# Patient Record
Sex: Female | Born: 1945
Health system: Southern US, Community
[De-identification: ages and names within clinical notes are randomized; demographics above are authoritative.]

## PROBLEM LIST (undated history)

## (undated) DIAGNOSIS — Z860100 Personal history of colon polyps, unspecified: Secondary | ICD-10-CM

## (undated) DIAGNOSIS — J449 Chronic obstructive pulmonary disease, unspecified: Secondary | ICD-10-CM

## (undated) DIAGNOSIS — C52 Malignant neoplasm of vagina: Secondary | ICD-10-CM

## (undated) DIAGNOSIS — K589 Irritable bowel syndrome without diarrhea: Secondary | ICD-10-CM

## (undated) DIAGNOSIS — Z720 Tobacco use: Secondary | ICD-10-CM

## (undated) DIAGNOSIS — Z8739 Personal history of other diseases of the musculoskeletal system and connective tissue: Secondary | ICD-10-CM

## (undated) DIAGNOSIS — I2584 Coronary atherosclerosis due to calcified coronary lesion: Secondary | ICD-10-CM

## (undated) DIAGNOSIS — B354 Tinea corporis: Secondary | ICD-10-CM

## (undated) DIAGNOSIS — M1990 Unspecified osteoarthritis, unspecified site: Secondary | ICD-10-CM

## (undated) DIAGNOSIS — I700 Atherosclerosis of aorta: Secondary | ICD-10-CM

## (undated) DIAGNOSIS — M810 Age-related osteoporosis without current pathological fracture: Secondary | ICD-10-CM

## (undated) DIAGNOSIS — C50919 Malignant neoplasm of unspecified site of unspecified female breast: Secondary | ICD-10-CM

## (undated) DIAGNOSIS — E039 Hypothyroidism, unspecified: Secondary | ICD-10-CM

## (undated) DIAGNOSIS — J329 Chronic sinusitis, unspecified: Secondary | ICD-10-CM

## (undated) DIAGNOSIS — E079 Disorder of thyroid, unspecified: Secondary | ICD-10-CM

## (undated) DIAGNOSIS — F3189 Other bipolar disorder: Secondary | ICD-10-CM

## (undated) DIAGNOSIS — J309 Allergic rhinitis, unspecified: Secondary | ICD-10-CM

## (undated) DIAGNOSIS — I10 Essential (primary) hypertension: Secondary | ICD-10-CM

## (undated) DIAGNOSIS — I2510 Atherosclerotic heart disease of native coronary artery without angina pectoris: Secondary | ICD-10-CM

## (undated) DIAGNOSIS — Z923 Personal history of irradiation: Secondary | ICD-10-CM

## (undated) DIAGNOSIS — R4702 Dysphasia: Secondary | ICD-10-CM

## (undated) DIAGNOSIS — I25110 Atherosclerotic heart disease of native coronary artery with unstable angina pectoris: Secondary | ICD-10-CM

## (undated) DIAGNOSIS — Z8249 Family history of ischemic heart disease and other diseases of the circulatory system: Secondary | ICD-10-CM

## (undated) DIAGNOSIS — K219 Gastro-esophageal reflux disease without esophagitis: Secondary | ICD-10-CM

## (undated) DIAGNOSIS — M109 Gout, unspecified: Secondary | ICD-10-CM

## (undated) HISTORY — DX: Chronic sinusitis, unspecified: J32.9

## (undated) HISTORY — DX: Essential (primary) hypertension: I10

## (undated) HISTORY — DX: Chronic obstructive pulmonary disease, unspecified: J44.9

## (undated) HISTORY — DX: Atherosclerotic heart disease of native coronary artery with unstable angina pectoris: I25.110

## (undated) HISTORY — DX: Personal history of colonic polyps: Z86.010

## (undated) HISTORY — DX: Family history of ischemic heart disease and other diseases of the circulatory system: Z82.49

## (undated) HISTORY — DX: Gastro-esophageal reflux disease without esophagitis: K21.9

## (undated) HISTORY — PX: ABDOMINAL HYSTERECTOMY: SHX81

## (undated) HISTORY — DX: Atherosclerosis of aorta: I70.0

## (undated) HISTORY — DX: Other bipolar disorder: F31.89

## (undated) HISTORY — DX: Personal history of colon polyps, unspecified: Z86.0100

## (undated) HISTORY — DX: Irritable bowel syndrome, unspecified: K58.9

## (undated) HISTORY — DX: Personal history of other diseases of the musculoskeletal system and connective tissue: Z87.39

## (undated) HISTORY — PX: EYE SURGERY: SHX253

## (undated) HISTORY — PX: BREAST SURGERY: SHX581

## (undated) HISTORY — DX: Malignant neoplasm of vagina: C52

## (undated) HISTORY — DX: Gout, unspecified: M10.9

## (undated) HISTORY — PX: COLONOSCOPY: SHX174

## (undated) HISTORY — DX: Tobacco use: Z72.0

## (undated) HISTORY — DX: Malignant neoplasm of unspecified site of unspecified female breast: C50.919

## (undated) HISTORY — DX: Disorder of thyroid, unspecified: E07.9

## (undated) HISTORY — DX: Tinea corporis: B35.4

## (undated) HISTORY — DX: Hypothyroidism, unspecified: E03.9

## (undated) HISTORY — DX: Age-related osteoporosis without current pathological fracture: M81.0

## (undated) HISTORY — DX: Coronary atherosclerosis due to calcified coronary lesion: I25.84

## (undated) HISTORY — DX: Allergic rhinitis, unspecified: J30.9

## (undated) HISTORY — PX: TOOTH EXTRACTION: SUR596

## (undated) HISTORY — DX: Atherosclerotic heart disease of native coronary artery without angina pectoris: I25.10

## (undated) HISTORY — DX: Dysphasia: R47.02

## (undated) HISTORY — PX: OTHER SURGICAL HISTORY: SHX169

---

## 1997-10-20 ENCOUNTER — Other Ambulatory Visit: Admission: RE | Admit: 1997-10-20 | Discharge: 1997-10-20 | Payer: Self-pay | Admitting: *Deleted

## 1997-12-16 ENCOUNTER — Other Ambulatory Visit: Admission: RE | Admit: 1997-12-16 | Discharge: 1997-12-16 | Payer: Self-pay | Admitting: Family Medicine

## 1998-01-06 ENCOUNTER — Encounter: Admission: RE | Admit: 1998-01-06 | Discharge: 1998-04-06 | Payer: Self-pay | Admitting: Family Medicine

## 1998-09-22 ENCOUNTER — Encounter: Payer: Self-pay | Admitting: Occupational Medicine

## 1998-09-22 ENCOUNTER — Ambulatory Visit: Admission: RE | Admit: 1998-09-22 | Discharge: 1998-09-22 | Payer: Self-pay | Admitting: Occupational Medicine

## 1998-11-16 ENCOUNTER — Other Ambulatory Visit: Admission: RE | Admit: 1998-11-16 | Discharge: 1998-11-16 | Payer: Self-pay | Admitting: *Deleted

## 1998-12-24 ENCOUNTER — Encounter: Payer: Self-pay | Admitting: General Surgery

## 1998-12-28 ENCOUNTER — Encounter: Payer: Self-pay | Admitting: General Surgery

## 1998-12-28 ENCOUNTER — Ambulatory Visit (HOSPITAL_COMMUNITY): Admission: RE | Admit: 1998-12-28 | Discharge: 1998-12-28 | Payer: Self-pay | Admitting: General Surgery

## 1999-11-17 ENCOUNTER — Other Ambulatory Visit: Admission: RE | Admit: 1999-11-17 | Discharge: 1999-11-17 | Payer: Self-pay | Admitting: *Deleted

## 2002-10-17 ENCOUNTER — Ambulatory Visit (HOSPITAL_COMMUNITY): Admission: RE | Admit: 2002-10-17 | Discharge: 2002-10-17 | Payer: Self-pay | Admitting: *Deleted

## 2002-10-17 ENCOUNTER — Encounter: Payer: Self-pay | Admitting: Family Medicine

## 2003-12-01 ENCOUNTER — Other Ambulatory Visit: Admission: RE | Admit: 2003-12-01 | Discharge: 2003-12-01 | Payer: Self-pay | Admitting: Family Medicine

## 2004-07-14 ENCOUNTER — Ambulatory Visit: Payer: Self-pay | Admitting: Family Medicine

## 2004-08-03 ENCOUNTER — Ambulatory Visit: Payer: Self-pay | Admitting: Family Medicine

## 2005-04-01 ENCOUNTER — Ambulatory Visit: Payer: Self-pay | Admitting: Family Medicine

## 2005-04-03 ENCOUNTER — Ambulatory Visit: Payer: Self-pay | Admitting: Family Medicine

## 2005-04-11 ENCOUNTER — Encounter: Payer: Self-pay | Admitting: Family Medicine

## 2005-04-11 ENCOUNTER — Other Ambulatory Visit: Admission: RE | Admit: 2005-04-11 | Discharge: 2005-04-11 | Payer: Self-pay | Admitting: Internal Medicine

## 2005-04-11 ENCOUNTER — Ambulatory Visit: Payer: Self-pay | Admitting: Family Medicine

## 2005-08-01 ENCOUNTER — Ambulatory Visit: Payer: Self-pay | Admitting: Family Medicine

## 2005-11-01 ENCOUNTER — Ambulatory Visit: Payer: Self-pay | Admitting: Family Medicine

## 2005-11-28 ENCOUNTER — Ambulatory Visit: Payer: Self-pay | Admitting: Gastroenterology

## 2006-02-01 ENCOUNTER — Ambulatory Visit: Payer: Self-pay | Admitting: Family Medicine

## 2006-02-06 ENCOUNTER — Ambulatory Visit: Payer: Self-pay | Admitting: Family Medicine

## 2006-04-04 ENCOUNTER — Ambulatory Visit: Payer: Self-pay | Admitting: Family Medicine

## 2006-08-14 ENCOUNTER — Encounter: Payer: Self-pay | Admitting: Family Medicine

## 2006-08-14 ENCOUNTER — Ambulatory Visit: Payer: Self-pay | Admitting: Family Medicine

## 2006-08-14 ENCOUNTER — Other Ambulatory Visit: Admission: RE | Admit: 2006-08-14 | Discharge: 2006-08-14 | Payer: Self-pay | Admitting: Family Medicine

## 2006-08-14 LAB — CONVERTED CEMR LAB
ALT: 18 units/L (ref 0–40)
AST: 20 units/L (ref 0–37)
Albumin: 4 g/dL (ref 3.5–5.2)
Alkaline Phosphatase: 69 units/L (ref 39–117)
BUN: 10 mg/dL (ref 6–23)
Basophils Absolute: 0.1 10*3/uL (ref 0.0–0.1)
Basophils Relative: 0.9 % (ref 0.0–1.0)
Bilirubin, Direct: 0.1 mg/dL (ref 0.0–0.3)
CO2: 30 meq/L (ref 19–32)
Calcium: 9.4 mg/dL (ref 8.4–10.5)
Chloride: 106 meq/L (ref 96–112)
Cholesterol: 151 mg/dL (ref 0–200)
Creatinine, Ser: 0.8 mg/dL (ref 0.4–1.2)
Eosinophils Absolute: 0.4 10*3/uL (ref 0.0–0.6)
Eosinophils Relative: 4 % (ref 0.0–5.0)
GFR calc Af Amer: 95 mL/min
GFR calc non Af Amer: 78 mL/min
Glucose, Bld: 96 mg/dL (ref 70–99)
HCT: 42.4 % (ref 36.0–46.0)
HDL: 39.4 mg/dL (ref >39.0)
Hemoglobin: 14.6 g/dL (ref 12.0–15.0)
LDL Cholesterol: 87 mg/dL (ref 0–99)
Lymphocytes Relative: 28.7 % (ref 12.0–46.0)
MCHC: 34.6 g/dL (ref 30.0–36.0)
MCV: 91.4 fL (ref 78.0–100.0)
Monocytes Absolute: 0.9 10*3/uL — ABNORMAL HIGH (ref 0.2–0.7)
Monocytes Relative: 8.7 % (ref 3.0–11.0)
Neutro Abs: 5.9 10*3/uL (ref 1.4–7.7)
Neutrophils Relative %: 57.7 % (ref 43.0–77.0)
Platelets: 206 10*3/uL (ref 150–400)
Potassium: 3.9 meq/L (ref 3.5–5.1)
RBC: 4.63 M/uL (ref 3.87–5.11)
RDW: 12 % (ref 11.5–14.6)
Sodium: 143 meq/L (ref 135–145)
TSH: 3.45 microintl units/mL (ref 0.35–5.50)
Total Bilirubin: 0.8 mg/dL (ref 0.3–1.2)
Total CHOL/HDL Ratio: 3.8
Total Protein: 7.1 g/dL (ref 6.0–8.3)
Triglycerides: 124 mg/dL (ref 0–149)
VLDL: 25 mg/dL (ref 0–40)
WBC: 10.3 10*3/uL (ref 4.5–10.5)

## 2006-08-22 ENCOUNTER — Ambulatory Visit: Payer: Self-pay

## 2006-12-20 ENCOUNTER — Encounter: Payer: Self-pay | Admitting: Family Medicine

## 2006-12-21 DIAGNOSIS — J449 Chronic obstructive pulmonary disease, unspecified: Secondary | ICD-10-CM

## 2006-12-21 DIAGNOSIS — J4489 Other specified chronic obstructive pulmonary disease: Secondary | ICD-10-CM

## 2006-12-21 HISTORY — DX: Chronic obstructive pulmonary disease, unspecified: J44.9

## 2006-12-21 HISTORY — DX: Other specified chronic obstructive pulmonary disease: J44.89

## 2006-12-25 ENCOUNTER — Telehealth: Payer: Self-pay | Admitting: Family Medicine

## 2007-01-09 ENCOUNTER — Ambulatory Visit: Payer: Self-pay | Admitting: Family Medicine

## 2007-01-09 DIAGNOSIS — M109 Gout, unspecified: Secondary | ICD-10-CM | POA: Insufficient documentation

## 2007-01-09 DIAGNOSIS — Z8739 Personal history of other diseases of the musculoskeletal system and connective tissue: Secondary | ICD-10-CM | POA: Insufficient documentation

## 2007-01-09 HISTORY — DX: Personal history of other diseases of the musculoskeletal system and connective tissue: Z87.39

## 2007-01-09 HISTORY — DX: Gout, unspecified: M10.9

## 2007-03-29 ENCOUNTER — Ambulatory Visit: Payer: Self-pay | Admitting: Family Medicine

## 2007-04-11 ENCOUNTER — Telehealth: Payer: Self-pay | Admitting: Family Medicine

## 2007-04-11 ENCOUNTER — Emergency Department (HOSPITAL_COMMUNITY): Admission: EM | Admit: 2007-04-11 | Discharge: 2007-04-11 | Payer: Self-pay | Admitting: Emergency Medicine

## 2007-04-17 ENCOUNTER — Ambulatory Visit: Payer: Self-pay | Admitting: Family Medicine

## 2007-04-17 ENCOUNTER — Telehealth: Payer: Self-pay | Admitting: Family Medicine

## 2007-04-25 ENCOUNTER — Ambulatory Visit: Payer: Self-pay | Admitting: Internal Medicine

## 2007-04-25 ENCOUNTER — Inpatient Hospital Stay (HOSPITAL_COMMUNITY): Admission: EM | Admit: 2007-04-25 | Discharge: 2007-04-27 | Payer: Self-pay | Admitting: Emergency Medicine

## 2007-04-27 ENCOUNTER — Inpatient Hospital Stay (HOSPITAL_COMMUNITY): Admission: AD | Admit: 2007-04-27 | Discharge: 2007-05-27 | Payer: Self-pay | Admitting: Psychiatry

## 2007-04-27 ENCOUNTER — Ambulatory Visit: Payer: Self-pay | Admitting: Psychiatry

## 2007-06-12 ENCOUNTER — Ambulatory Visit: Payer: Self-pay | Admitting: Family Medicine

## 2007-06-18 ENCOUNTER — Telehealth: Payer: Self-pay | Admitting: Family Medicine

## 2007-06-19 ENCOUNTER — Encounter: Payer: Self-pay | Admitting: Family Medicine

## 2007-06-19 ENCOUNTER — Telehealth: Payer: Self-pay | Admitting: Family Medicine

## 2007-12-13 ENCOUNTER — Encounter: Payer: Self-pay | Admitting: Family Medicine

## 2008-02-20 ENCOUNTER — Ambulatory Visit: Payer: Self-pay | Admitting: Family Medicine

## 2008-03-17 ENCOUNTER — Ambulatory Visit: Payer: Self-pay | Admitting: Family Medicine

## 2008-03-17 DIAGNOSIS — F3189 Other bipolar disorder: Secondary | ICD-10-CM

## 2008-03-17 DIAGNOSIS — F319 Bipolar disorder, unspecified: Secondary | ICD-10-CM

## 2008-03-17 HISTORY — DX: Other bipolar disorder: F31.89

## 2008-03-17 LAB — CONVERTED CEMR LAB
Bilirubin Urine: NEGATIVE
Blood in Urine, dipstick: NEGATIVE
Glucose, Urine, Semiquant: NEGATIVE
Nitrite: POSITIVE
Protein, U semiquant: NEGATIVE
Specific Gravity, Urine: 1.015
Urobilinogen, UA: 0.2
pH: 7

## 2008-03-18 ENCOUNTER — Telehealth: Payer: Self-pay | Admitting: Family Medicine

## 2008-03-19 LAB — CONVERTED CEMR LAB
ALT: 16 units/L (ref 0–35)
AST: 16 units/L (ref 0–37)
Albumin: 4.1 g/dL (ref 3.5–5.2)
Alkaline Phosphatase: 72 units/L (ref 39–117)
BUN: 13 mg/dL (ref 6–23)
Basophils Relative: 0.3 % (ref 0.0–3.0)
Bilirubin, Direct: 0.1 mg/dL (ref 0.0–0.3)
CO2: 29 meq/L (ref 19–32)
Calcium: 9.5 mg/dL (ref 8.4–10.5)
Chloride: 110 meq/L (ref 96–112)
Cholesterol: 136 mg/dL (ref 0–200)
Creatinine, Ser: 0.9 mg/dL (ref 0.4–1.2)
Eosinophils Relative: 1.8 % (ref 0.0–5.0)
GFR calc Af Amer: 82 mL/min
GFR calc non Af Amer: 68 mL/min
Glucose, Bld: 97 mg/dL (ref 70–99)
HCT: 45 % (ref 36.0–46.0)
HDL: 31.9 mg/dL — ABNORMAL LOW (ref >39.0)
Hemoglobin: 15.7 g/dL — ABNORMAL HIGH (ref 12.0–15.0)
LDL Cholesterol: 85 mg/dL (ref 0–99)
Lymphocytes Relative: 25 % (ref 12.0–46.0)
MCHC: 35 g/dL (ref 30.0–36.0)
MCV: 91.6 fL (ref 78.0–100.0)
Monocytes Relative: 7.1 % (ref 3.0–12.0)
Neutrophils Relative %: 65.8 % (ref 43.0–77.0)
Platelets: 177 10*3/uL (ref 150–400)
Potassium: 4.8 meq/L (ref 3.5–5.1)
RBC: 4.91 M/uL (ref 3.87–5.11)
RDW: 12 % (ref 11.5–14.6)
Sodium: 147 meq/L — ABNORMAL HIGH (ref 135–145)
TSH: 1.83 microintl units/mL (ref 0.35–5.50)
Total Bilirubin: 0.8 mg/dL (ref 0.3–1.2)
Total CHOL/HDL Ratio: 4.3
Total Protein: 7.2 g/dL (ref 6.0–8.3)
Triglycerides: 95 mg/dL (ref 0–149)
VLDL: 19 mg/dL (ref 0–40)
Vit D, 1,25-Dihydroxy: 29 — ABNORMAL LOW (ref 30–89)
WBC: 10.1 10*3/uL (ref 4.5–10.5)

## 2008-03-31 ENCOUNTER — Encounter: Payer: Self-pay | Admitting: Family Medicine

## 2008-04-01 ENCOUNTER — Ambulatory Visit: Payer: Self-pay | Admitting: Internal Medicine

## 2008-04-01 ENCOUNTER — Encounter: Payer: Self-pay | Admitting: Family Medicine

## 2008-05-19 ENCOUNTER — Ambulatory Visit: Payer: Self-pay | Admitting: Family Medicine

## 2008-07-15 ENCOUNTER — Telehealth: Payer: Self-pay | Admitting: Family Medicine

## 2008-11-10 IMAGING — CR DG CHEST 1V PORT
1 series · 1 of 1 positions shown · non-contrast
Comparison: 04/11/2007.

Portable AP chest, 04/24/2007.
INDICATION: Overdose, unresponsive.

[view not recorded]
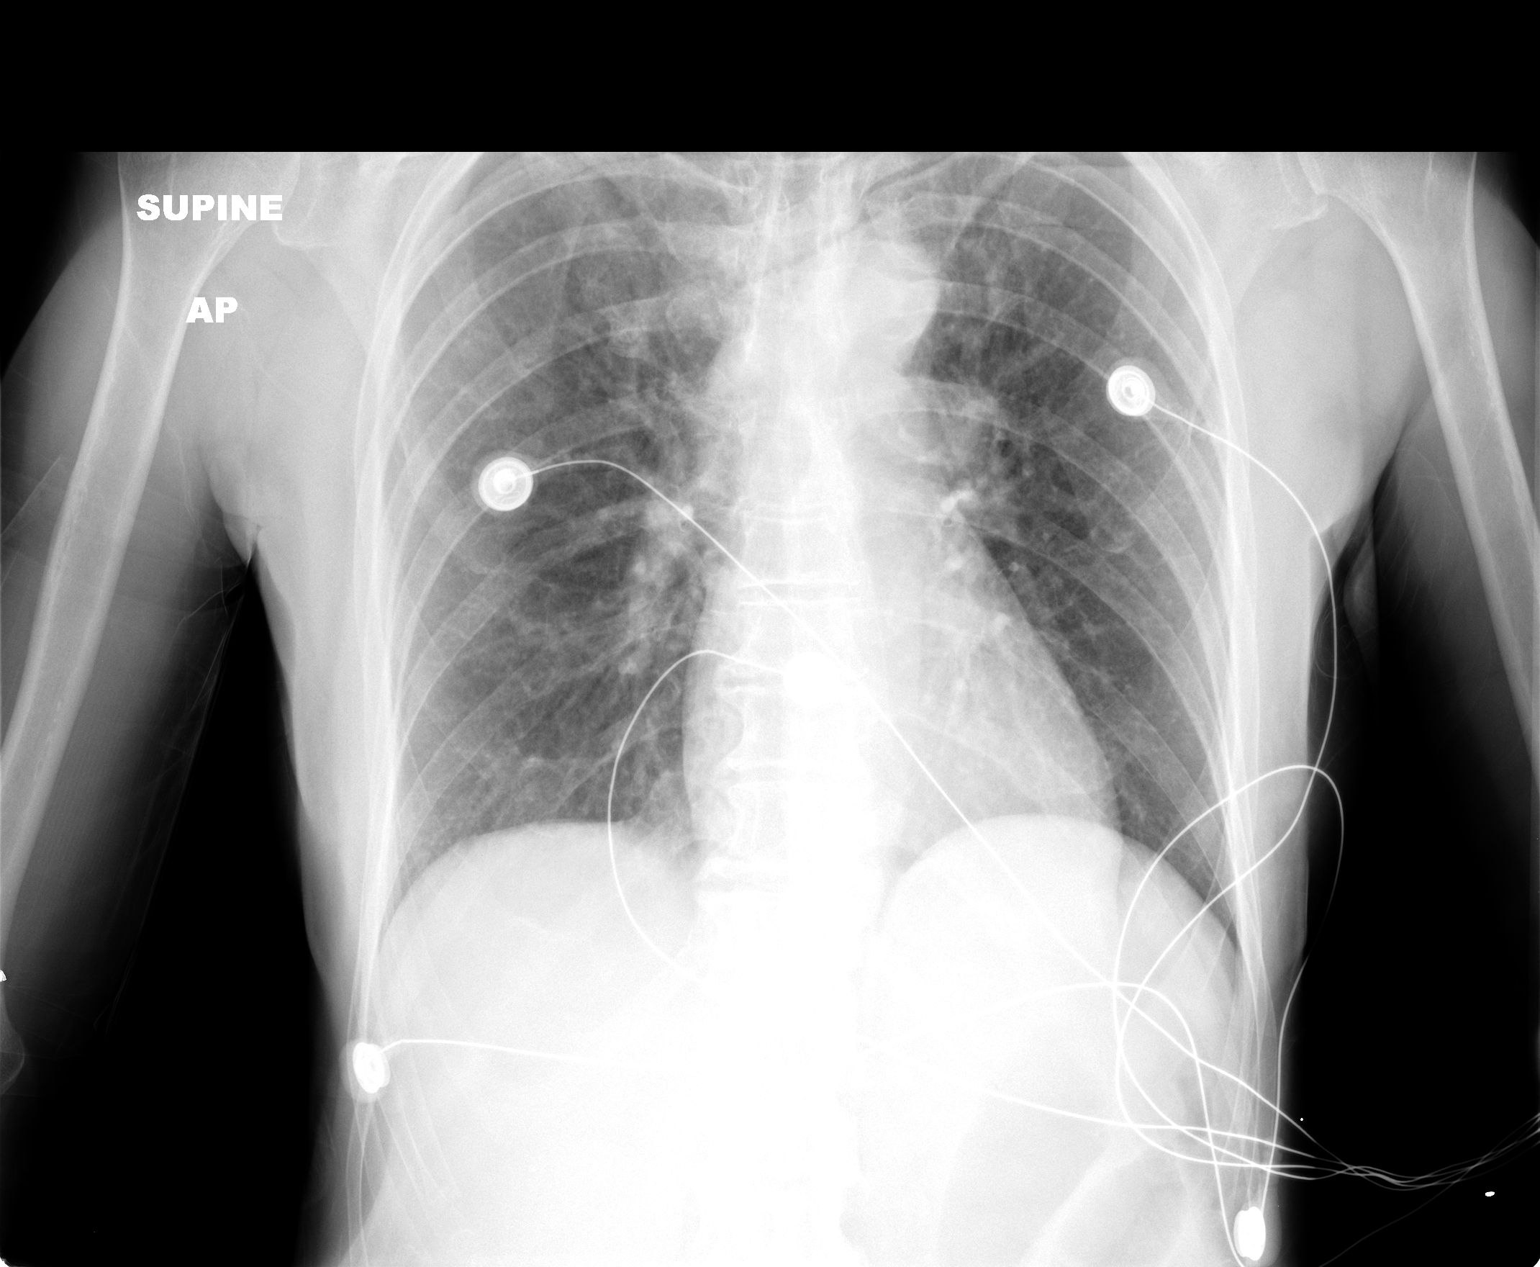

[1 of 1 positions shown; findings below may reference images not displayed]

FINDINGS: COPD. Mildly decreased inspiratory volumes compared to prior.

Linear density is projected over right lung apex. This probably represents a
skin fold or is external to the patient. Pneumothorax is difficult to exclude,
but not favored. Repeat upright frontal view recommended to further assess.
IMPRESSION: 1. Linear density at the right apex. Repeat upright or decubitus (right side up)
chest radiograph recommended to further assess and exclude pneumothorax.

## 2009-06-03 ENCOUNTER — Encounter: Payer: Self-pay | Admitting: Family Medicine

## 2009-10-01 ENCOUNTER — Ambulatory Visit: Payer: Self-pay | Admitting: Family Medicine

## 2009-10-06 ENCOUNTER — Ambulatory Visit: Payer: Self-pay | Admitting: Family Medicine

## 2009-10-06 ENCOUNTER — Other Ambulatory Visit: Admission: RE | Admit: 2009-10-06 | Discharge: 2009-10-06 | Payer: Self-pay | Admitting: Family Medicine

## 2009-10-06 DIAGNOSIS — B354 Tinea corporis: Secondary | ICD-10-CM | POA: Insufficient documentation

## 2009-10-06 HISTORY — DX: Tinea corporis: B35.4

## 2009-10-07 LAB — CONVERTED CEMR LAB
ALT: 18 units/L (ref 0–35)
AST: 20 units/L (ref 0–37)
Albumin: 4.3 g/dL (ref 3.5–5.2)
Alkaline Phosphatase: 89 units/L (ref 39–117)
BUN: 13 mg/dL (ref 6–23)
Basophils Absolute: 0.1 10*3/uL (ref 0.0–0.1)
Basophils Relative: 0.6 % (ref 0.0–3.0)
Bilirubin, Direct: 0.1 mg/dL (ref 0.0–0.3)
Blood in Urine, dipstick: NEGATIVE
CO2: 28 meq/L (ref 19–32)
Calcium: 9.6 mg/dL (ref 8.4–10.5)
Chloride: 106 meq/L (ref 96–112)
Cholesterol: 181 mg/dL (ref 0–200)
Creatinine, Ser: 0.8 mg/dL (ref 0.4–1.2)
Eosinophils Absolute: 0.4 10*3/uL (ref 0.0–0.7)
Eosinophils Relative: 3.1 % (ref 0.0–5.0)
GFR calc non Af Amer: 77.35 mL/min (ref >60)
Glucose, Bld: 87 mg/dL (ref 70–99)
Glucose, Urine, Semiquant: NEGATIVE
HCT: 45.7 % (ref 36.0–46.0)
HDL: 38.6 mg/dL — ABNORMAL LOW (ref >39.00)
Hemoglobin: 15.4 g/dL — ABNORMAL HIGH (ref 12.0–15.0)
LDL Cholesterol: 124 mg/dL — ABNORMAL HIGH (ref 0–99)
Lymphocytes Relative: 24.4 % (ref 12.0–46.0)
Lymphs Abs: 3.1 10*3/uL (ref 0.7–4.0)
MCHC: 33.8 g/dL (ref 30.0–36.0)
MCV: 92 fL (ref 78.0–100.0)
Monocytes Absolute: 0.8 10*3/uL (ref 0.1–1.0)
Monocytes Relative: 6.7 % (ref 3.0–12.0)
Neutro Abs: 8.3 10*3/uL — ABNORMAL HIGH (ref 1.4–7.7)
Neutrophils Relative %: 65.2 % (ref 43.0–77.0)
Nitrite: NEGATIVE
Platelets: 250 10*3/uL (ref 150.0–400.0)
Potassium: 4.3 meq/L (ref 3.5–5.1)
RBC: 4.96 M/uL (ref 3.87–5.11)
RDW: 13.5 % (ref 11.5–14.6)
Sodium: 144 meq/L (ref 135–145)
Specific Gravity, Urine: 1.02
TSH: 5.52 microintl units/mL — ABNORMAL HIGH (ref 0.35–5.50)
Total Bilirubin: 0.5 mg/dL (ref 0.3–1.2)
Total CHOL/HDL Ratio: 5
Total Protein: 7.3 g/dL (ref 6.0–8.3)
Triglycerides: 92 mg/dL (ref 0.0–149.0)
Urobilinogen, UA: 0.2
VLDL: 18.4 mg/dL (ref 0.0–40.0)
WBC Urine, dipstick: NEGATIVE
WBC: 12.8 10*3/uL — ABNORMAL HIGH (ref 4.5–10.5)
pH: 6

## 2009-10-11 LAB — CONVERTED CEMR LAB: Pap Smear: NEGATIVE

## 2009-10-12 ENCOUNTER — Encounter: Payer: Self-pay | Admitting: Family Medicine

## 2009-10-13 ENCOUNTER — Encounter: Payer: Self-pay | Admitting: Family Medicine

## 2009-10-25 ENCOUNTER — Encounter: Payer: Self-pay | Admitting: Family Medicine

## 2009-11-10 ENCOUNTER — Encounter: Payer: Self-pay | Admitting: Family Medicine

## 2009-11-11 ENCOUNTER — Encounter: Payer: Self-pay | Admitting: Family Medicine

## 2009-11-17 ENCOUNTER — Ambulatory Visit: Payer: Self-pay | Admitting: Family Medicine

## 2009-11-23 LAB — CONVERTED CEMR LAB: TSH: 0.13 microintl units/mL — ABNORMAL LOW (ref 0.35–5.50)

## 2009-11-25 ENCOUNTER — Ambulatory Visit: Payer: Self-pay | Admitting: Internal Medicine

## 2009-11-25 DIAGNOSIS — R197 Diarrhea, unspecified: Secondary | ICD-10-CM | POA: Insufficient documentation

## 2009-12-03 LAB — HM COLONOSCOPY

## 2009-12-06 ENCOUNTER — Ambulatory Visit: Payer: Self-pay | Admitting: Internal Medicine

## 2009-12-08 ENCOUNTER — Encounter: Payer: Self-pay | Admitting: Internal Medicine

## 2010-05-03 ENCOUNTER — Encounter: Payer: Self-pay | Admitting: Family Medicine

## 2010-05-26 ENCOUNTER — Encounter: Payer: Self-pay | Admitting: Family Medicine

## 2010-06-12 ENCOUNTER — Encounter: Payer: Self-pay | Admitting: Psychiatry

## 2010-06-23 NOTE — Progress Notes (Signed)
Summary: dentist recommendation  Phone Note Call from Patient   Caller: Patient Call For: Dr. Scotty Court Summary of Call: Pt states she has 2 infected teeth and wants Dr Scotty Court to recommend a dentist. [phone] Initial call taken by: Lynann Beaver CMA,  March 18, 2008 12:17 PM  Follow-up for Phone Call        do not know a dentist to refer her to at this time Follow-up by: Judithann Sheen MD,  March 19, 2008 2:08 PM

## 2010-06-23 NOTE — Assessment & Plan Note (Signed)
Summary: TB TEST/JLS  Nurse Visit    Prior Medications: IBUPROFEN 800 MG  TABS (IBUPROFEN) 1 three times a day after meals CLONAZEPAM ODT 0.5 MG TBDP (CLONAZEPAM) 1 am 2 at bedtime  by Dr Ezzard Flax DIVALPROEX SODIUM 500 MG XR24H-TAB (DIVALPROEX SODIUM) hs LITHIUM CARBONATE 300 MG CAPS (LITHIUM CARBONATE) once daily DICLOFENAC SODIUM 75 MG TBEC (DICLOFENAC SODIUM) 1 two times a day for bursitis Current Allergies: ! HYDROCODONE-ACETAMINOPHEN (HYDROCODONE-ACETAMINOPHEN)   PPD Application    Vaccine Type: PPD    Site: right forearm    Mfr: Sanofi Pasteur    Dose: 0.1 ml    Route: ID    Given by: Raechel Ache, RN    Exp. Date: 11/22/2009    Lot #: [id number]  PPD Results    Date of reading: 05/21/2008    Results: < 5mm    Interpretation: negative   Orders Added: 1)  TB Skin Test Maurine.Goes    ]

## 2010-06-23 NOTE — Letter (Signed)
Summary: Report from Disability Determination Services  Report from Disability Determination Services   Imported By: Maryln Gottron 01/08/2008 15:18:16  _____________________________________________________________________  External Attachment:    Type:   Image     Comment:   External Document

## 2010-06-23 NOTE — Assessment & Plan Note (Signed)
Summary: COUGH/CCM   Vital Signs:  Patient Profile:   65 Years Old Female Weight:      119 pounds Temp:     98.5 degrees F oral BP sitting:   109 / 72  (left arm) Cuff size:   regular  Vitals Entered By: Alfred Levins, CMA (March 29, 2007 2:19 PM)                 Chief Complaint:  pressure around eyes, cough, stress, and loss of appetite.  History of Present Illness: One month of sinus pressure, St, chest congestion, and coughing up green sputum. No fever.  Current Allergies: ! HYDROCODONE-ACETAMINOPHEN (HYDROCODONE-ACETAMINOPHEN)  Past Medical History:    Reviewed history from 12/21/2006 and no changes required:       COPD       Anxiety     Review of Systems      See HPI   Physical Exam  General:     Well-developed,well-nourished,in no acute distress; alert,appropriate and cooperative throughout examination Head:     Normocephalic and atraumatic without obvious abnormalities. No apparent alopecia or balding. Eyes:     No corneal or conjunctival inflammation noted. EOMI. Perrla. Funduscopic exam benign, without hemorrhages, exudates or papilledema. Vision grossly normal. Ears:     External ear exam shows no significant lesions or deformities.  Otoscopic examination reveals clear canals, tympanic membranes are intact bilaterally without bulging, retraction, inflammation or discharge. Hearing is grossly normal bilaterally. Nose:     External nasal examination shows no deformity or inflammation. Nasal mucosa are pink and moist without lesions or exudates. Mouth:     Oral mucosa and oropharynx without lesions or exudates.  Teeth in good repair. Neck:     No deformities, masses, or tenderness noted. Lungs:     Normal respiratory effort, chest expands symmetrically. Lungs are clear to auscultation, no crackles or wheezes.    Impression & Recommendations:  Problem # 1:  SINUSITIS, ACUTE (ICD-461.9)  Her updated medication list for this problem includes:  Biaxin Xl 500 Mg Tb24 (Clarithromycin) .Marland Kitchen... 2 once daily   Complete Medication List: 1)  Diazepam 5 Mg Tabs (Diazepam) .... One tablet by mouth three times a day if needed. for stress. no early refills 2)  Celexa 20 Mg Tabs (Citalopram hydrobromide) 3)  Aspirin 81 Mg Tabs (Aspirin) 4)  Diclofenac Sodium 75 Mg Tbec (Diclofenac sodium) .... Two times a day after meals 5)  Indomethacin 50 Mg Caps (Indomethacin) .... Three times a day  after meals 6)  Biaxin Xl 500 Mg Tb24 (Clarithromycin) .... 2 once daily   Patient Instructions: 1)  add Mucinex as needed . 2)  Please schedule a follow-up appointment as needed.    Prescriptions: BIAXIN XL 500 MG  TB24 (CLARITHROMYCIN) 2 once daily  #20 x 0   Entered and Authorized by:   Nelwyn Salisbury MD   Signed by:   Nelwyn Salisbury MD on 03/29/2007   Method used:   Electron[facility] to ...       [id number]       [address]       [place], [place]  [address]       Ph: [phone] or [phone]       Fax: [phone]   RxID:   [id number]  ]

## 2010-06-23 NOTE — Progress Notes (Signed)
Summary: LMTCB 1/28, left hip normal xr  Phone Note From Other Clinic   Caller: [facility] Radiology Call For: [facility] Summary of Call: Had a left hip and it was normal  Initial call taken by: Roselle Locus,  June 19, 2007 2:59 PM  Follow-up for Phone Call        please call pt and tell her her xr on hip was normal no fracture or dislocation Follow-up by: Pura Spice, RN,  June 19, 2007 5:01 PM  Additional Follow-up for Phone Call Additional follow up Details #1::        LMTCB ..................................................................Marland KitchenSid Falcon LPN  June 19, 2007 5:23 PM     Additional Follow-up for Phone Call Additional follow up Details #2::    Pt. given normal hip xray results. Follow-up by: Lynann Beaver CMA,  June 20, 2007 12:45 PM

## 2010-06-23 NOTE — Procedures (Signed)
Summary: Colonoscopy  Patient: Casey Kirk Note: All result statuses are Final unless otherwise noted.  Tests: (1) Colonoscopy (COL)   COL Colonoscopy           DONE     [facility]     [address]     [place], [place]  [address]           COLONOSCOPY PROCEDURE REPORT           PATIENT:  Casey Kirk, Casey Kirk  MR#:  [id number]     BIRTHDATE:  11-26-1945, 63 yrs. old  GENDER:  female     ENDOSCOPIST:  Wilhemina Bonito. Eda Keys, MD     REF. BY:  Dianna Limbo, M.D.     PROCEDURE DATE:  12/06/2009     PROCEDURE:  Colonoscopy with snare polypectomy x 1     ASA CLASS:  Class II     INDICATIONS:  Routine Risk Screening     MEDICATIONS:   Fentanyl 100 mcg IV, Versed 10 mg IV           DESCRIPTION OF PROCEDURE:   After the risks benefits and     alternatives of the procedure were thoroughly explained, informed     consent was obtained.  Digital rectal exam was performed and     revealed no abnormalities.   The LB [id number] endoscope     was introduced through the anus and advanced to the cecum, which     was identified by both the appendix and ileocecal valve, without     limitations.Time to cecum = 9:27 min. The quality of the prep was     good, using MoviPrep.  The instrument was then slowly withdrawn     (time = 13:08 min) as the colon was fully examined.     <<PROCEDUREIMAGES>>           FINDINGS:  A diminutive polyp was found in the ascending colon.     Polyp was snared without cautery. Retrieval was successful.   This     was otherwise a normal examination of the colon.   Retroflexed     views in the rectum revealed no abnormalities.    The scope was     then withdrawn from the patient and the procedure completed.           COMPLICATIONS:  None     ENDOSCOPIC IMPRESSION:     1) Diminutive polyp in the ascending colon - removed     2) Otherwise normal examination           RECOMMENDATIONS:     1) Repeat colonoscopy in 5 years if polyp adenomatous; otherwise     10  years           ______________________________     Wilhemina Bonito. Eda Keys, MD           CC:  Tawny Asal, MD;The Patient           n.     Rosalie DoctorWilhemina Bonito. Eda Keys at 12/06/2009 05:04 PM           Wille Glaser, [id number]  Note: An exclamation mark (!) indicates a result that was not dispersed into the flowsheet. Document Creation Date: 12/06/2009 5:05 PM _______________________________________________________________________  (1) Order result status: Final Collection or observation date-time: 12/06/2009 16:49 Requested date-time:  Receipt date-time:  Reported date-time:  Referring Physician:   Ordering Physician: Fransico Setters [phone]) Specimen Source:  Source: Launa Grill Order Number: [id number] Lab site:   Appended Document: Colonoscopy recall 5 yrs     Procedures Next Due Date:    Colonoscopy: 12/2014  Appended Document: Colonoscopy reviewed  Appended Document: Colonoscopy revied,, good report

## 2010-06-23 NOTE — Assessment & Plan Note (Signed)
Summary: SWOLLEN INDEX FINGER KNUCKLE   Vital Signs:  Patient Profile:   65 Years Old Female Weight:      133 pounds Temp:     98.2 degrees F oral Pulse rate:   70 / minute BP sitting:   110 / 80  (left arm)  Vitals Entered By: Pura Spice, RN (January 09, 2007 4:13 PM)               Chief Complaint:  has xr disc for rt index finger --gout and arthritis per pt c/o aching in rt arm. Marland Kitchen  History of Present Illness: went to urgent care dx of gout was made by xr  no tx given at that time. viewed disc brought in by pt  hx arthriits previously treated with indomethacin 50mg  three times a day x 1 month then resulme diclofenac75 mg two times a day   Current Allergies: ! HYDROCODONE-ACETAMINOPHEN (HYDROCODONE-ACETAMINOPHEN)      Physical Exam  Extremities:     rt index finger tender and swollen over mp joint    Complete Medication List: 1)  Diazepam 5 Mg Tabs (Diazepam) .... One tablet by mouth three times a day if needed. for stress. no early refills 2)  Celexa 20 Mg Tabs (Citalopram hydrobromide) 3)  Aspirin 81 Mg Tabs (Aspirin) 4)  Diclofenac Sodium 75 Mg Tbec (Diclofenac sodium) .... Two times a day after meals 5)  Indomethacin 50 Mg Caps (Indomethacin) .... Three times a day  after meals     Prescriptions: INDOMETHACIN 50 MG  CAPS (INDOMETHACIN) three times a day  after meals  #90 x 11   Entered and Authorized by:   Judithann Sheen MD   Signed by:   Judithann Sheen MD on 01/11/2007   Method used:   Handwritten   RxID:   [id number]    Medication Administration  Injection # 1:    Medication: Depo- Medrol 80mg     Diagnosis: GOUT (ICD-274.9)    Route: IM    Site: RUOQ gluteus    Exp Date: 10/09    Lot #: [id number]    Mfr: sircor    Comments: 120mg  total given    Patient tolerated injection without complications    Given by: ghudyrn  Injection # 2:    Medication: Depo- Medrol 40mg     Diagnosis: GOUT (ICD-274.9)    Route: IM  Site: RUOQ gluteus    Exp Date: 10/09    Lot #: [id number]  Orders Added: 1)  Est. Patient Level III [03474] 2)  Depo- Medrol 80mg  [J1040] 3)  Depo- Medrol 40mg  [J1030] 4)  Admin of Therapeutic Inj  intramuscular or subcutaneous [90772]

## 2010-06-23 NOTE — Miscellaneous (Signed)
Summary: diazpeam refill  Clinical Lists Changes  Medications: Added new medication of DIAZEPAM 5 MG  TABS (DIAZEPAM) one tablet by mouth three times a day if needed. for stress. NO EARLY REFILLS - Signed Rx of DIAZEPAM 5 MG  TABS (DIAZEPAM) one tablet by mouth three times a day if needed. for stress. NO EARLY REFILLS;  #100 x 2;  Signed;  Entered by: Pura Spice, RN;  Authorized by: Judithann Sheen MD;  Method used: Print then Give to Patient    Prescriptions: DIAZEPAM 5 MG  TABS (DIAZEPAM) one tablet by mouth three times a day if needed. for stress. NO EARLY REFILLS  #100 x 2   Entered by:   Pura Spice, RN   Authorized by:   Judithann Sheen MD   Signed by:   Pura Spice, RN on 12/20/2006   Method used:   Print then Give to Patient   RxID:   [id number]

## 2010-06-23 NOTE — Assessment & Plan Note (Signed)
Summary: SCREEN FOR COLON / diarrhea   History of Present Illness Visit Type: consult  Primary GI MD: Yancey Flemings MD Primary Provider: Judithann Sheen, MD  Requesting Provider: Judithann Sheen, MD Chief Complaint: Consult colon. Pt states had diarrhea due to Boniva and pt denies any GI complaints  History of Present Illness:   65 year old female with a history of COPD, anxiety, bipolar disorder, and hypothyroidism. She presents today regarding screening colonoscopy. Her brother and mother have history of colon polyps. Patient was scheduled for colonoscopy several years back, but canceled. Her only GI symptom is that of diarrhea which began yesterday after initiating Boniva. No bleeding though she does mention having had a dark stool on one occasion 2 years ago and again 2 months ago. She is known to have hemorrhoids. Review of outside laboratories from Oct 01, 2009 reveal hemoglobin 15.4. Normal liver function tests. Mildly elevated TSH at 5.5.   GI Review of Systems      Denies abdominal pain, acid reflux, belching, bloating, chest pain, dysphagia with liquids, dysphagia with solids, heartburn, loss of appetite, nausea, vomiting, vomiting blood, weight loss, and  weight gain.      Reports diarrhea and  hemorrhoids.     Denies anal fissure, black tarry stools, change in bowel habit, constipation, diverticulosis, fecal incontinence, heme positive stool, irritable bowel syndrome, jaundice, light color stool, liver problems, rectal bleeding, and  rectal pain.    Current Medications (verified): 1)  Clonazepam Odt 0.5 Mg Tbdp (Clonazepam) .Marland Kitchen.. 1 Am 2 At Bedtime  By Dr Ezzard Flax 2)  Lithium Carbonate 300 Mg Caps (Lithium Carbonate) .... Once Daily 3)  Lotrisone 1-0.05 % Crea (Clotrimazole-Betamethasone) .... Aplly To Lesion Two Times A Day For 14 Days, If Reoccurs To Repeat 4)  Diclofenac Sodium 75 Mg Tbec (Diclofenac Sodium) .Marland Kitchen.. 1 Two Times A Day Pc For Arthritis or Inflammation 5)   Synthroid 50 Mcg Tabs (Levothyroxine Sodium) .... One Q.d. 6)  Fish Oil   Oil (Fish Oil) .... One Tablet By Mouth Once Daily 7)  Aspirin 81 Mg  Tabs (Aspirin) .... One Tablet By Mouth Once Daily 8)  Multivitamins   Tabs (Multiple Vitamin) .... One Tablet By Mouth Once Daily 9)  Vitamin C 500 Mg  Tabs (Ascorbic Acid) .... One Tablet By Mouth Once Daily 10)  Vitamin D3 400 Unit Tabs (Cholecalciferol) .... One Tablet By Mouth Once Daily  Allergies (verified): 1)  ! Hydrocodone-Acetaminophen (Hydrocodone-Acetaminophen)  Past History:  Past Medical History: Reviewed history from 10/06/2009 and no changes required. COPD Anxiety bipolar Dx  Past Surgical History: Hysterectomy-has overies Breast  bx CA cells at the end of vaigina removed  Family History: Family History of Alcoholism/Addiction Family History of Arthritis Family History Diabetes 1st degree relative Family History High cholesterol Family History Hypertension Family History Ovarian cancer Family History of Sudden Death Family History Uterine cancer Family History of Cardiovascular disorder No FH of Colon Cancer: Family History of Colon Polyps:Mother and Brother   Social History: Reviewed history from 12/21/2006 and no changes required. Retired Current Smoker Alcohol use-no Drug use-no Regular exercise-no  Review of Systems       The patient complains of cough and shortness of breath.  The patient denies allergy/sinus, anemia, anxiety-new, arthritis/joint pain, back pain, blood in urine, breast changes/lumps, change in vision, confusion, coughing up blood, depression-new, fainting, fatigue, fever, headaches-new, hearing problems, heart murmur, heart rhythm changes, itching, menstrual pain, muscle pains/cramps, night sweats, nosebleeds, pregnancy symptoms, skin rash,  sleeping problems, sore throat, swelling of feet/legs, swollen lymph glands, thirst - excessive , urination - excessive , urination changes/pain, urine  leakage, vision changes, and voice change.    Vital Signs:  Patient profile:   65 year old female Height:      63 inches Weight:      141 pounds BMI:     25.07 BSA:     1.67 Pulse rate:   74 / minute Pulse rhythm:   regular BP sitting:   116 / 64  (left arm) Cuff size:   regular  Vitals Entered By: Ok Anis CMA (November 25, 2009 1:57 PM)  Physical Exam  General:  Well developed, well nourished, no acute distress. Head:  Normocephalic and atraumatic. Eyes:  PERRLA, no icterus. Ears:  Normal auditory acuity. Nose:  No deformity, discharge,  or lesions. Mouth:  No deformity or lesions,. Lungs:  Clear throughout to auscultation. Heart:  Regular rate and rhythm; no murmurs, rubs,  or bruits. Abdomen:  Soft, nontender and nondistended. No masses, hepatosplenomegaly or hernias noted. Normal bowel sounds.Rose tattoo right lower quadrant Rectal:  deferred until colonoscopy Msk:  Symmetrical with no gross deformities. Normal posture. Pulses:  Normal pulses noted. Extremities:  No clubbing, cyanosis, edema or deformities noted. Neurologic:  Alert and  oriented x4; Skin:  Intact without significant lesions or rashes. Psych:  Alert and cooperative. Normal mood and affect.   Impression & Recommendations:  Problem # 1:  DIARRHEA (ICD-787.91) one day history of diarrhea. They have been reaction to medication were possibly infectious. Currently doing well. Expectant management. Patient redeveloped symptoms in taking her Boniva again, then she needs to discuss with Dr. Scotty Court other options.  Problem # 2:  FAMILY HX OF COLONIC POLYPS (ICD-V18.51) mother and brother. No details.  Problem # 3:  SPECIAL SCREENING FOR MALIGNANT NEOPLASMS COLON (ICD-V76.51) the patient is an appropriate candidate for screening colonoscopy without contraindication. The nature of the procedure as well as the risks, benefits, and alternatives were reviewed. She understood and agreed to proceed. Movi prep  prescribed. The patient instructed on its use.  Other Orders: Colonoscopy (Colon)  Patient Instructions: 1)  Colonoscopy [facility] 12/06/09 4:00 pm arrive at 3:00 pm 2)  Movi prep instructions given to patient. 3)  Movi prep Rx.sent to pharmacy. 4)  Colonoscopy and Flexible Sigmoidoscopy brochure given.  5)  Copy sent to : Rickard Patience, MD 6)  The medication list was reviewed and reconciled.  All changed / newly prescribed medications were explained.  A complete medication list was provided to the patient / caregiver. Prescriptions: MOVIPREP 100 GM  SOLR (PEG-KCL-NACL-NASULF-NA ASC-C) As per prep instructions.  #1 x 0   Entered by:   Milford Cage NCMA   Authorized by:   Hilarie Fredrickson MD   Signed by:   Milford Cage NCMA on 11/25/2009   Method used:   Electronically to        [facility] [address] (retail)       [phone]       [place], [place]  [address]       Ph: [phone]       Fax: [phone]   RxID:   [id number]

## 2010-06-23 NOTE — Progress Notes (Signed)
Summary: depressed & paranoid  Phone Note Call from Patient   Caller: Casey Kirk, friend, [phone] Call For: st Summary of Call: With her today & she's very depressed & paranoid, thinks police are going to come & arrest her.  Has lost 20# last couple weeks.  Shaking.  Quit her antidepressant last year.  Needs to be evaluated for bipolar or whatever this is.  Request ov today.  [facility] [address]  [place].   Initial call taken by: Rudy Jew, RN,  April 11, 2007 2:19 PM  Follow-up for Phone Call        Go to ER for evaluation for admission to [facility].  Steward Drone and patient aware per Dr. Sharyl Nimrod.   Follow-up by: Rudy Jew, RN,  April 11, 2007 2:25 PM

## 2010-06-23 NOTE — Progress Notes (Signed)
Summary: CAN YOU WORK HER IN   Phone Note Call from Patient Call back at Home Phone [phone]   Caller: PATIENT LIVE Call For: STAFFORD Summary of Call: WAS IN ON NOVEMBER 7TH AND SAW DR Clent Ridges. HE GAVE HER CLARITHROMYCIN FOR COUGH AND SINUS  IT IS NOT HELPING  CAN YOU WORK HER IN TODAY  Initial call taken by: Roselle Locus,  April 17, 2007 8:16 AM  Follow-up for Phone Call        scheduled appt with Dr. Scotty Court this am. Follow-up by: Lynann Beaver CMA,  April 17, 2007 8:54 AM

## 2010-06-23 NOTE — Assessment & Plan Note (Signed)
Summary: SINUSITIS/DM   Vital Signs:  Patient Profile:   65 Years Old Female Weight:      118 pounds Temp:     98.7 degrees F oral BP sitting:   110 / 70  Vitals Entered By: Lynann Beaver CMA (April 17, 2007 11:43 AM)                 Current Allergies: ! HYDROCODONE-ACETAMINOPHEN (HYDROCODONE-ACETAMINOPHEN)     Review of Systems  General      Complains of fatigue, loss of appetite, sweats, and weakness.  Eyes      Denies blurring, discharge, double vision, eye irritation, eye pain, halos, itching, light sensitivity, red eye, vision loss-1 eye, and vision loss-both eyes.  ENT      Complains of nasal congestion, postnasal drainage, and sinus pressure.  CV      Denies bluish discoloration of lips or nails, chest pain or discomfort, difficulty breathing at night, difficulty breathing while lying down, fainting, fatigue, leg cramps with exertion, lightheadness, near fainting, palpitations, shortness of breath with exertion, swelling of feet, swelling of hands, and weight gain.  Resp      Complains of cough and shortness of breath.      hcx copd  GI      Denies abdominal pain, bloody stools, change in bowel habits, constipation, dark tarry stools, diarrhea, excessive appetite, gas, hemorrhoids, indigestion, loss of appetite, nausea, vomiting, vomiting blood, and yellowish skin color.  GU      Denies abnormal vaginal bleeding, decreased libido, discharge, dysuria, genital sores, hematuria, incontinence, nocturia, urinary frequency, and urinary hesitancy.  Psych      Complains of anxiety and depression.   Physical Exam  General:     Well-developed,well-nourished,in no acute distress; alert,appropriate and cooperative throughout examination Head:     Normocephalic and atraumatic without obvious abnormalities. No apparent alopecia or balding. Eyes:     No corneal or conjunctival inflammation noted. EOMI. Perrla. Funduscopic exam benign, without hemorrhages,  exudates or papilledema. Vision grossly normal. Ears:     External ear exam shows no significant lesions or deformities.  Otoscopic examination reveals clear canals, tympanic membranes are intact bilaterally without bulging, retraction, inflammation or discharge. Hearing is grossly normal bilaterally. Nose:     nasal congestion with yellow drainage facial tenderness Mouth:     post nasal drainage Lungs:     decreased breathe spounds exp wheeze Heart:     Normal rate and regular rhythm. S1 and S2 normal without gallop, murmur, click, rub or other extra sounds. Abdomen:     Bowel sounds positive,abdomen soft and non-tender without masses, organomegaly or hernias noted.    Complete Medication List: 1)  Diazepam 5 Mg Tabs (Diazepam) .... One tablet by mouth three times a day if needed. for stress. no early refills 2)  Clarithromycin 500 Mg Tabs (Clarithromycin) .... One bid 3)  Levaquin 750 Mg Tabs (Levofloxacin) .Marland Kitchen.. 1 tab once daily 4)  Ibuprofen 800 Mg Tabs (Ibuprofen) .Marland Kitchen.. 1 three times a day after meals 5)  Advair Diskus 100-50 Mcg/dose Misc (Fluticasone-salmeterol) .Marland Kitchen.. 1 inhalation two times a day      Prescriptions: ADVAIR DISKUS 100-50 MCG/DOSE  MISC (FLUTICASONE-SALMETEROL) 1 inhalation two times a day  #1 x 11   Entered and Authorized by:   Judithann Sheen MD   Signed by:   Judithann Sheen MD on 06/12/2007   Method used:   Print then Give to Patient   RxID:   [id number] IBUPROFEN  800 MG  TABS (IBUPROFEN) 1 three times a day after meals  #90 x 11   Entered and Authorized by:   Judithann Sheen MD   Signed by:   Judithann Sheen MD on 06/12/2007   Method used:   Print then Give to Patient   RxID:   [id number] LEVAQUIN 750 MG  TABS (LEVOFLOXACIN) 1 tab once daily  #7 x 0   Entered and Authorized by:   Judithann Sheen MD   Signed by:   Judithann Sheen MD on 04/17/2007   Method used:   Electron[facility] sent to ...       [id number]       [address]       [place], [place]  [address]       Ph: [phone] or [phone]       Fax: [phone]   RxID:   [id number]  ]

## 2010-06-23 NOTE — Assessment & Plan Note (Signed)
Summary: cpx patient fasting/mhf reschedule with patient /mhf   Vital Signs:  Patient Profile:   65 Years Old Female Weight:      126 pounds O2 Sat:      94 % Temp:     98 degrees F Pulse rate:   82 / minute BP sitting:   110 / 78  (left arm) Cuff size:   regular  Vitals Entered By: Pura Spice, RN (March 17, 2008 9:16 AM)                 Chief Complaint:  cpxseeing psychiatrist now   c/o arms shoulders painful .  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 Ezzard Flax, psychiatrist complains of shoulder  and arm pain for sometime Has copd , continues to smoke, needs pulmonary functiion tests unable to work, to apply for disability    Current Allergies (reviewed today): ! HYDROCODONE-ACETAMINOPHEN (HYDROCODONE-ACETAMINOPHEN)     Review of Systems      See HPI  General      Complains of fatigue, loss of appetite, malaise, sleep disorder, weakness, and weight loss.  ENT      Denies decreased hearing, difficulty swallowing, ear discharge, earache, hoarseness, nasal congestion, nosebleeds, postnasal drainage, ringing in ears, sinus pressure, and sore throat.  CV      Denies bluish discoloration of lips or nails, chest pain or discomfort, difficulty breathing at night, difficulty breathing while lying down, fainting, fatigue, leg cramps with exertion, lightheadness, near fainting, palpitations, shortness of breath with exertion, swelling of feet, swelling of hands, and weight gain.  Resp      See HPI      Complains of cough, shortness of breath, and wheezing.  GI      Denies abdominal pain, bloody stools, change in bowel habits, constipation, dark tarry stools, diarrhea, excessive appetite, gas, hemorrhoids, indigestion, loss of  appetite, nausea, vomiting, vomiting blood, and yellowish skin color.  GU      Denies abnormal vaginal bleeding, decreased libido, discharge, dysuria, genital sores, hematuria, incontinence, nocturia, urinary frequency, and urinary hesitancy.  MS      See HPI      Complains of joint pain and stiffness.  Derm      Denies changes in color of skin, changes in nail beds, dryness, excessive perspiration, flushing, hair loss, insect bite(s), itching, lesion(s), poor wound healing, and rash.  Neuro      See HPI  Psych      Complains of anxiety and depression.      bipolar dx   Physical Exam  General:     , appears older than given age, appears depressedalert and underweight appearing.   Head:     Normocephalic and atraumatic without obvious abnormalities. No apparent alopecia or balding. Eyes:     No corneal or conjunctival inflammation noted. EOMI. Perrla. Funduscopic exam benign, without hemorrhages, exudates or papilledema. Vision grossly normal. Ears:     External ear exam shows no significant lesions or deformities.  Otoscopic examination reveals clear canals, tympanic membranes are intact bilaterally without bulging, retraction, inflammation or discharge. Hearing is grossly normal bilaterally. Nose:     External nasal examination shows no deformity or inflammation. Nasal mucosa are pink and moist without lesions or exudates. Mouth:  Oral mucosa and oropharynx without lesions or exudates.  Teeth in good repair. Neck:     No deformities, masses, or tenderness noted. Chest Wall:     No deformities, masses, or tenderness noted. Breasts:     No mass, nodules, thickening, tenderness, bulging, retraction, inflamation, nipple discharge or skin changes noted.   Lungs:     decreased breathe soundsno accessory muscle use, no dullness, no fremitus, no crackles, and no wheezes.   Heart:     Normal rate and regular rhythm. S1 and S2 normal without gallop, murmur, click, rub or other  extra sounds. Abdomen:     Bowel sounds positive,abdomen soft and non-tender without masses, organomegaly or hernias noted. Rectal:     NE Genitalia:     NE Pap next year Msk:     deltoid bursa tenderness bilaterally Pulses:     R and L carotid,radial,femoral,dorsalis pedis and posterior tibial pulses are full and equal bilaterally Extremities:     No clubbing, cyanosis, edema, or deformity noted with normal full range of motion of all joints.   Neurologic:     No cranial nerve deficits noted. Station and gait are normal. Plantar reflexes are down-going bilaterally. DTRs are symmetrical throughout. Sensory, motor and coordinative functions appear intact. involuntary trmor of lips Skin:     Intact without suspicious lesions or rashes Cervical Nodes:     No lymphadenopathy noted Axillary Nodes:     No palpable lymphadenopathy Inguinal Nodes:     No significant adenopathy Psych:     Oriented X3, depressed affect, and slightly anxious.      Impression & Recommendations:  Problem # 1:  PHYSICAL EXAMINATION (ICD-V70.0) Assessment: New  Orders: Venipuncture ([address]) T-Vitamin D (25-Hydroxy) ([address]) UA Dipstick w/o Micro (automated)  (81003) TLB-Lipid Panel (80061-LIPID) TLB-BMP (Basic Metabolic Panel-BMET) (80048-METABOL) TLB-CBC Platelet - w/Differential (85025-CBCD) TLB-Hepatic/Liver Function Pnl (80076-HEPATIC) TLB-TSH (Thyroid Stimulating Hormone) (84443-TSH)   Problem # 2:  BIPOLAR II DISORDER (ICD-296.89) Assessment: New  Problem # 3:  BURSITIS (ICD-727.3) Assessment: New  Problem # 4:  COPD (ICD-496) Assessment: Unchanged  The following medications were removed from the medication list:    Advair Diskus 100-50 Mcg/dose Misc (Fluticasone-salmeterol) .Marland Kitchen... 1 inhalation two times a day  Orders: Pulmonary Referral (Pulmonary)   Complete Medication List: 1)  Ibuprofen 800 Mg Tabs (Ibuprofen) .Marland Kitchen.. 1 three times a day after meals 2)  Clonazepam Odt 0.5 Mg  Tbdp (Clonazepam) .Marland Kitchen.. 1 am 2 at bedtime  by dr Okey Regal sena 3)  Divalproex Sodium 500 Mg Xr24h-tab (Divalproex sodium) .... Hs 4)  Lithium Carbonate 300 Mg Caps (Lithium carbonate) .... Once daily 5)  Diclofenac Sodium 75 Mg Tbec (Diclofenac sodium) .Marland Kitchen.. 1 two times a day for bursitis   Patient Instructions: 1)  To continue medications as prescribe 2)  diclofenac for shoulders, bursitis, to hyperextend arms three times a day  3)  to order pulmonary function tests 4)  will get advair sple and call   Prescriptions: DICLOFENAC SODIUM 75 MG TBEC (DICLOFENAC SODIUM) 1 two times a day for bursitis  #60 x 11   Entered and Authorized by:   Judithann Sheen MD   Signed by:   Judithann Sheen MD on 03/17/2008   Method used:   [facility]        [facility] [address]  [id number]       [address]       [facility],  [place]  [address]       Ph: [phone] or [phone]       Fax: [phone]   RxID:   [email or web address]  ] Laboratory Results   Urine Tests   Date/Time Reported: March 17, 2008 4:58 PM   Routine Urinalysis   Color: yellow Appearance: Clear Glucose: negative   (Normal Range: Negative) Bilirubin: negative   (Normal Range: Negative) Ketone: 1+   (Normal Range: Negative) Spec. Gravity: 1.015   (Normal Range: 1.003-1.035) Blood: negative   (Normal Range: Negative) pH: 7.0   (Normal Range: 5.0-8.0) Protein: negative   (Normal Range: Negative) Urobilinogen: 0.2   (Normal Range: 0-1) Nitrite: positive   (Normal Range: Negative) Leukocyte Esterace: trace   (Normal Range: Negative)    Comments: Wynona Canes, CMA  March 17, 2008 4:58 PM

## 2010-06-23 NOTE — Assessment & Plan Note (Signed)
Summary: FLU SHOT/MHF  Nurse Visit  Flu Vaccine Consent Questions     Do you have a history of severe allergic reactions to this vaccine? no    Any prior history of allergic reactions to egg and/or gelatin? no    Do you have a sensitivity to the preservative Thimersol? no    Do you have a past history of Guillan-Barre Syndrome? no    Do you currently have an acute febrile illness? no    Have you ever had a severe reaction to latex? no    Vaccine information given and explained to patient? yes    Are you currently pregnant? no    Lot Number:[id number]   Site Given  Left Deltoid IM   Prior Medications: DIAZEPAM 5 MG  TABS (DIAZEPAM) one tablet by mouth three times a day if needed. for stress. NO EARLY REFILLS CLARITHROMYCIN 500 MG  TABS (CLARITHROMYCIN) one bid LEVAQUIN 750 MG  TABS (LEVOFLOXACIN) 1 tab once daily IBUPROFEN 800 MG  TABS (IBUPROFEN) 1 three times a day after meals ADVAIR DISKUS 100-50 MCG/DOSE  MISC (FLUTICASONE-SALMETEROL) 1 inhalation two times a day Current Allergies: ! HYDROCODONE-ACETAMINOPHEN (HYDROCODONE-ACETAMINOPHEN)    Orders Added: 1)  Admin 1st Vaccine [90471] 2)  Flu Vaccine 6yrs + Baden.Dew    ]

## 2010-06-23 NOTE — Letter (Signed)
Summary: Results Follow-up Letter  [place] [facility]  [address] [place], [place] [address]   Phone: [phone]  Fax: [phone]    10/12/2009  [address] [place], [place]  [address]  Dear Ms. Delmundo,   The following are the results of your recent test(s):  Test     Result     Pap Smear    Normal__ yes   _________________________________________________________  Sincerely, Dr Hosie Spangle Stafford,MD      [place] [facility]

## 2010-06-23 NOTE — Miscellaneous (Signed)
Summary: Orders Update pft charges  Clinical Lists Changes  Orders: Added new Service order of Carbon Monoxide diffusing w/capacity (94720) - Signed Added new Service order of Lung Volumes (94240) - Signed Added new Service order of Spirometry (Pre & Post) (94060) - Signed 

## 2010-06-23 NOTE — Letter (Signed)
Summary: [facility] Instructions  [facility]  [address] [place], [place] [address]   Phone: [phone]  Fax: [phone]       Casey Kirk    December 10, 1945    MRN: [id number]        Procedure Day /Date:MONDAY, 12/06/09     Arrival Time:3:00 PM     Procedure Time:4:00 PM     Location of Procedure:                    X  [facility] (4th Floor)  PREPARATION FOR COLONOSCOPY WITH MOVIPREP   Starting 5 days prior to your procedure 12/01/09 do not eat nuts, seeds, popcorn, corn, beans, peas,  salads, or any raw vegetables.  Do not take any fiber supplements (e.g. Metamucil, Citrucel, and Benefiber).  THE DAY BEFORE YOUR PROCEDURE         DATE: 12/05/09  DAY: SUNDAY  1.  Drink clear liquids the entire day-NO SOLID FOOD  2.  Do not drink anything colored red or purple.  Avoid juices with pulp.  No orange juice.  3.  Drink at least 64 oz. (8 glasses) of fluid/clear liquids during the day to prevent dehydration and help the prep work efficiently.  CLEAR LIQUIDS INCLUDE: Water Jello Ice Popsicles Tea (sugar ok, no milk/cream) Powdered fruit flavored drinks Coffee (sugar ok, no milk/cream) Gatorade Juice: apple, white grape, white cranberry  Lemonade Clear bullion, consomm, broth Carbonated beverages (any kind) Strained chicken noodle soup Hard Candy                             4.  In the morning, mix first dose of MoviPrep solution:    Empty 1 Pouch A and 1 Pouch B into the disposable container    Add lukewarm drinking water to the top line of the container. Mix to dissolve    Refrigerate (mixed solution should be used within 24 hrs)  5.  Begin drinking the prep at 5:00 p.m. The MoviPrep container is divided by 4 marks.   Every 15 minutes drink the solution down to the next mark (approximately 8 oz) until the full liter is complete.   6.  Follow completed prep with 16 oz of clear liquid of your choice (Nothing red or purple).  Continue to  drink clear liquids until bedtime.  7.  Before going to bed, mix second dose of MoviPrep solution:    Empty 1 Pouch A and 1 Pouch B into the disposable container    Add lukewarm drinking water to the top line of the container. Mix to dissolve    Refrigerate  THE DAY OF YOUR PROCEDURE      DATE: 12/06/09 DAY: MONDAY  Beginning at 11:00 a.m. (5 hours before procedure):         1. Every 15 minutes, drink the solution down to the next mark (approx 8 oz) until the full liter is complete.  2. Follow completed prep with 16 oz. of clear liquid of your choice.    3. You may drink clear liquids until 2:00 PM (2 HOURS BEFORE PROCEDURE).   MEDICATION INSTRUCTIONS  Unless otherwise instructed, you should take regular prescription medications with a small sip of water   as early as possible the morning of your procedure.         OTHER INSTRUCTIONS  You will need a responsible adult at least 63 years of age to  accompany you and drive you home.   This person must remain in the waiting room during your procedure.  Wear loose fitting clothing that is easily removed.  Leave jewelry and other valuables at home.  However, you may wish to bring a book to read or  an iPod/MP3 player to listen to music as you wait for your procedure to start.  Remove all body piercing jewelry and leave at home.  Total time from sign-in until discharge is approximately 2-3 hours.  You should go home directly after your procedure and rest.  You can resume normal activities the  day after your procedure.  The day of your procedure you should not:   Drive   Make legal decisions   Operate machinery   Drink alcohol   Return to work  You will receive specific instructions about eating, activities and medications before you leave.    The above instructions have been reviewed and explained to me by   _______________________    I fully understand and can verbalize these instructions  _____________________________ Date _________

## 2010-06-23 NOTE — Letter (Signed)
Summary: Patient Notice- Polyp Results  [facility]  [address] [place], [place] [address]   Phone: [phone]  Fax: [phone]        December 08, 2009 MRN: [id number]    [facility] [address] [place], [place]  [address]    Dear Ms. Verbeke,  I am pleased to inform you that the colon polyp(s) removed during your recent colonoscopy was (were) found to be benign (no cancer detected) upon pathologic examination.  I recommend you have a repeat colonoscopy examination in 5 years to look for recurrent polyps, as having colon polyps increases your risk for having recurrent polyps or even colon cancer in the future.  Should you develop new or worsening symptoms of abdominal pain, bowel habit changes or bleeding from the rectum or bowels, please schedule an evaluation with either your primary care physician or with me.  Additional information/recommendations:  __ No further action with gastroenterology is needed at this time. Please      follow-up with your primary care physician for your other healthcare      needs.   Please call us if you are having persistent problems or have questions about your condition that have not been fully answered at this time.  Sincerely,  Hilarie Fredrickson MD  This letter has been electronically signed by your physician.  Appended Document: Patient Notice- Polyp Results letter mailed.

## 2010-06-23 NOTE — Assessment & Plan Note (Signed)
Summary: cpx/njr   Vital Signs:  Patient profile:   65 year old female Height:      63 inches Weight:      143 pounds BMI:     25.42 O2 Sat:      92 % Temp:     98.6 degrees F Pulse rate:   68 / minute Pulse rhythm:   regular BP sitting:   114 / 72  (left arm)  Vitals Entered By: Pura Spice, RN (Oct 06, 2009 2:04 PM) CC: go over meds and problems needs pap    History of Present Illness: this 65 year old white old female is in for a physical examination and evaluation of her thyroid status. She has been declared disabled and is now on Medicare and this will be her first year of physical exam for Medicare. he was alert disabled due to her psychological problem being bipolar and unable to function she is under the care of a psychiatrist at the [facility] medical department She is on lithium and lab studies of her TSH revealed elevation. She is in now for reevaluation and on her recent lab studies here revealed a TSH to be 5.5 to This patient is a hysterectomy in 1979 and was found to have a total vaginal carcinoma approximate 7 years ago with removal of the posterior portion of the vagina with good results Pap smear have been normal since that time She relates she is doing her well on her medication Continues to smoke one pack per day This note had a colonoscopic exam but will arrange this  Preventive Screening-Counseling & Management  Alcohol-Tobacco     Smoking Status: current     Packs/Day: 1.0     Year Started: 1969  EKG  Procedure date:  10/06/2009  Findings:      l sinus rhythm with rate of:  65 sinus rhythm  normal ecg   Allergies: 1)  ! Hydrocodone-Acetaminophen (Hydrocodone-Acetaminophen)  Past History:  Past Surgical History: Last updated: 12/21/2006 Hysterectomy-has overies Breas bx CA cells at the end of vigina removed  Social History: Last updated: 12/21/2006 Retired Current Smoker Alcohol use-no Drug use-no Regular exercise-no  Risk  Factors: Smoking Status: current (10/06/2009) Packs/Day: 1.0 (10/06/2009)  Past Medical History: COPD Anxiety bipolar Dx  Past History:  Care Management: Psychiatry: [facility]   Social History: Packs/Day:  1.0  Review of Systems  The patient denies anorexia, fever, weight loss, weight gain, vision loss, decreased hearing, hoarseness, chest pain, syncope, dyspnea on exertion, peripheral edema, prolonged cough, headaches, hemoptysis, abdominal pain, melena, hematochezia, severe indigestion/heartburn, hematuria, incontinence, genital sores, muscle weakness, suspicious skin lesions, transient blindness, difficulty walking, depression, unusual weight change, abnormal bleeding, enlarged lymph nodes, angioedema, breast masses, and testicular masses.    Physical Exam  General:  Well-developed,well-nourished,in no acute distress; alert,appropriate and cooperative throughout examination Head:  Normocephalic and atraumatic without obvious abnormalities. No apparent alopecia or balding. Eyes:  No corneal or conjunctival inflammation noted. EOMI. Perrla. Funduscopic exam benign, without hemorrhages, exudates or papilledema. Vision grossly normal. Ears:  External ear exam shows no significant lesions or deformities.  Otoscopic examination reveals clear canals, tympanic membranes are intact bilaterally without bulging, retraction, inflammation or discharge. Hearing is grossly normal bilaterally. Nose:  External nasal examination shows no deformity or inflammation. Nasal mucosa are pink and moist without lesions or exudates. Mouth:  Oral mucosa and oropharynx without lesions or exudates.  Teeth in good repair. Neck:  No deformities, masses, or tenderness noted. Chest  Wall:  No deformities, masses, or tenderness noted. Breasts:  No mass, nodules, thickening, tenderness, bulging, retraction, inflamation, nipple discharge or skin changes noted.   Lungs:  Normal respiratory effort,  chest expands symmetrically. Lungs are clear to auscultation, no crackles or wheezes. Heart:  Normal rate and regular rhythm. S1 and S2 normal without gallop, murmur, click, rub or other extra sounds. Abdomen:  Bowel sounds positive,abdomen soft and non-tender without masses, organomegaly or hernias noted. Rectal:  No external abnormalities noted. Normal sphincter tone. No rectal masses or tenderness. Genitalia:  absent uterus, vafginal mucosa normal Msk:  arthritic thum rt, tender rt wrist Pulses:  R and L carotid,radial,femoral,dorsalis pedis and posterior tibial pulses are full and equal bilaterally Extremities:  No clubbing, cyanosis, edema, or deformity noted with normal full range of motion of all joints.   Neurologic:  No cranial nerve deficits noted. Station and gait are normal. Plantar reflexes are down-going bilaterally. DTRs are symmetrical throughout. Sensory, motor and coordinative functions appear intact. Skin:  erythematous scaly rash 5x 7 cm Cervical Nodes:  No lymphadenopathy noted Axillary Nodes:  No palpable lymphadenopathy Inguinal Nodes:  No significant adenopathy Psych:  Cognition and judgment appear intact. Alert and cooperative with normal attention span and concentration. No apparent delusions, illusions, hallucinations   Impression & Recommendations:  Problem # 1:  PHYSICAL EXAMINATION (ICD-V70.0) Assessment New 1st medicare examination  Problem # 2:  TINEA CORPORIS (ICD-110.5) Assessment: New lotrisone two times a day for 14 days  Problem # 5:  BIPOLAR II DISORDER (ICD-296.89) Assessment: Improved Lithium  Problem # 6:  ARTHRITIS, HX OF (ICD-V13.4) Assessment: Deteriorated diclofwenac 75  mg  two times a day pc  Problem # 7:  COPD (ICD-496) Assessment: Unchanged cessation smoking, to do  pulmonary  function test  Complete Medication List: 1)  Clonazepam Odt 0.5 Mg Tbdp (Clonazepam) .Marland Kitchen.. 1 am 2 at bedtime  by dr Okey Regal sena 2)  Lithium Carbonate 300 Mg  Caps (Lithium carbonate) .... Once daily 3)  Lotrisone 1-0.05 % Crea (Clotrimazole-betamethasone) .... Aplly to lesion two times a day for 14 days, if reoccurs to repeat 4)  Diclofenac Sodium 75 Mg Tbec (Diclofenac sodium) .Marland Kitchen.. 1 two times a day pc for arthritis or inflammation 5)  Synthroid 75 Mcg Tabs (Levothyroxine sodium) .Marland Kitchen.. 1 once daily for hypothyroidism  Other Orders: Gastroenterology Referral (GI)  Patient Instructions: 1)  toschedule colonoscopic exam, routine 2)  synthrois 75 micrograms for hypothyroidism 3)  return  for TSH in 6 weeks 4)  continue going to mental health, continue lithium 5)  diclofenac 75mg  two times a day 6)  after meals for arthritis 7)  I.apply lotrison to rash 2 tabs daily for 14 days Prescriptions: SYNTHROID 75 MCG TABS (LEVOTHYROXINE SODIUM) 1 once daily for hypothyroidism  #30 x 11   Entered and Authorized by:   Judithann Sheen MD   Signed by:   Judithann Sheen MD on 10/06/2009   Method used:   Electronically to        [facility] [address] [facility] (retail)       [phone]       [place], [place]  [address]       Ph: [phone]       Fax: [phone]   RxID:   [id number] DICLOFENAC SODIUM 75 MG TBEC (DICLOFENAC SODIUM) 1 two times a day pc for arthritis or inflammation  #60 x 11   Entered and  Authorized by:   Judithann Sheen MD   Signed by:   Judithann Sheen MD on 10/06/2009   Method used:   Electronically to        [facility] [address] [place] (retail)       [phone]       [place], [place]  [address]       Ph: [phone]       Fax: [phone]   RxID:   [id number] LOTRISONE 1-0.05 % CREA (CLOTRIMAZOLE-BETAMETHASONE) aplly to lesion two times a day for 14 days, if reoccurs to repeat  #30 gms x 1   Entered and Authorized by:   Judithann Sheen MD   Signed by:   Judithann Sheen MD on 10/06/2009   Method used:   Electronically to         [facility] [address] [place] (retail)       [phone]       [place], [place]  [address]       Ph: [phone]       Fax: [phone]   RxID:   [id number]

## 2010-06-23 NOTE — Progress Notes (Signed)
Summary: swollen finger  Phone Note Call from Patient   Summary of Call: swollen knuckle index finger right hand.  can you send med to help with pain & inflammation?  set appt for 8-20.  [facility] [employer]/allergic to codeine/hydrocodeine call back to [phone] LM Initial call taken by: Rudy Jew, RN,  December 25, 2006 9:29 AM  Follow-up for Phone Call        CALL IN DICLOFENCA 75 MG A. two times a day AFTER MEALS . #60 REFILL X 5 Follow-up by: Judithann Sheen MD,  December 25, 2006 2:00 PM  Additional Follow-up for Phone Call Additional follow up Details #1::        medication called in & patient advised, left message Additional Follow-up by: Rudy Jew, RN,  December 25, 2006 2:13 PM

## 2010-08-04 ENCOUNTER — Encounter: Payer: Self-pay | Admitting: Family Medicine

## 2010-08-05 ENCOUNTER — Ambulatory Visit: Payer: Self-pay | Admitting: Family Medicine

## 2010-08-08 ENCOUNTER — Encounter: Payer: Self-pay | Admitting: Internal Medicine

## 2010-08-08 ENCOUNTER — Ambulatory Visit (INDEPENDENT_AMBULATORY_CARE_PROVIDER_SITE_OTHER): Payer: Medicare PPO | Admitting: Internal Medicine

## 2010-08-08 DIAGNOSIS — J40 Bronchitis, not specified as acute or chronic: Secondary | ICD-10-CM

## 2010-08-08 MED ORDER — DOXYCYCLINE HYCLATE 100 MG PO TABS: 100.0000 mg | ORAL_TABLET | Freq: Two times a day (BID) | ORAL | Status: AC

## 2010-08-13 ENCOUNTER — Encounter: Payer: Self-pay | Admitting: Internal Medicine

## 2010-08-13 DIAGNOSIS — J40 Bronchitis, not specified as acute or chronic: Secondary | ICD-10-CM | POA: Insufficient documentation

## 2010-08-13 NOTE — Progress Notes (Signed)
  Subjective:    Patient ID: Casey Kirk, female    DOB: 12/24/1945, 65 y.o.   MRN: [id number]  HPI Pt presents to clinic for evaluation of cough. Notes 1.5wk h/o ST, cough, LG fever without chills and + sick exposure. Denies wheezing or dyspnea.No alleviating or exacerbating factors. No other complaints.  Reviewed pmh, medications and allergies.    Review of Systems  Constitutional: Positive for fever. Negative for chills.  HENT: Positive for congestion and rhinorrhea. Negative for hearing loss and ear discharge.   Respiratory: Positive for cough. Negative for chest tightness, shortness of breath and wheezing.   Skin: Negative for color change, pallor and rash.       Objective:   Physical Exam  Nursing note and vitals reviewed. Constitutional: She appears well-developed and well-nourished. No distress.  HENT:  Head: Normocephalic and atraumatic.  Right Ear: Tympanic membrane, external ear and ear canal normal.  Left Ear: Tympanic membrane, external ear and ear canal normal.  Nose: Nose normal.  Mouth/Throat: Oropharynx is clear and moist. No oropharyngeal exudate.  Eyes: Conjunctivae are normal. Right eye exhibits no discharge. Left eye exhibits no discharge. No scleral icterus.  Neck: Neck supple.  Cardiovascular: Normal rate, regular rhythm and normal heart sounds.   Pulmonary/Chest: Effort normal and breath sounds normal.  Lymphadenopathy:    She has no cervical adenopathy.  Neurological: She is alert.  Skin: Skin is warm and dry. No rash noted. She is not diaphoretic. No erythema.          Assessment & Plan:

## 2010-08-13 NOTE — Assessment & Plan Note (Signed)
Begin doxycycline as directed. Followup if no improvement or worsening.

## 2010-09-22 ENCOUNTER — Other Ambulatory Visit: Payer: Self-pay | Admitting: Family Medicine

## 2010-09-22 ENCOUNTER — Other Ambulatory Visit (INDEPENDENT_AMBULATORY_CARE_PROVIDER_SITE_OTHER): Payer: Medicare PPO | Admitting: Family Medicine

## 2010-09-22 DIAGNOSIS — Z0000 Encounter for general adult medical examination without abnormal findings: Secondary | ICD-10-CM

## 2010-09-22 DIAGNOSIS — E875 Hyperkalemia: Secondary | ICD-10-CM

## 2010-09-22 DIAGNOSIS — Z79899 Other long term (current) drug therapy: Secondary | ICD-10-CM

## 2010-09-22 DIAGNOSIS — E039 Hypothyroidism, unspecified: Secondary | ICD-10-CM

## 2010-09-22 LAB — HEPATIC FUNCTION PANEL
ALT: 15 U/L (ref 0–35)
AST: 17 U/L (ref 0–37)
Albumin: 4.1 g/dL (ref 3.5–5.2)
Alkaline Phosphatase: 80 U/L (ref 39–117)
Bilirubin, Direct: 0.1 mg/dL (ref 0.0–0.3)
Total Bilirubin: 0.4 mg/dL (ref 0.3–1.2)
Total Protein: 7.2 g/dL (ref 6.0–8.3)

## 2010-09-22 LAB — CBC WITH DIFFERENTIAL/PLATELET
Basophils Absolute: 0.1 10*3/uL (ref 0.0–0.1)
Basophils Relative: 0.7 % (ref 0.0–3.0)
Eosinophils Absolute: 0.3 10*3/uL (ref 0.0–0.7)
Eosinophils Relative: 2.7 % (ref 0.0–5.0)
HCT: 43.7 % (ref 36.0–46.0)
Hemoglobin: 14.6 g/dL (ref 12.0–15.0)
Lymphocytes Relative: 27.8 % (ref 12.0–46.0)
Lymphs Abs: 2.7 10*3/uL (ref 0.7–4.0)
MCHC: 33.4 g/dL (ref 30.0–36.0)
MCV: 92.6 fl (ref 78.0–100.0)
Monocytes Absolute: 0.7 10*3/uL (ref 0.1–1.0)
Monocytes Relative: 7.7 % (ref 3.0–12.0)
Neutro Abs: 5.9 10*3/uL (ref 1.4–7.7)
Neutrophils Relative %: 61.1 % (ref 43.0–77.0)
Platelets: 193 10*3/uL (ref 150.0–400.0)
RBC: 4.72 Mil/uL (ref 3.87–5.11)
RDW: 14 % (ref 11.5–14.6)
WBC: 9.6 10*3/uL (ref 4.5–10.5)

## 2010-09-22 LAB — POCT URINALYSIS DIPSTICK
Bilirubin, UA: NEGATIVE
Blood, UA: NEGATIVE
Glucose, UA: NEGATIVE
Ketones, UA: NEGATIVE
Leukocytes, UA: NEGATIVE
Nitrite, UA: NEGATIVE
Protein, UA: NEGATIVE
Spec Grav, UA: 1.01
Urobilinogen, UA: 0.2
pH, UA: 6

## 2010-09-22 LAB — LIPID PANEL
Cholesterol: 182 mg/dL (ref 0–200)
HDL: 36.8 mg/dL — ABNORMAL LOW (ref >39.00)
LDL Cholesterol: 125 mg/dL — ABNORMAL HIGH (ref 0–99)
Total CHOL/HDL Ratio: 5
Triglycerides: 103 mg/dL (ref 0.0–149.0)
VLDL: 20.6 mg/dL (ref 0.0–40.0)

## 2010-09-22 LAB — BASIC METABOLIC PANEL
BUN: 12 mg/dL (ref 6–23)
CO2: 26 mEq/L (ref 19–32)
Calcium: 9.7 mg/dL (ref 8.4–10.5)
Chloride: 109 mEq/L (ref 96–112)
Creatinine, Ser: 0.8 mg/dL (ref 0.4–1.2)
GFR: 72.88 mL/min (ref >60.00)
Glucose, Bld: 96 mg/dL (ref 70–99)
Potassium: 6.4 mEq/L (ref 3.5–5.1)
Sodium: 143 mEq/L (ref 135–145)

## 2010-09-22 LAB — TSH: TSH: 3.1 u[IU]/mL (ref 0.35–5.50)

## 2010-09-22 MED ORDER — HYDROCHLOROTHIAZIDE 25 MG PO TABS: ORAL_TABLET | ORAL | Status: DC

## 2010-09-23 ENCOUNTER — Telehealth: Payer: Self-pay | Admitting: *Deleted

## 2010-09-23 NOTE — Telephone Encounter (Signed)
Pt was found to have a high potassium, and Dr. Scotty Court sent in a med to [facility] yesterday.  Pt wants to know if she can take Lithium with this new med.

## 2010-09-27 ENCOUNTER — Other Ambulatory Visit (INDEPENDENT_AMBULATORY_CARE_PROVIDER_SITE_OTHER): Payer: Medicare PPO | Admitting: Family Medicine

## 2010-09-27 DIAGNOSIS — E875 Hyperkalemia: Secondary | ICD-10-CM

## 2010-09-27 LAB — POTASSIUM: Potassium: 4 mEq/L (ref 3.5–5.1)

## 2010-09-28 NOTE — Telephone Encounter (Signed)
Dr. Scotty Court spoke with pt on this matter

## 2010-09-29 NOTE — Telephone Encounter (Signed)
Ok to take lithium, called pt

## 2010-09-29 NOTE — Progress Notes (Signed)
Quick Note:  Spoke with pt about lab results ______ 

## 2010-10-04 NOTE — Consult Note (Signed)
[name], [name] NO.:  [id number]   MEDICAL RECORD NO.:  [id number]          PATIENT TYPE:  INP   LOCATION:  1238                         FACILITY:  [facility]   PHYSICIAN:  Antonietta Breach, M.D.  DATE OF BIRTH:  02/09/1946   DATE OF CONSULTATION:  04/25/2007  DATE OF DISCHARGE:                                 CONSULTATION   REASON FOR STAT:  Facilitating further care.   REASON FOR CONSULT:  1. Psychosis.  2. Depression.  3. Status post overdose.   HISTORY OF PRESENT ILLNESS:  Casey Kirk is a 65 year old female  admitted to [facility] on the 3rd of December, 2008, after an  overdose on Tylenol.   The patient's Tylenol level was over 100.  Her SGOT is elevated.  Her  SGPT increased to 1102 and is now at 975.   The patient is not able to provide a history at this time.  In review of  the record and other history sources, the patient reported to the  emergency room on the 20th of November with a complaint of depression.   She has been making illogical and other comments which reflect paranoia.  She told her attending physician today that she cannot perform mental  status questions because of the pill that was given her.  She told the  attending physician that she was from the police department when the  attending physician was clearly dressed in standard doctor clothing  including a white coat and was performing standard attending medical  physician functions.   The patient is refusing all medications at this time.  She has stated to  the staff that one of the pills has caused her mental dysfunction.  The  duration of this paranoia is estimated at 3 days.  It is unclear how  well the patient is oriented.  She does appear to be oriented to self.  Also, she does appear to be able to retain memory based upon the fact  that her paranoid content contains events that have occurred in the ICU.   She is not combative.   PAST PSYCHIATRIC HISTORY:   There is no known other psychiatric history  besides what can be obtained in the medical record.   In review of the past medical record, the only reference to past  psychiatric history is the report from the emergency visit mentioned  above.  The patient is a resistant historian at this time.   FAMILY PSYCHIATRIC HISTORY:  None known.   SOCIAL HISTORY:  1. Casey Kirk is listed as divorced.  2. Her residence is in [place].   She will not provide any additional information.  It is unclear whether  the demographic information in the record is correct.   PAST MEDICAL HISTORY:  Acetaminophen overdose with hepatotoxicity.  The  patient is currently on the acetylcysteine protocol.   She has no known drug allergies.   MEDICATIONS:  The MAR is reviewed.  The patient is on the acetylcysteine  protocol.  She is on Ativan p.r.n.  She is not accepting medications.   LABORATORY  DATA:  Sodium 142, BUN 8, creatinine 0.63, aspirin negative,  alcohol negative, hCG was negative on the 3rd of December.  WBC 9.4,  hemoglobin 14.9, platelet count 180,000.  Urine drug screen was positive  for benzodiazepines, SGOT 60, SGPT is currently 975, this is decreased  from 1102.   REVIEW OF SYSTEMS:  The patient will not provide.  The review of systems  is limited, therefore.  CONSTITUTIONAL:  Afebrile, no known weight loss.  HEAD:  No trauma.  EYES:  No visual changes.  EARS:  No hearing  impairment.  NOSE:  No rhinorrhea.  MOUTH/THROAT:  No known sore throat.  NEUROLOGIC:  No known focal motor or sensory changes.  PSYCHIATRIC:  As  above.  CARDIOVASCULAR:  No complaints of chest pain, no palpitations.  The patient is not tachycardic.  QTc by telemetry 460 milliseconds.  RESPIRATORY:  No current coughing or wheezing.  GASTROINTESTINAL:  No  vomiting.  GENITOURINARY:  The patient has been complaining about  catheterization.  ENDOCRINE/METABOLIC:  No known heat or cold  intolerance.  MUSCULOSKELETAL:  No  deformities.  HEMATOLOGIC/LYMPHATIC:  Unremarkable.   EXAMINATION:  VITAL SIGNS:  Temperature 98.5, pulse 81, respiratory rate  18, blood pressure 141/75, O2 saturation on 2L 97%.  GENERAL APPEARANCE:  [name] is a middle-aged female sitting up in  her hospital bed with no abnormal involuntary movements.  OTHER MENTAL STATUS EXAM:  The patient is alert.  Her eye contact is  good.  Her attention span is grossly intact.  She will not provide  orientation answers.  She is oriented to self.  MEMORY:  She will not respond to formal memory questions.  FUND OF KNOWLEDGE AND INTELLIGENCE:  Grossly within normal limits.  SPEECH:  There is a flat prosody.  There is no dysarthria.  There is  poverty of speech.  THOUGHT PROCESS:  Coherent; however, there is poverty of speech, so  therefore there is limited information.  She does not appear to have  looseness of associations.  Her language, expression and comprehension  do appear to be intact.  She will not cooperate with abstraction  testing.  THOUGHT CONTENT:  Clearly paranoid with guarded affect and anxious mood.  It is unclear whether she is having internal stimulation with  hallucinations.  Insight is poor, judgment is impaired.   ASSESSMENT:  Axis I:  293.81, psychotic disorder, not otherwise  specified, with delusions of rule out 293.00, delirium not otherwise  specified, rule out major depression.  Axis II:  Deferred.  Axis III:  See General Medical Section.  Axis IV:  General medical others unknown.  Axis V:  Global Assessment of Functioning 10.   The patient lacks the capacity for informed consent due to her obvious  paranoia, which undermines her reasoning and her judgment.  She is at  risk for lethal self-neglect due to her psychosis.  Therefore, the  undersigned recommends forced antipsychotic medication, if the patient  refuses, so that this psychosis can be reversed.   All life-sustaining care must be forced under the  implied consent rule  while the patient is lacking capacity for informed consent and death due  to self-neglect is a risk.  Commitment does not occur on a medical floor  in the state of [place].  Petition for Commitment is appropriate  once the patient is medically cleared, her decapacitating mental  condition is continuing, and there is an accepting psychiatric facility.   RECOMMENDATION:  1. Would start Zyprexa 5  mg p.o. or IM daily for antipsychosis, anti-      agitation.  2. Would check a QTc is increasing with a goal of 10 mg daily.  3. Would stop Zyprexa if the QTc rises above 500 milliseconds.      Antonietta Breach, M.D.  Electronically Signed     JW/MEDQ  D:  04/25/2007  T:  04/25/2007  Job:  [id number]

## 2010-10-04 NOTE — Discharge Summary (Signed)
NAMEMARKESIA, CRILLY NO.:  [id number]   MEDICAL RECORD NO.:  [id number]          PATIENT TYPE:  INP   LOCATION:  1509                         [facility]ACILITY:  [facility]   PHYSICIAN:  Rosalyn Gess. Norins, MD  DATE OF BIRTH:  06-09-1945   DATE OF ADMISSION:  04/24/2007  DATE OF DISCHARGE:  04/27/2007                               DISCHARGE SUMMARY   DATE OF TRANSFER TO BEHAVIORAL MEDICINE:  04/27/2007.   ADMISSION DIAGNOSES:  1. Drug overdose.  2. Severe depression with psychotic overtones.   DISCHARGE DIAGNOSES:  1. Drug overdose.  2. Severe depression with psychotic overtones.   PROCEDURE:  None.   CONSULTATIONS:  Antonietta Breach, M.D., Psychiatry.   HISTORY OF PRESENT ILLNESS:  The patient was found by her family with  many empty bottles of Tylenol PM, Valium, and Darvocet N-100. She had  ingested an unknown quantity of these things. In the emergency  department, urine screen was positive for benzodiazepines without  opioids. The patient had an acetaminophen level drawn that was initially  114.4 considered to be significantly elevated. The patient was  subsequently admitted with significant life threatening Tylenol  overdose.   PAST MEDICAL HISTORY:  Significant for chronic pain, history of anxiety  and depression, known COPD.   FAMILY HISTORY:  Unknown.   SOCIAL HISTORY:  The patient does live alone. She is a smoker. She does  use alcohol. She is unemployed. She does have a son in town.   ADMISSION MEDICATIONS:  Include prescription for Valium and Darvocet N-  100.   ALLERGIES:  HYDROCODONE.   REVIEW OF SYSTEMS:  The patient was unable to give review of systems at  the time of her admission.   PHYSICAL EXAMINATION:  GENERAL:  An obtunded but arousable Caucasian  female who was able to follow simple commands.  VITAL SIGNS:  Temperature 97, blood pressure 124/60, heart rate 80,  respiratory rate 18. O2 saturation was 100%.  NECK:  Supple.  HEENT:   Pupils were sluggishly reactive but equal.  CARDIOVASCULAR:  Regular rate and rhythm with a 2/6 systolic murmur.  EXTREMITIES:  Pulses were 2+ throughout. She had trace lower extremity  edema.  ABDOMEN:  Positive bowel sounds. Diffuse tenderness.  NEUROLOGIC:  The patient is obtunded. Was able to move all of her  extremities to command.   HOSPITAL COURSE:  1. DRUG OVERDOSE:  The patient was put on a Mucomyst protocol from the      poison control center. On this regimen, the patient had a good      response with her Tylenol level dropping to less than 10 on her      first full hospital day. The patient did have abnormal LFT's with      an ALT of 586 on admission but AST was normal at 28. Actually on      admission, her AST was 91, going to 60, going to 28. Her ALT was      1,102, going to 975, going to 586. Alkaline phosphatase remained      normal. Albumin went from 3.4 to  2.5, indicating poor nutritional      status. With the patient's Tylenol level being less than 10, it was      felt that the Mucomyst protocol could be discontinued. The      patient's abdominal exam is unremarkable without hepatomegaly or      significant tenderness. The patient was thought to be medically      stable in regards to her drug overdose by the a.m. of April 27, 2007.  2. PSYCHIATRIC:  The patient was seen in consultation by Dr.      Jeanie Sewer. Please see his dictated consultation note. It was his      opinion that she:  AXIS I:  Had a psychotic disorder, not otherwise specified with the  reasons of rule out delirium. Detail, the patient lacked the capacity of  informed consent due to paranoia, which underlined her reasoning and  judgment. She was felt to be a lethal risk in regards to self-neglect  due to her psychosis and he recommended for her to stay on her psychotic  medication but the patient refused. He also felt that all life sustained  care had to be forced if necessary because of the  patient's lack of  capacity for informed consent due to self-neglect risk. He did recommend  commitment. She refused voluntary commitment. He felt that she had a  decapacitating mental condition. With the patient being medically stable  as above, she was thought to be stable and ready for transfer to an  inpatient psychiatric facility.  1. FLUIDS, ELECTROLYTES, AND NUTRITION:  The patient did have a drop      in potassium. She was given replacement IV, having refused to take      oral medications. She tolerated this without difficulty. Followup      potassium on April 27, 2007 was normal range of 3.8.   DISCHARGE PHYSICAL EXAMINATION:  VITAL SIGNS:  Discharge examination  performed on 0950 hours revealed that the patient had a temperature of  97.3, blood pressure 121/70. Heart rate was 60. Respiratory rate 18. O2  saturation was 97% on room air.  CHEST:  Clear.  HEART:  Rate regular.  ABDOMEN:  Unremarkable.   DISPOSITION:  The patient is for transfer to a psychiatric facility on  involuntary commitment.   DISCHARGE MEDICATIONS:  1. Zyprexa 5 mg daily.  2. She is wearing a nicotine patch 21 mg, off at 9:00 p.m. and put      back at 6:00 a.m.  3. Ativan 0.5 mg q.3 hours p.r.n. for agitation.   CONDITION ON DISCHARGE:  Medically stable and for psychiatric  commitment.      Rosalyn Gess Norins, MD  Electronically Signed     MEN/MEDQ  D:  04/27/2007  T:  04/27/2007  Job:  [id number]   cc:   Antonietta Breach, M.D.   Ellin Saba., MD  [address] [place]  [place] [address]

## 2010-10-04 NOTE — Discharge Summary (Signed)
Casey Kirk, ALDAZ NO.:  [id number]   MEDICAL RECORD NO.:  [id number]          PATIENT TYPE:  IPS   LOCATION:  0503                          FACILITY:  [facility]   PHYSICIAN:  Geoffery Lyons, M.D.      DATE OF BIRTH:  1945/10/26   DATE OF ADMISSION:  04/27/2007  DATE OF DISCHARGE:  05/27/2007                               DISCHARGE SUMMARY   CHIEF COMPLAINT AND PRESENT ILLNESS:  This was the first admission to  [facility] for this 65 year old divorced white female.  On 12/03, she called her son to tell him good-bye and to make  arrangements to have her dog picked up.  Her son came over and found her  unresponsive and called 911.  She overdosed on Tylenol, Tylenol P.M.,  Darvocet, and benzodiazepines.  She was medically stabilized in the  hospital.  She was seen on 12/04, by Dr. Jeanie Sewer in consultation,  psychiatrist.  She, apparently, presented to the emergency room on  11/20, complaining of depression.  Once stable, she was transferred to  [facility] for further stabilization.  When in the  unit, she was started on Zyprexa 5 mg per day and it was later increased  to 10 mg at bedtime.  Upon initial assessment and transfer to [facility], she was guarded and suspicious.   PAST PSYCHIATRIC HISTORY:  As already stated, she was in the emergency  department on 11/20, complaining of depression and 12/30, overdose.  There is no history of regular psychiatric followup.   SOCIAL HISTORY:  She finished high school.  She has held several jobs.  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  She has 2 sons, age 63 and 62, living in a townhouse by  herself.  She also gets a pension.   SOCIAL HISTORY:  There is no evidence for any active substance abuse.   MEDICAL HISTORY:  COPD.  In the past, has been on inhaler, but she did  not tolerate, so she did not pursue.   MENTAL STATUS  EXAM:  Initially, at the medical unit, reviewed her to be  quite illogical with some other comments reflecting paranoia.  She had  told the physician at the medical unit that she knew that she was from  the police department when the attending physician was clearly dressed  in standard doctor's clothing.  She was refusing medications.  Stated to  the staff that one of the pills had caused her mental dysfunction.  She  had been seen suspicious and paranoid.   ADMITTING DIAGNOSIS:  AXIS I: Psychotic disorder, not otherwise  specified.  AXIS II:    No diagnosis.  AXIS III:   Status post overdose with acetaminophen and increased  transaminases.  AXIS IV:    Moderate.  AXIS V:     GAF upon admission 20, highest in the last year 60.   COURSE IN THE HOSPITAL:  She was admitted to [facility]  She was initially assessed to have depressive  disorder with  psychotic features.  She was maintained on the Zyprexa.  She seemed to  show some improvement in the first few days of her hospitalization.  Her  mood was depressed.  She was complaining of some side effects, but she  was assessed to be expressing confusing, contradictory statements.  To  the family, she was able to evidence paranoid behavior, thinking that  people were talking about her and that the policemen were coming after  her.  She, apparently, quit her job a day or two before admission due to  being paranoid or feeling that she had done something really wrong and  that the police were going to get her.  On 12/11, she had continued with  the above behavior.  She was assessed to be responding to internal  stimuli.  She was put on Celexa due to the underlying depression and she  was maintained on her Zyprexa.  On 12/13, there was a family session  with her 2 adult sons and her best friend.  Concerns were weakness of  the patient to comply with treatment.  She was afraid, concerned about  the label of being mentally ill  and that the medication was going to  make her weird.  She, apparently, took some medication for depression a  few years prior to her admission for a day or two and did not like it.  The family expressed concern around her paranoia and feeling that she  would go to jail.  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  She spoke about concern that she had damaged and hurt her sons with her  actions and depression.  They both confirmed that she had; also, about  being okay and being able to take care of themselves.  By 12/15, even  though she was feeling confused, there was a voice inside her head, her  own voice telling her yes or no to do things or not to do things,  endorsing increased stress, building up to taking the pills, stress at  work, stress in terms of relationship with her mother, but still not  clear in any specific that led her to take the overdose.  She was very  vague, very guarded.  There was an underlying paranoia, also, fear of  police coming to get her for something that she did wrong.  Again,  nonspecific about what she did.  She was served with commitment papers.  She started thinking and ruminating about it and felt that there was  evidence that she was going to be picked up by the police.  She  continued to evidence the paranoid thoughts, ruminating.  We increased  the Zyprexa to 15 mg at bedtime.  December 17, endorsed that she was  able to share the events that led to the overdose and how she started  thinking that she had done something wrong at the assisted-living  facility.  Her son actually called her place of employment and they  would not validate that she did anything wrong.  If anything, they were  willing to employ her again.  Although we tried to challenge her with  what we knew as real, she would not accept any of our explanations.  December 18, she was more labile,  increasingly more anxious, more  disorganized, evidence of a thought disorder.  She was not going to take  the Zyprexa and felt that it was too sedating.  December 18, more  disorganized.  She took only 5 mg out of the 15.  The son was able to  share with the treatment providers that she does have a long history of  mood changes with anger, aggression with episodes of depression, extreme  domination, incapacitating with compulsive behavior with no insight; has  avoided pursuing treatment.  Given the fact that she was very concerned  with the Zyprexa, we went ahead and tried Risperdal.  December 19, she  was not as sedated as with the Zyprexa but still exhibiting rumination,  indecisiveness, underlying paranoia with no insight whatsoever.  We  continued to challenge the distortion, increase validity testing.  She  decreased the Rispderal and then said that she would rather do the  Zyprexa.  She was complaining of dry mouth on the Risperdal.  Would  rather have the sedation.  December 21, continued to be ruminating; was  thinking that she would never be discharged.  Still thinking that she  was going to go to jail.  Ruminating, worried, consumed with worry.  Unable to stop thinking.  Was not willing to take the Risperdal, we  tried Geodon to see if she was able to tolerate it better.  She was  still having active paranoia, rumination, back and forth if she was  going to go to court to challenge the commitment or not.  Still talking  about the police coming to get her.  December 23, she was opening up.  Endorsed that she recognized people in the unit from her past.  Now, she  claimed, everything was making sense.  Things started going down and  that the outcome is that she is going to be killed, eliminated.  Later,  attempt to challenge her distortions, distorted perceptions and per  paranoid ideations.  Went up to 160 of Geodon.  She continued to  complain about the medication.  Eventually,  she claimed that she would  better continue to take the Zyprexa.  She evidenced paralyzing anxiety.  We started some Ativan that showed some benefit but; again, she was  pretty much resistant to take it.  December 26, felt that the Geodon  made her more restless, racing thoughts.  She could tell that the Ativan  was calming.  There were still some delusional ideas having to do with  being a warrant for her arrest, that the insurance would not pay  contrary to the fact that she was able to talk to the people from [employer] and they said that she was covered.  Endorsing that  the insurance would not pay, that they would take her home, that she was  going to he homeless.  People that she recognized, everything making  sense now; there is a conspiracy.  End result, thoughts of hurting  herself as a way of dealing with all the stressors.  We switched her  back to Zyprexa.  Continued to ruminate that there was a warrant out for  her arrest.  She wanted to be taken to the courthouse and let them tell  her that there is no warrant; but, when questioned further, she said  that they might make up things that there is no warrant for.  Questioned  the authenticity of the public defender and would question also the  authenticity of the person at the courthouse giving the information.  Convinced that the insurance was not going to pay,  still worried and  ruminating.  Complaining of sedation with the medication.  Still  connecting people that she recognized and questioned motives.  We  increased Zyprexa to 15 mg at night.  She could not say she would not  hurt herself.  There was some reaction to the medication and there was  some light-headedness.  She was exhausted physically and mentally.  Wanted to get out of the hospital.  Wanted to go to court.  Wanted to  hear from the court that they were no warrants; but, at the same time,  did not trust the court.  Given the fact of the poor  response to  medications, we went ahead and tried to convince her to get a CT scan.  She did not agree to it.  On December 31, she was not sedated, so she  was pleased with that, but she continued to evidence the agitation, the  paranoia on December 31, in the morning, with crying, hopeless about  getting better.  We had added some Haldol that initially seemed to show  some benefit.  January 1, had taken 3 doses of Haldol, seemed to be more  focused on the side effects from the medication but did sleep.  Continued to ruminate about the events of over sedation from any  medication.  January 2, when we thought we were making some progress,  she goes back to alleging that the police were going to pick her up and  that there was a warrant for her arrest.  Would not believe even if she  hears it from the magistrate.  Endorsed that she recognized faces and  understood the bigger picture, the conspiracy.  Not able to accept that  the challenge to her distortion, her paranoia.  January 5, we met with  the staff.  The son was present.  People from the community support were  present.  We had a plan of getting her to go a day program or come to  intensive outpatient program, thinking that we could allow her to be out  in the community where she can start challenging her distortions, but  she became increasingly more agitated, still paranoid, still thinking  that she was going to be picked up by the police.  Given the fact that  she had been in our unit for 4 weeks and we had gotten any further, we  are transferring her to the [facility] to pursue further treatment.  Other modalities like ECT should probably be considered.   DISCHARGE DIAGNOSES:  AXIS I: Rule out schizoaffective disorder.  Rule  out major depression with psychotic features.  AXIS II:    No diagnosis.  AXIS III:   No diagnosis.  AXIS IV:  Moderate.  AXIS V:     Upon discharge, 25.   MEDICATIONS:  Discharged on Celexa 30 per  day, Ativan 0.5 at bedtime,  Zypexa 15 mg at bedtime, Haldol 2 mg 3 times a day.   Being discharged for further stabilization.      Geoffery Lyons, M.D.  Electronically Signed     IL/MEDQ  D:  05/27/2007  T:  05/28/2007  Job:  [id number]

## 2010-10-04 NOTE — H&P (Signed)
Casey Kirk, SOFRANKO NO.:  [id number]   MEDICAL RECORD NO.:  [id number]          PATIENT TYPE:  IPS   LOCATION:  0404                          FACILITY:  [facility]   PHYSICIAN:  Anselm Jungling, MD  DATE OF BIRTH:  03-09-1946   DATE OF ADMISSION:  04/27/2007  DATE OF DISCHARGE:                       PSYCHIATRIC ADMISSION ASSESSMENT   IDENTIFYING INFORMATION:  This is a 65 year old divorced white female.  Apparently on December 3 the patient called her son to tell him goodbye  and to make arrangements to have her dog picked up.  Her son came over,  found her unresponsive, and called 9-1-1.  Apparently she had overdosed  on Tylenol, Tylenol PM, Darvocet and benzodiazepines.  She was medically  stabilized in the hospital.  She was seen in consult December 4 by Dr.  Jeanie Sewer.  The gist of that interview was she had no prior psychiatric  history other than having presented to the emergency department on  November 20 complaining of depression.  She was noncooperative with the  exam.  She was found to be psychotic and Zyprexa 5 mg p.o. or IM daily  for psychosis and agitation was recommended.   [name] was transferred over to the unit yesterday as per  recommendations.  Her Zyprexa was increased to 10 mg at h.s.  We gave  her the Zydis format.  Today, she is still somewhat guarded and  suspicious.  She does report that she had a decreased appetite.  She  really does not remember or at least is not forthcoming with details  regarding why she overdosed.   PAST PSYCHIATRIC HISTORY:  She presented to the emergency department on  November 20 complaining of depression and then she overdosed on December  3.   SOCIAL HISTORY:  She finished high school.  She has been married and  divorced twice.  She has 2 sons ages 42 and 66.  She lives in a  townhouse.  She gets a pension.   FAMILY HISTORY:  She states her mother and maternal aunts all had  depression.   ALCOHOL AND  DRUG HISTORY:  She drinks socially.  She smokes 1 pack per  day.   PRIMARY CARE PHYSICIAN:  Dr. Scotty Court.   She is known to have COPD.  She states she was prescribed an inhaler,  however, she could not tolerate it and does not use it and she does not  recall the name.   DRUG ALLERGIES:  CODEINE.  SHE SAYS IT CAUSES ITCHING.   POSITIVE PHYSICAL FINDINGS:  As she transferred from the hospital, her  physical exam is well documented and on the chart.  Her vital signs on  admission to our unit show that she is 61.25 inches tall, she weighs  116.  Her temperature is 67.8, her blood pressure is 127/78 to 148/76.  Pulse is 66, respirations are 16.   She is status post a hysterectomy at age 69, knee surgery in 56.  She  has had gout in her right pointer finger in the past and she had her  well visit back in November.  MENTAL STATUS EXAM:  Today she is alert and oriented.  She is  appropriately dressed.  She needs to wash her hair.  She appears to be  malnourished.  Her speech is slow but otherwise appropriate.  Her mood  is depressed.  Her affect is blunted.  Her thought processes are clear  at times.  She asked does she need to have a lawyer present to answer  her questions.  Judgment and insight are present.  She wants to know  will her health insurance cover the bill.  Concentration and memory are  fair.  Intelligence is at least average.  She denies being suicidal or  homicidal.  She denies auditory or visual hallucinations.   Axis I Diagnosis:  Psychotic disorder, not otherwise specified, rule out  delusions.  Axis II:  Deferred.  Axis III:  Chronic obstructive pulmonary disease.  Axis IV:  At least moderate.  Axis V:  10.   The plan is to admit for safety and stabilization to reduce and/or  totally eliminate her psychosis.  Toward that end, she has been started  on Zyprexa.  Will have a discharge planning session with her 2 sons to  see if returning to her single townhouse is  appropriate.      Casey Kirk, P.A.-C.      Anselm Jungling, MD  Electronically Signed    MD/MEDQ  D:  04/28/2007  T:  04/29/2007  Job:  [id number]

## 2010-10-13 ENCOUNTER — Ambulatory Visit (INDEPENDENT_AMBULATORY_CARE_PROVIDER_SITE_OTHER): Payer: Medicare PPO | Admitting: Family Medicine

## 2010-10-13 ENCOUNTER — Encounter: Payer: Self-pay | Admitting: Family Medicine

## 2010-10-13 ENCOUNTER — Other Ambulatory Visit (HOSPITAL_COMMUNITY)
Admission: RE | Admit: 2010-10-13 | Discharge: 2010-10-13 | Disposition: A | Discharge status: Home / Self Care | Payer: Medicare PPO | Source: Ambulatory Visit | Attending: Family Medicine | Admitting: Family Medicine

## 2010-10-13 VITALS — BP 98/60 | HR 69 | Temp 98.7°F | Ht 63.25 in | Wt 142.0 lb

## 2010-10-13 DIAGNOSIS — E039 Hypothyroidism, unspecified: Secondary | ICD-10-CM

## 2010-10-13 DIAGNOSIS — M1990 Unspecified osteoarthritis, unspecified site: Secondary | ICD-10-CM

## 2010-10-13 DIAGNOSIS — Z0000 Encounter for general adult medical examination without abnormal findings: Secondary | ICD-10-CM

## 2010-10-13 DIAGNOSIS — E875 Hyperkalemia: Secondary | ICD-10-CM

## 2010-10-13 DIAGNOSIS — J449 Chronic obstructive pulmonary disease, unspecified: Secondary | ICD-10-CM

## 2010-10-13 DIAGNOSIS — R079 Chest pain, unspecified: Secondary | ICD-10-CM

## 2010-10-13 DIAGNOSIS — Z01419 Encounter for gynecological examination (general) (routine) without abnormal findings: Secondary | ICD-10-CM | POA: Insufficient documentation

## 2010-10-13 DIAGNOSIS — M129 Arthropathy, unspecified: Secondary | ICD-10-CM

## 2010-10-13 MED ORDER — LEVOTHYROXINE SODIUM 100 MCG PO TABS: 100.0000 ug | ORAL_TABLET | Freq: Every day | ORAL | Status: DC

## 2010-10-13 NOTE — Patient Instructions (Addendum)
You are doing well, to repeat potassium  And tsh Refilled medication, increased synthroid 100 mcg  Each day Continue other medicines We'll notify you of the Pap results and repeat lab studies

## 2010-10-14 LAB — TSH: TSH: 1.7 u[IU]/mL (ref 0.35–5.50)

## 2010-10-14 LAB — POTASSIUM: Potassium: 4.5 mEq/L (ref 3.5–5.1)

## 2010-10-17 ENCOUNTER — Encounter: Payer: Self-pay | Admitting: Family Medicine

## 2010-10-17 DIAGNOSIS — E039 Hypothyroidism, unspecified: Secondary | ICD-10-CM | POA: Insufficient documentation

## 2010-10-17 HISTORY — DX: Hypothyroidism, unspecified: E03.9

## 2010-10-17 NOTE — Progress Notes (Signed)
  Subjective:    Patient ID: Casey Kirk, female    DOB: [birth year], 65 y.o.   MRN: [id number] this 65 year old white divorced female is in to discuss her medical problems have asked her lab work as well as physical examination for her medical problems and complaints. She is bipolar on lithium at home Synthroid And/or arthritis on diclofenac has problem with anxiety which is controlled with clonazepam she is very much concerned that she may have coronary artery disease and she had a brother to have an early death by myocardial infarction she relates she's been doing her well with her bipolar problem and goes to the health Department to get her lithiumHer other complaint consists of a cough and sometimes chest pain and has no fever chills also is worse at night occasionally is yellow in color but most of the time is clearHPI    Review of Systems see H&P    Objective:   Physical Exam the patient is a well well-nourished white female in no distress cooperative and appears happy HEENT negative carotidsgood. Thyroid nonpalpable Lungs examination reveals decreased breath sounds with minimal expiratory wheeze on expiration no rales no dullness to percussion Heart there was cardiomegaly heart sounds are good without murmurs peripheral pulses good and equal bilaterally regular rhythm, electrocardiogram is normal Breast full no masses palpable mammoplasty axilla clear no lymph nodes Abdomen liver spleen kidneys are nonpalpable no masses felt normal normal bowel sounds Pelvic examination reveals absent uterus vaginal mucosa clear Pap smear done adnexa clear rectal exam negative Extremities negative Skin negative Neurological no positive findings reflexes 2+ bilaterally        Assessment & Plan:  COPD early recommend smoking cessation does not need other medication at this time Bipolar Disorder under control with lithium also her anxiety controlled with clonazepam Arthritis controlled with  diclofenac 75 mg b.i.d. Hypothyroidism controlled with levothyroxin 50 mcg q.d. Cough to take Robitussin OTC

## 2010-10-20 ENCOUNTER — Telehealth: Payer: Self-pay | Admitting: *Deleted

## 2010-10-20 ENCOUNTER — Other Ambulatory Visit: Payer: Self-pay | Admitting: Family Medicine

## 2010-10-20 DIAGNOSIS — J449 Chronic obstructive pulmonary disease, unspecified: Secondary | ICD-10-CM

## 2010-10-20 DIAGNOSIS — R05 Cough: Secondary | ICD-10-CM

## 2010-10-20 MED ORDER — LEVOTHYROXINE SODIUM 100 MCG PO TABS: 100.0000 ug | ORAL_TABLET | Freq: Every day | ORAL | Status: DC

## 2010-10-20 NOTE — Telephone Encounter (Signed)
Pt states Dr. Scotty Court was supposed to call in a larger dose of thyroid for her , and a prescription for Chantix to [facility].

## 2010-10-20 NOTE — Progress Notes (Signed)
Quick Note:  Pt is aware. ______ 

## 2010-10-20 NOTE — Telephone Encounter (Signed)
Spoke with pt and she stated that neither [facility] [address] nor [facility] [address] had her rxs.  [facility] [address] [facility] and Clerance Lav canceled pt's synthroid and Hctz 25. Per Dr. Scotty Court pt does not need to take this medicaiton at this time.  Per pt she would like to have her medications called in to [facility] on [address].  Pt discussed wanting to quit smoking and per Dr. Scotty Court he suggest pt has a chest x-ray first then the next step will be taken.

## 2010-11-04 ENCOUNTER — Telehealth: Payer: Self-pay

## 2010-11-04 NOTE — Telephone Encounter (Signed)
Message copied by Azucena Freed on Fri Nov 04, 2010  2:30 PM ------      Message from: Harvie Heck.      Created: Wed Oct 19, 2010  5:53 PM       Pap was negative

## 2010-11-04 NOTE — Telephone Encounter (Signed)
Called to give pt lab results.  Will send a letter.

## 2010-11-11 ENCOUNTER — Encounter: Payer: Self-pay | Admitting: Family Medicine

## 2010-11-11 ENCOUNTER — Ambulatory Visit (INDEPENDENT_AMBULATORY_CARE_PROVIDER_SITE_OTHER): Payer: Medicare PPO | Admitting: Family Medicine

## 2010-11-11 VITALS — BP 110/70 | HR 60 | Temp 98.7°F | Wt 143.0 lb

## 2010-11-11 DIAGNOSIS — B86 Scabies: Secondary | ICD-10-CM

## 2010-11-11 MED ORDER — MALATHION 0.5 % EX LOTN: TOPICAL_LOTION | Freq: Once | CUTANEOUS | Status: AC

## 2010-11-11 NOTE — Progress Notes (Signed)
  Subjective:    Patient ID: Casey Kirk, female    DOB: 04-Oct-1945, 65 y.o.   MRN: [id number]  HPI Here for one week of diffuse red spots all over her body which itch intensely. She is not sure where these came from, she has not spent the night in any strange beds or hotels. She does have a dog and her son who visits her has several dogs. No fever or other symptoms.    Review of Systems  Constitutional: Negative.   Skin: Positive for rash.       Objective:   Physical Exam  Constitutional: She appears well-developed and well-nourished.  Skin:       Scattered isolated red excoriated spots over the trunk, arms ,and legs.           Assessment & Plan:  This is scabies. Treat with Ovide lotion, top repeat 7 days later.

## 2010-11-24 ENCOUNTER — Telehealth: Payer: Self-pay

## 2010-11-24 NOTE — Telephone Encounter (Signed)
Pt called and stated she had bug bites on her skin that were inflammed; pt is aware that Dr. Scotty Court is out November 25, 2010 through July 20,2012 and pt would like to see Dr. Clent Ridges for a follow up

## 2010-11-25 ENCOUNTER — Encounter: Payer: Self-pay | Admitting: Family Medicine

## 2010-11-25 ENCOUNTER — Ambulatory Visit (INDEPENDENT_AMBULATORY_CARE_PROVIDER_SITE_OTHER): Payer: Medicare PPO | Admitting: Family Medicine

## 2010-11-25 VITALS — BP 112/66 | HR 72 | Temp 98.3°F | Wt 142.0 lb

## 2010-11-25 DIAGNOSIS — R21 Rash and other nonspecific skin eruption: Secondary | ICD-10-CM

## 2010-11-25 MED ORDER — HYDROXYZINE HCL 50 MG PO TABS: 50.0000 mg | ORAL_TABLET | ORAL | Status: AC | PRN

## 2010-11-25 MED ORDER — IVERMECTIN 3 MG PO TABS: 3.0000 mg | ORAL_TABLET | Freq: Once | ORAL | Status: DC

## 2010-11-28 ENCOUNTER — Telehealth: Payer: Self-pay | Admitting: *Deleted

## 2010-11-28 NOTE — Telephone Encounter (Signed)
[facility] Triage Call Report Triage Record Num: [id number] Operator: Albertine Grates Patient Name: Casey Kirk Call Date & Time: 11/25/2010 8:25:48PM Patient Phone: [phone] PCP: Tera Mater. Clent Ridges Patient Gender: Female PCP Fax : [phone] Patient DOB: [birth year] [facility] Name: [facility] Reason for Call: Was seen in office 7-6 and diagnosed with possible parasite on skin. Gave script for Hydroxazine for itching and advised was giving script for Stromectol 3mg  4 tabs to take 7-6 as well. Pharmacist would not fill as usually only fills for 1 tab. Has referred pt. to specialist 7-9 as thinks may have parasite. Wants to know why MD would recommend taking 4 of these tabs. Per [employer] is weight based and she weights 130lbs. Website has doseage table and her dose is 4 tabs. [facility] [phone] notified and advised sig for med was take 1 tab x1. Pharmacist unsure why had quanity of 4 tabs unless was treating rest of family. States is usually weight based medicine. Pt. wants on call notified so can take rest of med if needed. Dr. Dayton Martes notified and advised can take 4 tabs now. Pharmacy notified and will dispense additional 3 tabs. Protocol(s) Used: Office Note Recommended Outcome per Protocol: Information Noted and Sent to Office Reason for Outcome: Caller information to office Care Advice: ~ 11/25/2010 9:13:23PM Page 1 of 1 [email or web address]

## 2010-12-04 ENCOUNTER — Encounter: Payer: Self-pay | Admitting: Family Medicine

## 2010-12-04 NOTE — Progress Notes (Signed)
  Subjective:    Patient ID: Casey Kirk, female    DOB: 08-16-1945, 66 y.o.   MRN: [id number]  HPI Here for continued diffuse rash all over her body which has been present for one month. This has not responded to courses of permethrin and then Ovide lotion. This seems to bee scabies. No one else in the household has such a rash.    Review of Systems  Constitutional: Negative.   Skin: Positive for rash.       Objective:   Physical Exam  Constitutional: She appears well-developed and well-nourished.  Skin:       Diffuse rash all over the body except for the scalp and face consisting of single red papules, all of which are excoriated from scratching           Assessment & Plan:  We will refer her to Dermatology. Try Ivermectin in a single oral dose and try Atarax for itching

## 2010-12-21 ENCOUNTER — Encounter: Payer: Self-pay | Admitting: Family Medicine

## 2011-02-09 LAB — T4, FREE: Free T4: 0.86 — ABNORMAL LOW

## 2011-02-09 LAB — T3, FREE: T3, Free: 2.7 (ref 2.3–4.2)

## 2011-02-09 LAB — TSH: TSH: 3.509

## 2011-02-24 LAB — CBC
HCT: 45.7
Hemoglobin: 15.8 — ABNORMAL HIGH
MCHC: 34.5
MCV: 92.9
Platelets: 240
RBC: 4.92
RDW: 12.9
WBC: 9.9

## 2011-02-24 LAB — COMPREHENSIVE METABOLIC PANEL
ALT: 33
AST: 20
Albumin: 4
Alkaline Phosphatase: 66
BUN: 13
CO2: 25
Calcium: 9.5
Chloride: 107
Creatinine, Ser: 0.74
GFR calc Af Amer: >60
GFR calc non Af Amer: >60
Glucose, Bld: 95
Potassium: 3.6
Sodium: 142
Total Bilirubin: 1.4 — ABNORMAL HIGH
Total Protein: 7

## 2011-02-24 LAB — PROTIME-INR
INR: 1
Prothrombin Time: 12.9

## 2011-02-27 LAB — BLOOD GAS, ARTERIAL
Acid-base deficit: 2.1 — ABNORMAL HIGH
Bicarbonate: 20.9
Drawn by: 229971
O2 Content: 3
O2 Saturation: 98.7
Patient temperature: 97.6
TCO2: 18.3
pCO2 arterial: 31.4 — ABNORMAL LOW
pH, Arterial: 7.436 — ABNORMAL HIGH
pO2, Arterial: 129 — ABNORMAL HIGH

## 2011-02-27 LAB — COMPREHENSIVE METABOLIC PANEL
ALT: 1102 — ABNORMAL HIGH
ALT: 586 — ABNORMAL HIGH
AST: 28
AST: 91 — ABNORMAL HIGH
Albumin: 2.5 — ABNORMAL LOW
Albumin: 3.4 — ABNORMAL LOW
Alkaline Phosphatase: 53
Alkaline Phosphatase: 69
BUN: 10
BUN: 2 — ABNORMAL LOW
CO2: 24
CO2: 26
Calcium: 8.2 — ABNORMAL LOW
Calcium: 8.8
Chloride: 107
Chloride: 114 — ABNORMAL HIGH
Creatinine, Ser: 0.61
Creatinine, Ser: 0.63
GFR calc Af Amer: >60
GFR calc Af Amer: >60
GFR calc non Af Amer: >60
GFR calc non Af Amer: >60
Glucose, Bld: 107 — ABNORMAL HIGH
Glucose, Bld: 82
Potassium: 2.9 — ABNORMAL LOW
Potassium: 3.6
Sodium: 141
Sodium: 143
Total Bilirubin: 0.9
Total Bilirubin: 1.5 — ABNORMAL HIGH
Total Protein: 4.8 — ABNORMAL LOW
Total Protein: 5.9 — ABNORMAL LOW

## 2011-02-27 LAB — BASIC METABOLIC PANEL
BUN: 8
CO2: 24
Calcium: 7.8 — ABNORMAL LOW
Chloride: 111
Creatinine, Ser: 0.63
GFR calc Af Amer: >60
GFR calc non Af Amer: >60
Glucose, Bld: 124 — ABNORMAL HIGH
Potassium: 3.4 — ABNORMAL LOW
Sodium: 142

## 2011-02-27 LAB — URINALYSIS, ROUTINE W REFLEX MICROSCOPIC
Bilirubin Urine: NEGATIVE
Glucose, UA: NEGATIVE
Hgb urine dipstick: NEGATIVE
Ketones, ur: NEGATIVE
Nitrite: NEGATIVE
Protein, ur: NEGATIVE
Specific Gravity, Urine: 1.005
Urobilinogen, UA: 0.2
pH: 7

## 2011-02-27 LAB — HEPATIC FUNCTION PANEL
ALT: 975 — ABNORMAL HIGH
AST: 60 — ABNORMAL HIGH
Albumin: 2.9 — ABNORMAL LOW
Alkaline Phosphatase: 54
Bilirubin, Direct: 0.2
Indirect Bilirubin: 0.6
Total Bilirubin: 0.8
Total Protein: 5.7 — ABNORMAL LOW

## 2011-02-27 LAB — POCT CARDIAC MARKERS
CKMB, poc: 2.3
Myoglobin, poc: 63.8
Operator id: 1192
Troponin i, poc: <0.05

## 2011-02-27 LAB — ACETAMINOPHEN LEVEL
Acetaminophen (Tylenol), Serum: 114.4 — ABNORMAL HIGH
Acetaminophen (Tylenol), Serum: <10 — ABNORMAL LOW

## 2011-02-27 LAB — CBC
HCT: 43.4
Hemoglobin: 14.9
MCHC: 34.3
MCV: 92.4
Platelets: 180
RBC: 4.69
RDW: 13
WBC: 9.4

## 2011-02-27 LAB — DIFFERENTIAL
Basophils Absolute: 0.1
Basophils Relative: 1
Eosinophils Absolute: 0.4
Eosinophils Relative: 5
Lymphocytes Relative: 32
Lymphs Abs: 3
Monocytes Absolute: 0.8
Monocytes Relative: 9
Neutro Abs: 5.2
Neutrophils Relative %: 55

## 2011-02-27 LAB — LACTIC ACID, PLASMA: Lactic Acid, Venous: 4.4 — ABNORMAL HIGH

## 2011-02-27 LAB — MISCELLANEOUS TEST: Miscellaneous Test: 82022

## 2011-02-27 LAB — RAPID URINE DRUG SCREEN, HOSP PERFORMED
Amphetamines: NOT DETECTED
Barbiturates: NOT DETECTED
Benzodiazepines: POSITIVE — AB
Cocaine: NOT DETECTED
Opiates: NOT DETECTED
Tetrahydrocannabinol: NOT DETECTED

## 2011-02-27 LAB — PREGNANCY, URINE: Preg Test, Ur: NEGATIVE

## 2011-02-27 LAB — PROTIME-INR
INR: 1.3
Prothrombin Time: 16 — ABNORMAL HIGH

## 2011-02-27 LAB — POTASSIUM: Potassium: 3.8

## 2011-02-27 LAB — SALICYLATE LEVEL: Salicylate Lvl: <4

## 2011-02-27 LAB — ETHANOL: Alcohol, Ethyl (B): <5

## 2011-02-28 LAB — RAPID URINE DRUG SCREEN, HOSP PERFORMED
Amphetamines: NOT DETECTED
Barbiturates: NOT DETECTED
Benzodiazepines: POSITIVE — AB
Cocaine: NOT DETECTED
Opiates: NOT DETECTED
Tetrahydrocannabinol: NOT DETECTED

## 2011-02-28 LAB — DIFFERENTIAL
Basophils Absolute: 0.1
Basophils Relative: 0
Eosinophils Absolute: 0.1 — ABNORMAL LOW
Eosinophils Relative: 1
Lymphocytes Relative: 25
Lymphs Abs: 2.8
Monocytes Absolute: 0.7
Monocytes Relative: 6
Neutro Abs: 7.7
Neutrophils Relative %: 68

## 2011-02-28 LAB — URINALYSIS, ROUTINE W REFLEX MICROSCOPIC
Bilirubin Urine: NEGATIVE
Glucose, UA: NEGATIVE
Hgb urine dipstick: NEGATIVE
Nitrite: NEGATIVE
Protein, ur: NEGATIVE
Specific Gravity, Urine: 1.008
Urobilinogen, UA: 0.2
pH: 6.5

## 2011-02-28 LAB — BASIC METABOLIC PANEL
BUN: 8
CO2: 29
Calcium: 9.5
Chloride: 108
Creatinine, Ser: 0.71
GFR calc Af Amer: >60
GFR calc non Af Amer: >60
Glucose, Bld: 124 — ABNORMAL HIGH
Potassium: 4.2
Sodium: 145

## 2011-02-28 LAB — CBC
HCT: 42.8
Hemoglobin: 14.9
MCHC: 34.8
MCV: 92.4
Platelets: 203
RBC: 4.63
RDW: 13.9
WBC: 11.3 — ABNORMAL HIGH

## 2011-02-28 LAB — ETHANOL: Alcohol, Ethyl (B): <5

## 2011-03-24 ENCOUNTER — Telehealth: Payer: Self-pay | Admitting: Family Medicine

## 2011-03-24 NOTE — Telephone Encounter (Signed)
Pt would like a rx for shingle vaccine fax to [facility] [phone]

## 2011-03-24 NOTE — Telephone Encounter (Signed)
pls advise

## 2011-03-24 NOTE — Telephone Encounter (Signed)
Have pt come in to re-establish with new primary and can write rx then if no contraindications to vaccine.

## 2011-03-27 NOTE — Telephone Encounter (Signed)
Pt is aware. She has a appt. With Dr. Tawanna Cooler on 05/10/11.

## 2011-05-10 ENCOUNTER — Encounter: Payer: Self-pay | Admitting: Family Medicine

## 2011-05-10 ENCOUNTER — Ambulatory Visit (INDEPENDENT_AMBULATORY_CARE_PROVIDER_SITE_OTHER): Payer: Medicare PPO | Admitting: Family Medicine

## 2011-05-10 DIAGNOSIS — R4702 Dysphasia: Secondary | ICD-10-CM

## 2011-05-10 DIAGNOSIS — I10 Essential (primary) hypertension: Secondary | ICD-10-CM

## 2011-05-10 DIAGNOSIS — F17200 Nicotine dependence, unspecified, uncomplicated: Secondary | ICD-10-CM

## 2011-05-10 DIAGNOSIS — Z720 Tobacco use: Secondary | ICD-10-CM

## 2011-05-10 DIAGNOSIS — R4789 Other speech disturbances: Secondary | ICD-10-CM

## 2011-05-10 DIAGNOSIS — E039 Hypothyroidism, unspecified: Secondary | ICD-10-CM

## 2011-05-10 HISTORY — DX: Dysphasia: R47.02

## 2011-05-10 HISTORY — DX: Tobacco use: Z72.0

## 2011-05-10 MED ORDER — VARENICLINE TARTRATE 1 MG PO TABS: ORAL_TABLET | ORAL | Status: DC

## 2011-05-10 MED ORDER — HYDROCHLOROTHIAZIDE 25 MG PO TABS: ORAL_TABLET | ORAL | Status: DC

## 2011-05-10 NOTE — Patient Instructions (Signed)
Take the hydrochlorothiazide one tablet daily in the morning.  Check your blood pressure at home daily in the morning and return in 4 weeks for follow-up.  Begin the chantix smoking cessation program by taking a half a tablet daily in the morning returning in 4 weeks for follow-up.  Stay on a soft diet, and we will do to set up a consult in GI for evaluation of your swallowing difficulty.  For the chest wall pain.  I would recommend Motrin 400 mg twice a day with food

## 2011-05-10 NOTE — Progress Notes (Signed)
  Subjective:    Patient ID: Casey Kirk, female    DOB: [birth year], 65 y.o.   MRN: [id number]  HPI  Kathryn is a 65 year old female, who comes in today as a new patient having been previously seen by Dr. Scotty Court for evaluation of 3 problems.  She states since, September she's had difficulty swallowing, and feels that food gets stuck in her upper esophagus.  She did have a colonoscopy years ago, but does not recall who did it.  She smokes a pack of cigarettes a day for many years and has never tried to quit.  I explained to her the negative impact of cigarettes on her health and insists that she try a smoking cessation program.  She has a history of hypothyroidism and takes Synthroid 100 mcg daily.  She has a history of mild hypertension and is supposed to be taking hydrochlorothiazide 25 mg daily, but is not taking it.  BP today 140/88.  Last physical examination May 2012  Review of Systems    General GI psychiatric review of systems other than above negative.  All her mental health problems are followed by her psychiatrist Objective:   Physical Exam  Thin female, smells of tobacco.  It is difficult to elicit a history she tends to jump from one topic to another.  HEENT negative.  Neck was supple.  No adenopathy.  Chest was clear.  Cardiac exam normal.  There is palpable tenderness in the left upper anterior chest wall, which he, states it's been sore, continuously since September.      Assessment & Plan:  Hypothyroidism continue Synthroid 100 mcg daily.  Hypertension.  Restart diuretic one daily BP check daily at home.  Follow-up in 4 weeks.  Dysphasia referred to GI for evaluation.  Tobacco abuse began smoking cessation program for chantix follow-up in 4 weeks

## 2011-05-24 ENCOUNTER — Encounter: Payer: Self-pay | Admitting: Internal Medicine

## 2011-05-24 ENCOUNTER — Ambulatory Visit (INDEPENDENT_AMBULATORY_CARE_PROVIDER_SITE_OTHER): Payer: MEDICARE | Admitting: Internal Medicine

## 2011-05-24 VITALS — BP 124/62 | HR 74 | Ht 63.0 in | Wt 141.0 lb

## 2011-05-24 DIAGNOSIS — R141 Gas pain: Secondary | ICD-10-CM

## 2011-05-24 DIAGNOSIS — Z86010 Personal history of colonic polyps: Secondary | ICD-10-CM

## 2011-05-24 DIAGNOSIS — K219 Gastro-esophageal reflux disease without esophagitis: Secondary | ICD-10-CM

## 2011-05-24 DIAGNOSIS — R1310 Dysphagia, unspecified: Secondary | ICD-10-CM

## 2011-05-24 NOTE — Progress Notes (Signed)
HISTORY OF PRESENT ILLNESS:  Casey Kirk is a 66 y.o. female with the below listed medical history who was referred today regarding difficulty with swallowing. The patient reports postprandial fullness with regurgitation since September 2012. Increasing frequency and currently occurs daily. Symptoms are more prominent after consuming solids and generally occur within 30-60 minutes. She has had significant bloating and fullness, particularly after meals. Increase gas. No true abdominal pain. Has had chronic irregularity of her bowels. No change. Weight has been stable. No GI bleeding. Rare vomiting. Some pyrosis. No coughing or choking spells with meals. No nasal regurgitation. I last saw the patient in July of 2011 when she underwent screening colonoscopy. Examination was normal except for diminutive descending colon adenoma which was removed. Followup in 5 years recommended.  REVIEW OF SYSTEMS:  All non-GI ROS negative except for sinus an allergy trouble, shortness of breath, excessive urination.  Past Medical History  Diagnosis Date  . TINEA CORPORIS 10/06/2009  . GOUT 01/09/2007  . BIPOLAR II DISORDER 03/17/2008  . COPD 12/21/2006  . ARTHRITIS, HX OF 01/09/2007  . Carcinoma of vaginal vault   . Hx of colonic polyps   . GERD (gastroesophageal reflux disease)     Past Surgical History  Procedure Date  . Breast surgery     biopsy  . Abdominal hysterectomy     CA cells removed end of vagina  . Vaginal carcinoma surgery     Social History Casey Kirk  reports that she has been smoking Cigarettes.  She has been smoking about .5 packs per day. She has never used smokeless tobacco. She reports that she drinks alcohol. She reports that she does not use illicit drugs.  family history includes Alcohol abuse in her other; Arthritis in her other; Cancer in her other; Colon polyps in her brother and mother; Diabetes in her other; Heart disease in her other; Hyperlipidemia in her other;  Hypertension in her other; and Sudden death in her other.  Allergies  Allergen Reactions  . Hydrocodone-Acetaminophen   . Latex        PHYSICAL EXAMINATION: Vital signs: BP 124/62  Pulse 74  Ht 5\' 3"  (1.6 m)  Wt 141 lb (63.957 kg)  BMI 24.98 kg/m2  Constitutional: generally well-appearing, no acute distress Psychiatric: alert and oriented x3, cooperative Eyes: extraocular movements intact, anicteric, conjunctiva pink Mouth: oral pharynx moist, no lesions Neck: supple, no lymphadenopathy Cardiovascular: heart regular rate and rhythm, no murmur Lungs: clear to auscultation bilaterally Abdomen: soft, nontender, nondistended, no obvious ascites, no peritoneal signs, normal bowel sounds, no organomegaly. No succussion splash Extremities: no lower extremity edema bilaterally Skin: no lesions on visible extremities Neuro: No focal deficits.   ASSESSMENT:  #1. Regurgitation with some pyrosis. Rule out GERD, rule out gastroparesis or partial outlet obstruction, rule out intrinsic esophageal lesion #2. Bloating with increased gas #3. History of adenomatous colon polyp July 2011   PLAN:  #1. Diagnostic upper endoscopy.The nature of the procedure, as well as the risks, benefits, and alternatives were carefully and thoroughly reviewed with the patient. Ample time for discussion and questions allowed. The patient understood, was satisfied, and agreed to proceed.  #2. If endoscopy negative initiate PPI #3. If endoscopy negative schedule solid-phase gastric emptying scan #4. Surveillance colonoscopy around July 2016

## 2011-05-24 NOTE — Patient Instructions (Addendum)
You have been given a separate informational sheet regarding your tobacco use, the importance of quitting and local resources to help you quit.  You have been scheduled for an endoscopy. Please follow written instructions given to you at your visit today.

## 2011-05-25 ENCOUNTER — Encounter: Payer: Self-pay | Admitting: Internal Medicine

## 2011-06-06 ENCOUNTER — Ambulatory Visit (AMBULATORY_SURGERY_CENTER): Payer: MEDICARE | Admitting: Internal Medicine

## 2011-06-06 ENCOUNTER — Encounter: Payer: Self-pay | Admitting: Internal Medicine

## 2011-06-06 VITALS — BP 126/73 | HR 53 | Temp 97.0°F | Resp 22 | Ht 63.0 in | Wt 141.0 lb

## 2011-06-06 DIAGNOSIS — K219 Gastro-esophageal reflux disease without esophagitis: Secondary | ICD-10-CM

## 2011-06-06 DIAGNOSIS — R1310 Dysphagia, unspecified: Secondary | ICD-10-CM

## 2011-06-06 DIAGNOSIS — R141 Gas pain: Secondary | ICD-10-CM

## 2011-06-06 MED ORDER — SODIUM CHLORIDE 0.9 % IV SOLN: 500.0000 mL | INTRAVENOUS | Status: DC

## 2011-06-06 MED ORDER — OMEPRAZOLE 40 MG PO CPDR: 40.0000 mg | DELAYED_RELEASE_CAPSULE | Freq: Every day | ORAL | Status: DC

## 2011-06-06 NOTE — Op Note (Signed)
[facility] [address] [place], [place]  [address]  ENDOSCOPY PROCEDURE REPORT  PATIENT:  Casey, Kirk  MR#:  [id number] BIRTHDATE:  29-Dec-1945, 65 yrs. old  GENDER:  female  ENDOSCOPIST:  Wilhemina Bonito. Eda Keys, MD Referred by:  Office  PROCEDURE DATE:  06/06/2011 PROCEDURE:  EGD, diagnostic 62130 ASA CLASS:  Class II INDICATIONS:  bloating, reflux symptoms,regurgitation  MEDICATIONS:   MAC sedation, administered by CRNA, propofol (Diprivan) 170 mg IV TOPICAL ANESTHETIC:  none  DESCRIPTION OF PROCEDURE:   After the risks benefits and alternatives of the procedure were thoroughly explained, informed consent was obtained.  The LB GIF-H180 [id number] endoscope was introduced through the mouth and advanced to the third portion of the duodenum, without limitations.  The instrument was slowly withdrawn as the mucosa was fully examined. <<PROCEDUREIMAGES>>  The upper, middle, and distal third of the esophagus were carefully inspected and no abnormalities were noted. The z-line was well seen at the GEJ. The endoscope was pushed into the fundus which was normal including a retroflexed view. The antrum,gastric body, first, second and third part of the duodenum were unremarkable.    Retroflexed views revealed no abnormalities. The scope was then withdrawn from the patient and the procedure completed.  COMPLICATIONS:  None  ENDOSCOPIC IMPRESSION: 1) Normal EGD 2) ? GERD or Gastroparesis to account for symptoms  RECOMMENDATIONS: 1) PRESCRIBE OMEPRAZOLE 40MG  DAILY; #30; 6 REFILLS  2) My office will arrange for you to have a "SOLID PHASE Gastric Emptying Scan" performed to "R/O GASTROPARESIS". This is a radiology test that gives an idea of how well your stomach functions.  3) OP follow-up with Dr Marina Goodell in 6 weeks. Please call to make your appointment  ______________________________ Wilhemina Bonito. Eda Keys, MD  CC:  The Patient;  Roderick Pee, MD  n. Rosalie DoctorWilhemina Bonito.  Eda Keys at 06/06/2011 12:41 PM  Wille Glaser, [phone]

## 2011-06-06 NOTE — Progress Notes (Signed)
Patient did not have preoperative order for IV antibiotic SSI prophylaxis. (G8918)  Patient did not experience any of the following events: a burn prior to discharge; a fall within the facility; wrong site/side/patient/procedure/implant event; or a hospital transfer or hospital admission upon discharge from the facility. (G8907)  

## 2011-06-07 ENCOUNTER — Other Ambulatory Visit: Payer: Self-pay | Admitting: Internal Medicine

## 2011-06-07 ENCOUNTER — Ambulatory Visit: Payer: Medicare PPO | Admitting: Family Medicine

## 2011-06-07 ENCOUNTER — Telehealth: Payer: Self-pay

## 2011-06-07 ENCOUNTER — Telehealth: Payer: Self-pay | Admitting: *Deleted

## 2011-06-07 NOTE — Telephone Encounter (Signed)

## 2011-06-07 NOTE — Telephone Encounter (Signed)
Pt scheduled for GES @[facility]  for 06/16/11 arrival time 9:45am for 10am appt. Pt to be NPO after midnight and to hold her Prilosec 24hours prior to test. Pt aware of appt date and time. States that day will not work for her. Pt given the phone number [phone] to call and reschedule her appt to a date that will work for her. Pt verbalized understanding.

## 2011-06-16 ENCOUNTER — Other Ambulatory Visit (HOSPITAL_COMMUNITY): Payer: MEDICARE

## 2011-06-22 ENCOUNTER — Ambulatory Visit: Payer: Self-pay | Admitting: Family Medicine

## 2011-07-14 ENCOUNTER — Ambulatory Visit (INDEPENDENT_AMBULATORY_CARE_PROVIDER_SITE_OTHER): Payer: MEDICARE | Admitting: Family Medicine

## 2011-07-14 ENCOUNTER — Telehealth: Payer: Self-pay | Admitting: Internal Medicine

## 2011-07-14 VITALS — BP 101/67 | HR 83 | Temp 98.2°F | Resp 16 | Ht 63.0 in | Wt 142.0 lb

## 2011-07-14 DIAGNOSIS — H6690 Otitis media, unspecified, unspecified ear: Secondary | ICD-10-CM

## 2011-07-14 DIAGNOSIS — M546 Pain in thoracic spine: Secondary | ICD-10-CM

## 2011-07-14 DIAGNOSIS — B9789 Other viral agents as the cause of diseases classified elsewhere: Secondary | ICD-10-CM

## 2011-07-14 DIAGNOSIS — B349 Viral infection, unspecified: Secondary | ICD-10-CM

## 2011-07-14 DIAGNOSIS — H66009 Acute suppurative otitis media without spontaneous rupture of ear drum, unspecified ear: Secondary | ICD-10-CM

## 2011-07-14 MED ORDER — AMOXICILLIN 875 MG PO TABS: 875.0000 mg | ORAL_TABLET | Freq: Two times a day (BID) | ORAL | Status: AC

## 2011-07-14 MED ORDER — FLUTICASONE PROPIONATE 50 MCG/ACT NA SUSP: 2.0000 | Freq: Every day | NASAL | Status: DC

## 2011-07-14 NOTE — Telephone Encounter (Signed)
Pt called to cancel her appt with Dr. Marina Goodell because she has not had the GES yet. Pt states she will call back and reschedule her appt with Dr. Marina Goodell once she has the GES appt.

## 2011-07-14 NOTE — Progress Notes (Signed)
  Subjective:    Patient ID: Casey Kirk, female    DOB: 04/23/1946, 66 y.o.   MRN: [id number]  Sinusitis This is a new problem. The current episode started in the past 7 days. The problem has been gradually worsening since onset. There has been no fever. Her pain is at a severity of 0/10. Associated symptoms include chills, congestion, coughing (occasional), ear pain, headaches and sneezing (clear drainage). Past treatments include oral decongestants and spray decongestants.  Back Pain Associated symptoms include headaches.   (L) sided thoracic pain present yesterday after sneezing.  Pain much improved today.   Review of Systems  Constitutional: Positive for chills.  HENT: Positive for ear pain, congestion and sneezing (clear drainage).   Respiratory: Positive for cough (occasional).   Musculoskeletal: Positive for back pain.  Neurological: Positive for headaches.       Objective:   Physical Exam  Constitutional: She appears well-developed and well-nourished.  HENT:  Head: Normocephalic and atraumatic.  Right Ear: Tympanic membrane is erythematous.  Left Ear: Tympanic membrane normal.  Nose: Mucosal edema and rhinorrhea (clear) present.  Eyes: Conjunctivae are normal.  Neck: Neck supple.  Cardiovascular: Normal rate, regular rhythm and normal heart sounds.   Pulmonary/Chest: Effort normal and breath sounds normal.  Neurological: She is alert.  Skin: Skin is warm.  Psychiatric: She has a normal mood and affect.          Assessment & Plan:   1. Viral illness   2. OM (otitis media), (R)   3. Back pain, thoracic     Tylenol for fever, and myalgas See medications prescribed on AVS Anticipatory guidance

## 2011-07-18 NOTE — Telephone Encounter (Signed)
Pt scheduled to see Dr. Marina Goodell after her GES. Pt scheduled to see Dr. Marina Goodell 08/14/11@1 :30pm. Letter mailed to pt with appt date and time.

## 2011-07-19 ENCOUNTER — Ambulatory Visit: Payer: MEDICARE | Admitting: Internal Medicine

## 2011-07-25 ENCOUNTER — Other Ambulatory Visit (HOSPITAL_COMMUNITY): Payer: MEDICARE

## 2011-08-07 ENCOUNTER — Encounter (HOSPITAL_COMMUNITY)
Admission: RE | Admit: 2011-08-07 | Discharge: 2011-08-07 | Disposition: A | Discharge status: Home / Self Care | Payer: Medicare Other | Source: Ambulatory Visit | Attending: Internal Medicine | Admitting: Internal Medicine

## 2011-08-07 DIAGNOSIS — R142 Eructation: Secondary | ICD-10-CM | POA: Insufficient documentation

## 2011-08-07 DIAGNOSIS — R141 Gas pain: Secondary | ICD-10-CM | POA: Insufficient documentation

## 2011-08-07 DIAGNOSIS — R109 Unspecified abdominal pain: Secondary | ICD-10-CM | POA: Insufficient documentation

## 2011-08-07 DIAGNOSIS — K219 Gastro-esophageal reflux disease without esophagitis: Secondary | ICD-10-CM | POA: Insufficient documentation

## 2011-08-07 MED ORDER — TECHNETIUM TC 99M SULFUR COLLOID
2.2000 | Freq: Once | INTRAVENOUS | Status: AC | PRN
  Administered 2011-08-07: 2.2 via INTRAVENOUS

## 2011-08-14 ENCOUNTER — Ambulatory Visit (INDEPENDENT_AMBULATORY_CARE_PROVIDER_SITE_OTHER): Payer: Medicare Other | Admitting: Internal Medicine

## 2011-08-14 ENCOUNTER — Encounter: Payer: Self-pay | Admitting: Internal Medicine

## 2011-08-14 VITALS — BP 124/62 | HR 68 | Ht 63.0 in | Wt 143.0 lb

## 2011-08-14 DIAGNOSIS — Z86010 Personal history of colonic polyps: Secondary | ICD-10-CM

## 2011-08-14 DIAGNOSIS — K219 Gastro-esophageal reflux disease without esophagitis: Secondary | ICD-10-CM

## 2011-08-14 NOTE — Patient Instructions (Signed)
Follow-up with Dr. Perry as needed.  

## 2011-08-14 NOTE — Progress Notes (Signed)
HISTORY OF PRESENT ILLNESS:  Casey Kirk is a 66 y.o. female with the below listed medical history who presents today for followup. She was seen 05/24/2011 regarding regurgitation with pyrosis as well as bloating and increased intestinal gas. She dictation. She subsequently underwent upper endoscopy. This was unremarkable. Solid-phase gastric emptying scan was normal. She was placed on proton pump inhibitor therapy and instructed to followup at this time. She has been compliant with omeprazole 40 mg daily. She is pleased to report that her symptoms have resolved. No new problems. No medication side effects appreciated. We reviewed her workup in detail.  REVIEW OF SYSTEMS:  All non-GI ROS negative.  Past Medical History  Diagnosis Date  . TINEA CORPORIS 10/06/2009  . GOUT 01/09/2007  . BIPOLAR II DISORDER 03/17/2008  . COPD 12/21/2006  . ARTHRITIS, HX OF 01/09/2007  . Carcinoma of vaginal vault   . Hx of colonic polyps   . GERD (gastroesophageal reflux disease)   . Thyroid disease     Past Surgical History  Procedure Date  . Breast surgery     biopsy  . Abdominal hysterectomy     CA cells removed end of vagina  . Vaginal carcinoma surgery     Social History LYGIA OLAES  reports that she has been smoking Cigarettes.  She has been smoking about .5 packs per day. She has never used smokeless tobacco. She reports that she drinks alcohol. She reports that she does not use illicit drugs.  family history includes Alcohol abuse in her other; Arthritis in her other; Cancer in her other; Colon polyps in her brother and mother; Diabetes in her other; Heart disease in her other; Hyperlipidemia in her other; Hypertension in her other; and Sudden death in her other.  There is no history of Colon cancer.  Allergies  Allergen Reactions  . Hydrocodone-Acetaminophen   . Latex        PHYSICAL EXAMINATION: Vital signs: BP 124/62  Pulse 68  Ht 5\' 3"  (1.6 m)  Wt 143 lb (64.864 kg)  BMI  25.33 kg/m2 General: Well-developed, well-nourished, no acute distress HEENT: Sclerae are anicteric, conjunctiva pink. Oral mucosa intact Lungs: Clear Heart: Regular Abdomen: soft, nontender, nondistended, no obvious ascites, no peritoneal signs, normal bowel sounds. No organomegaly. Extremities: No edema Psychiatric: alert and oriented x3. Cooperative     ASSESSMENT:  #1. GERD. Likely explanation for recent GI symptoms. Negative EGD and gastric emptying scan. Resolution of symptoms on PPI. #2. History of adenomatous colon polyps. Last colonoscopy July 2011  PLAN:  #1. Continue omeprazole. After 6 or 8 weeks, may discontinue the medication to see if there is symptom relapse. If so, she may resume medication to control symptoms #2. Reflux precautions #3. Surveillance colonoscopy around July 2016. Interval GI followup as needed

## 2011-09-28 ENCOUNTER — Ambulatory Visit (INDEPENDENT_AMBULATORY_CARE_PROVIDER_SITE_OTHER): Payer: Medicare Other | Admitting: Family Medicine

## 2011-09-28 DIAGNOSIS — H6981 Other specified disorders of Eustachian tube, right ear: Secondary | ICD-10-CM

## 2011-09-28 DIAGNOSIS — J309 Allergic rhinitis, unspecified: Secondary | ICD-10-CM

## 2011-09-28 MED ORDER — PREDNISONE 20 MG PO TABS: 20.0000 mg | ORAL_TABLET | Freq: Every day | ORAL | Status: AC

## 2011-09-28 MED ORDER — FLUTICASONE PROPIONATE 50 MCG/ACT NA SUSP: 2.0000 | Freq: Every day | NASAL | Status: DC

## 2011-09-28 NOTE — Progress Notes (Signed)
  Subjective:    Patient ID: Casey Kirk, female    DOB: 10/29/1945, 66 y.o.   MRN: [id number]  Otalgia  There is pain in the right ear. This is a new problem. The current episode started in the past 7 days. Pain scale: after placing cottom swab in (R) ear significant pain. Associated symptoms include coughing and drainage. Associated symptoms comments: sneezing. Her past medical history is significant for hearing loss.  Cough Associated symptoms include ear pain.   Tried to clean out (R) ear with Q Tip and heard pop; Then tried to irrigated ear with warm water. After irrigation transiently developed vertigious symptoms.  Feels as if hearing in (R) ear diminished  Review of Systems  HENT: Positive for ear pain.   Respiratory: Positive for cough.        Objective:   Physical Exam  Constitutional: She appears well-developed.  HENT:       (R) TM retracted Swollen nares  Neck: Neck supple. No thyromegaly present.  Cardiovascular: Normal rate, regular rhythm and normal heart sounds.   Pulmonary/Chest: Effort normal and breath sounds normal.  Abdominal: Soft. Bowel sounds are normal.         Assessment & Plan:  Allergic Rhinitis ETD (R) > (L) ear  Flonase NS UAD Anticipatory guidance Claritin OTC Prednisone 20 mg daily for 7 days

## 2012-04-29 ENCOUNTER — Other Ambulatory Visit: Payer: Self-pay | Admitting: Obstetrics and Gynecology

## 2012-04-29 ENCOUNTER — Other Ambulatory Visit (HOSPITAL_COMMUNITY)
Admission: RE | Admit: 2012-04-29 | Discharge: 2012-04-29 | Disposition: A | Discharge status: Home / Self Care | Payer: Medicare Other | Source: Ambulatory Visit | Attending: Obstetrics and Gynecology | Admitting: Obstetrics and Gynecology

## 2012-04-29 ENCOUNTER — Other Ambulatory Visit: Payer: Self-pay

## 2012-04-29 DIAGNOSIS — Z124 Encounter for screening for malignant neoplasm of cervix: Secondary | ICD-10-CM | POA: Insufficient documentation

## 2013-07-21 ENCOUNTER — Ambulatory Visit: Payer: Medicare Other | Admitting: Neurology

## 2013-07-29 ENCOUNTER — Encounter: Payer: Self-pay | Admitting: *Deleted

## 2013-07-30 ENCOUNTER — Encounter: Payer: Self-pay | Admitting: Neurology

## 2013-07-30 ENCOUNTER — Ambulatory Visit (INDEPENDENT_AMBULATORY_CARE_PROVIDER_SITE_OTHER): Payer: Medicare HMO | Admitting: Neurology

## 2013-07-30 VITALS — BP 108/74 | HR 64 | Temp 99.0°F | Ht 64.96 in | Wt 141.3 lb

## 2013-07-30 DIAGNOSIS — R471 Dysarthria and anarthria: Secondary | ICD-10-CM

## 2013-07-30 NOTE — Progress Notes (Signed)
Notes faxed.

## 2013-07-30 NOTE — Patient Instructions (Signed)
1.  Check blood work today 2.  Recommend referral to ENT to evaluate vocal cords 3.  If you do not hear from your primary care doctors office within 1-week for ENT appointment, please call us 4.  Strongly encouraged patient to stop smoking 5.  Return to clinic in 9-months

## 2013-07-30 NOTE — Progress Notes (Signed)
[facility] Clinic Note - Initial Visit   Date: 07/30/2013    Casey Kirk MRN: [id number] DOB: Jan 11, 1946   Dear Dr Harrington Challenger:  Thank you for your kind referral of Casey Kirk for consultation of spastic dysphonia. Although her history is well known to you, please allow [place] to reiterate it for the purpose of our medical record. The patient was accompanied to the clinic by self.    History of Present Illness: Casey Kirk is a 68 y.o. right-handed Caucasian female with history of COPD, GERD, bipolar disorder with depression, hypothyroidism, sinus congestion and tobacco use presenting for evaluation of changes in speech.   Around 2013, she started noticing difficulty getting words out and "cracking and quivvering" of her voice.  She feels that she knows what she wants to say, but the words do not come out fast enough.  Denies any associated stutter or slurring of speech.  Overall the past two years, speech has become more effortful.Marland Kitchen  Speech is worse with prolonged talking, but there is no improvement with rest such as when she gets up in the morning.  She has no problems thinking of what she wants to say, comprehending instructions, repeating, and denies misnaming things.  She has not seen ENT in the past.  She endorses occasional lightheadedness. No falls.  She feels embarrassed in social settings or conversing with people, because she has to repeat herself frequently.  Denies any problems with vision, difficulty swallowing, or weakness.  Memory and mood is good.  She lives alone and takes care of all her own ADLs and IADLs without difficulty.    She reports having history of acid reflux, which is well-controlled.  Of note, she currently smokes 0.5ppd x 40 years, but was smoking 1ppd for several years.  Out-side paper records, electronic medical record, and images have been reviewed where available and summarized as:  Labs 02/10/2013:  TSH 3.15, vitamin D 31.2,  Na 141, K 4.8, Cr 0.79, AST 15, ALT 14,    Past Medical History  Diagnosis Date  . TINEA CORPORIS 10/06/2009  . GOUT 01/09/2007  . BIPOLAR II DISORDER 03/17/2008  . COPD 12/21/2006  . ARTHRITIS, HX OF 01/09/2007  . Carcinoma of vaginal vault   . Hx of colonic polyps   . GERD (gastroesophageal reflux disease)   . Thyroid disease     Past Surgical History  Procedure Laterality Date  . Breast surgery      biopsy  . Abdominal hysterectomy      CA cells removed end of vagina  . Vaginal carcinoma surgery       Medications:  Current Outpatient Prescriptions on File Prior to Visit  Medication Sig Dispense Refill  . aspirin 81 MG tablet Take 81 mg by mouth daily.        . calcium citrate-vitamin D (CITRACAL+D) 315-200 MG-UNIT per tablet Take 1 tablet by mouth daily.        . fish oil-omega-3 fatty acids 1000 MG capsule Take 2 g by mouth daily.        . hydrochlorothiazide (HYDRODIURIL) 25 MG tablet 2 stat then 1 qd  100 tablet  3  . lamoTRIgine (LAMICTAL) 25 MG tablet Take 25 mg by mouth daily.        . Multiple Vitamin (MULTIVITAMIN) tablet Take 1 tablet by mouth daily.        . vitamin C (ASCORBIC ACID) 500 MG tablet Take 500 mg by mouth daily.        Marland Kitchen  fluticasone (FLONASE) 50 MCG/ACT nasal spray Place 2 sprays into the nose daily.  1 g  6  . omeprazole (PRILOSEC) 40 MG capsule Take 1 capsule (40 mg total) by mouth daily.  30 capsule  6   No current facility-administered medications on file prior to visit.    Allergies:  Allergies  Allergen Reactions  . Hydrocodone-Acetaminophen   . Latex     Family History: Family History  Problem Relation Age of Onset  . Alcohol abuse Other   . Arthritis Other   . Diabetes Other   . Hyperlipidemia Other   . Hypertension Other   . Cancer Other     ovarian, uterine  . Heart disease Other   . Sudden death Other   . Colon polyps Mother   . Colon polyps Brother   . Colon cancer Neg Hx     Social History: History   Social History   . Marital Status: Divorced    Spouse Name: N/A    Number of Children: 2  . Years of Education: N/A   Occupational History  . retired    Social History Main Topics  . Smoking status: Current Every Day Smoker -- 0.50 packs/day    Types: Cigarettes  . Smokeless tobacco: Never Used     Comment: Counseling sheet to quit smoking given in exam room   . Alcohol Use: Yes     Comment: occ but rare   . Drug Use: No  . Sexual Activity: Yes   Other Topics Concern  . Not on file   Social History Narrative  . No narrative on file    Review of Systems:  CONSTITUTIONAL: No fevers, chills, night sweats, or weight loss.   EYES: No visual changes or eye pain ENT: No hearing changes.  No history of nose bleeds.   RESPIRATORY: No cough, wheezing and shortness of breath.   CARDIOVASCULAR: Negative for chest pain, and palpitations.   GI: Negative for abdominal discomfort, blood in stools or black stools.  No recent change in bowel habits.   GU:  No history of incontinence.   MUSCLOSKELETAL: No history of joint pain or swelling.  No myalgias.   SKIN: Negative for lesions, rash, and itching.   HEMATOLOGY/ONCOLOGY: Negative for prolonged bleeding, bruising easily, and swollen nodes.  No history of cancer.   ENDOCRINE: Negative for cold or heat intolerance, polydipsia or goiter.   PSYCH:  +depression or anxiety symptoms.   NEURO: As Above.   Vital Signs:  BP 108/74  Pulse 64  Temp(Src) 99 F (37.2 C)  Ht 5' 4.96" (1.65 m)  Wt 141 lb 5 oz (64.099 kg)  BMI 23.54 kg/m2  SpO2 97% Pain Scale: 0 on a scale of 0-10   General Medical Exam:   General:  Well appearing, comfortable.   Eyes/ENT: see cranial nerve examination.   Neck: No masses appreciated.  Full range of motion without tenderness.  No carotid bruits. Respiratory:  Clear to auscultation, good air entry bilaterally.   Cardiac:  Regular rate and rhythm, no murmur.   Back:  No pain to palpation of spinous processes.   Extremities:   No deformities, edema, or skin discoloration. Good capillary refill.   Skin:  Skin color, texture, turgor normal. No rashes or lesions.  Neurological Exam: MENTAL STATUS including orientation to time, place, person, recent and remote memory, attention span and concentration, language, and fund of knowledge is normal.  Speech is spastic (~75% intelligible), difficulty with repeating ka-ka and ga-ga,  easier for her to say ta-ta and pa-pa.  Reading, writing, repetition, and naming is intact.  She has beautiful cursive penmanship.  MOCA test:  23/30 (-2 VS, -1 language, -2 abstraction, -2 recall)   CRANIAL NERVES: II:  No visual field defects.  Unremarkable fundi.   III-IV-VI: Pupils equal round and reactive to light.  Normal conjugate, extra-ocular eye movements in all directions of gaze.  No nystagmus.  No ptosis at rest or with sustained upward gaze.  She is able to count to 30 on deep inhalation. V:  Normal facial sensation.  Jaw jerk is absent.   VII:  Normal facial symmetry and movements. Frontalis, orbicularis oris, orbicularis oculi, and buccinator muscles are 5/5.  No pathologic facial reflexes.  VIII:  Normal hearing and vestibular function.   IX-X:  Normal palatal movement.   XI:  Normal shoulder shrug and head rotation.   XII:  Normal tongue strength and range of motion, no deviation or fasciculation.  Subtle slowness with tongue movements.  MOTOR:  No atrophy, fasciculations or abnormal movements.  No pronator drift.  Tone is normal.    Right Upper Extremity:    Left Upper Extremity:    Deltoid  5/5   Deltoid  5/5   Biceps  5/5   Biceps  5/5   Triceps  5/5   Triceps  5/5   Wrist extensors  5/5   Wrist extensors  5/5   Wrist flexors  5/5   Wrist flexors  5/5   Finger extensors  5/5   Finger extensors  5/5   Finger flexors  5/5   Finger flexors  5/5   Dorsal interossei  5/5   Dorsal interossei  5/5   Abductor pollicis  5/5   Abductor pollicis  5/5   Tone (Ashworth scale)  0  Tone  (Ashworth scale)  0   Right Lower Extremity:    Left Lower Extremity:    Hip flexors  5/5   Hip flexors  5/5   Hip extensors  5/5   Hip extensors  5/5   Knee flexors  5/5   Knee flexors  5/5   Knee extensors  5/5   Knee extensors  5/5   Dorsiflexors  5/5   Dorsiflexors  5/5   Plantarflexors  5/5   Plantarflexors  5/5   Toe extensors  5/5   Toe extensors  5/5   Toe flexors  5/5   Toe flexors  5/5   Tone (Ashworth scale)  0  Tone (Ashworth scale)  0   MSRs:  Right                                                                 Left brachioradialis 3+  brachioradialis 3+  biceps 3+  biceps 3+  triceps 2+  triceps 2+  patellar 2+  patellar 2+  ankle jerk 1+  ankle jerk 1+  Hoffman no  Hoffman no  plantar response down  plantar response down   SENSORY:  Normal and symmetric perception of light touch, pinprick, vibration, and proprioception.  Romberg's sign absent.   COORDINATION/GAIT: Normal finger-to- nose-finger and heel-to-shin.  Intact rapid alternating movements bilaterally.  Able to rise from a chair without using arms.  Gait narrow based and stable.  Tandem and stressed gait intact.    IMPRESSION: Ms. Beier is a pleasant 68 year-old female presenting for evaluation of changes in voice. Her exam is notable for spastic, effortful speech.  Speech does not appear to be hesitant, but there is a halting quality to it.  She has more difficulty with guttural sounds. The only aspects to language that is affected is vocalization, there is no deficits in comprehension, repetition, writing, or reading.  There is also evidence of mild cognitive impairment based on MOCA testing which may suggest underlying neurodegenerative condition.  However, since these are diagnosis of exclusion, I would like for her to be evaluated by ENT to be sure there is no structural problem with the vocal cord.  I will also screen for myasthenia gravis as this is a treatable condition, but overall clinical suspicion is  low.  If ENT evaluation is unrevealing, I will plan for MRI brain and/or formal neuropsychiatric testing since symptoms may be manifestation of nonfluent-agrammatic variant primary progressive aphasia, but it is atypical that her comprehension and grammar is intact.   PLAN/RECOMMENDATIONS:  1.  Check MG panel 2.  Will request Dr. Alan Ripper office to initiate referral to ENT for evaluation of vocal cords 3.  Consider MRI brain going forward if ENT evaluation is negative 4.  Consider speech therapy pending ENT work-up 5.  Strongly encouraged tobacco cessation 6.  Return 38-months   The duration of this appointment visit was 50 minutes of face-to-face time with the patient.  Greater than 50% of this time was spent in counseling, explanation of diagnosis, planning of further management, and coordination of care.   Thank you for allowing me to participate in patient's care.  If I can answer any additional questions, I would be pleased to do so.    Sincerely,    Lether Tesch K. Posey Pronto, DO

## 2013-08-06 LAB — MYASTHENIA GRAVIS PANEL 2
Acetylcholine Rec Binding: <0.3 nmol/L
Acetylcholine Rec Mod Ab: 15 %
Aceytlcholine Rec Bloc Ab: <15 % of inhibition (ref <15)

## 2013-10-02 ENCOUNTER — Telehealth: Payer: Self-pay | Admitting: Neurology

## 2013-10-02 NOTE — Telephone Encounter (Signed)
Pt called to cancel 10/03/13 follow up w/ dr. Posey Pronto. She says she will call later to r/s / Casey S.

## 2013-10-03 ENCOUNTER — Ambulatory Visit: Payer: Medicare Other | Admitting: Neurology

## 2014-01-15 ENCOUNTER — Telehealth: Payer: Self-pay | Admitting: Internal Medicine

## 2014-01-21 ENCOUNTER — Other Ambulatory Visit: Payer: Self-pay | Admitting: Family Medicine

## 2014-01-21 DIAGNOSIS — R109 Unspecified abdominal pain: Secondary | ICD-10-CM

## 2014-01-21 NOTE — Telephone Encounter (Signed)
Told pt she was accepted back by Dr. Henrene Pastor.  Pt said she called back over to Dr. Kathline Magic office and got a colonoscopy set up.  Said she was afraid she would not be able to be seen soon.  Records will be shreaded

## 2014-01-29 ENCOUNTER — Other Ambulatory Visit: Payer: Self-pay | Admitting: Gastroenterology

## 2014-02-02 ENCOUNTER — Ambulatory Visit
Admission: RE | Admit: 2014-02-02 | Discharge: 2014-02-02 | Disposition: A | Discharge status: Home / Self Care | Payer: Commercial Managed Care - HMO | Source: Ambulatory Visit | Attending: Family Medicine | Admitting: Family Medicine

## 2014-02-02 DIAGNOSIS — R109 Unspecified abdominal pain: Secondary | ICD-10-CM

## 2014-02-09 ENCOUNTER — Ambulatory Visit: Payer: Commercial Managed Care - HMO | Admitting: Internal Medicine

## 2014-06-10 ENCOUNTER — Other Ambulatory Visit: Payer: Self-pay | Admitting: Gastroenterology

## 2014-10-22 ENCOUNTER — Encounter: Payer: Self-pay | Admitting: Internal Medicine

## 2014-10-23 ENCOUNTER — Encounter: Payer: Self-pay | Admitting: Internal Medicine

## 2014-11-20 ENCOUNTER — Other Ambulatory Visit: Payer: Self-pay | Admitting: Family Medicine

## 2014-11-20 ENCOUNTER — Ambulatory Visit
Admission: RE | Admit: 2014-11-20 | Discharge: 2014-11-20 | Disposition: A | Discharge status: Home / Self Care | Payer: PPO | Source: Ambulatory Visit | Attending: Family Medicine | Admitting: Family Medicine

## 2014-11-20 DIAGNOSIS — M25511 Pain in right shoulder: Secondary | ICD-10-CM

## 2014-11-20 DIAGNOSIS — W19XXXA Unspecified fall, initial encounter: Secondary | ICD-10-CM

## 2015-04-13 ENCOUNTER — Ambulatory Visit: Payer: PPO | Attending: Otolaryngology | Admitting: Physical Therapy

## 2015-04-13 DIAGNOSIS — H8112 Benign paroxysmal vertigo, left ear: Secondary | ICD-10-CM | POA: Insufficient documentation

## 2015-04-13 NOTE — Patient Instructions (Signed)
Self Treatment for Left Posterior / Anterior Canalithiasis    Sitting on bed: 1. Turn head 45 left. (a) Lie back slowly, shoulders on pillow, head on bed. (b) Hold ____ seconds. 2. Keeping head on bed, turn head 90 right. Hold ____ seconds. 3. Roll to right, head on 45 angle down toward bed. Hold ____ seconds. 4. Sit up on right side of bed. Repeat ____ times per session. Do ____ sessions per day.  Copyright  VHI. All rights reserved.  Benign Positional Vertigo Vertigo is the feeling that you or your surroundings are moving when they are not. Benign positional vertigo is the most common form of vertigo. The cause of this condition is not serious (is benign). This condition is triggered by certain movements and positions (is positional). This condition can be dangerous if it occurs while you are doing something that could endanger you or others, such as driving.  CAUSES In many cases, the cause of this condition is not known. It may be caused by a disturbance in an area of the inner ear that helps your brain to sense movement and balance. This disturbance can be caused by a viral infection (labyrinthitis), head injury, or repetitive motion. RISK FACTORS This condition is more likely to develop in:  Women.  People who are 80 years of age or older. SYMPTOMS Symptoms of this condition usually happen when you move your head or your eyes in different directions. Symptoms may start suddenly, and they usually last for less than a minute. Symptoms may include:  Loss of balance and falling.  Feeling like you are spinning or moving.  Feeling like your surroundings are spinning or moving.  Nausea and vomiting.  Blurred vision.  Dizziness.  Involuntary eye movement (nystagmus). Symptoms can be mild and cause only slight annoyance, or they can be severe and interfere with daily life. Episodes of benign positional vertigo may return (recur) over time, and they may be triggered by certain  movements. Symptoms may improve over time. DIAGNOSIS This condition is usually diagnosed by medical history and a physical exam of the head, neck, and ears. You may be referred to a health care provider who specializes in ear, nose, and throat (ENT) problems (otolaryngologist) or a provider who specializes in disorders of the nervous system (neurologist). You may have additional testing, including:  MRI.  A CT scan.  Eye movement tests. Your health care provider may ask you to change positions quickly while he or she watches you for symptoms of benign positional vertigo, such as nystagmus. Eye movement may be tested with an electronystagmogram (ENG), caloric stimulation, the Dix-Hallpike test, or the roll test.  An electroencephalogram (EEG). This records electrical activity in your brain.  Hearing tests. TREATMENT Usually, your health care provider will treat this by moving your head in specific positions to adjust your inner ear back to normal. Surgery may be needed in severe cases, but this is rare. In some cases, benign positional vertigo may resolve on its own in 2-4 weeks. HOME CARE INSTRUCTIONS Safety  Move slowly.Avoid sudden body or head movements.  Avoid driving.  Avoid operating heavy machinery.  Avoid doing any tasks that would be dangerous to you or others if a vertigo episode would occur.  If you have trouble walking or keeping your balance, try using a cane for stability. If you feel dizzy or unstable, sit down right away.  Return to your normal activities as told by your health care provider. Ask your health care provider what  activities are safe for you. General Instructions  Take over-the-counter and prescription medicines only as told by your health care provider.  Avoid certain positions or movements as told by your health care provider.  Drink enough fluid to keep your urine clear or pale yellow.  Keep all follow-up visits as told by your health care  provider. This is important. SEEK MEDICAL CARE IF:  You have a fever.  Your condition gets worse or you develop new symptoms.  Your family or friends notice any behavioral changes.  Your nausea or vomiting gets worse.  You have numbness or a "pins and needles" sensation. SEEK IMMEDIATE MEDICAL CARE IF:  You have difficulty speaking or moving.  You are always dizzy.  You faint.  You develop severe headaches.  You have weakness in your legs or arms.  You have changes in your hearing or vision.  You develop a stiff neck.  You develop sensitivity to light.   This information is not intended to replace advice given to you by your health care provider. Make sure you discuss any questions you have with your health care provider.   Document Released: 02/13/2006 Document Revised: 01/27/2015 Document Reviewed: 08/31/2014 Elsevier Interactive Patient Education [employer].

## 2015-04-18 ENCOUNTER — Encounter: Payer: Self-pay | Admitting: Physical Therapy

## 2015-04-18 NOTE — Therapy (Signed)
[facility] [address] [place], [place], [address] Phone: [phone]   Fax:  [phone]  Physical Therapy Evaluation  Patient Details  Name: Casey Kirk MRN: [id number] Date of Birth: 08/20/1945 Referring Provider: Dr. Melissa Montane  Encounter Date: 04/13/2015      PT End of Session - 04/18/15 2125    Visit Number 1   Number of Visits 2   Date for PT Re-Evaluation 05/13/15   PT Start Time 1315   PT Stop Time 1400   PT Time Calculation (min) 45 min      Past Medical History  Diagnosis Date  . TINEA CORPORIS 10/06/2009  . GOUT 01/09/2007  . BIPOLAR II DISORDER 03/17/2008  . COPD 12/21/2006  . ARTHRITIS, HX OF 01/09/2007  . Carcinoma of vaginal vault ([place])   . Hx of colonic polyps   . GERD (gastroesophageal reflux disease)   . Thyroid disease     Past Surgical History  Procedure Laterality Date  . Breast surgery      biopsy  . Abdominal hysterectomy      CA cells removed end of vagina  . Vaginal carcinoma surgery      There were no vitals filed for this visit.  Visit Diagnosis:  BPPV (benign paroxysmal positional vertigo), left - Plan: PT plan of care cert/re-cert      Subjective Assessment - 04/18/15 2118    Subjective Pt reports she has had the vertigo for about the past year but has gotten worse within past 3-4 months; unable to do exercise class for past 3 months - was doing Zumba class   Patient Stated Goals resolve the vertigo   Currently in Pain? No/denies            [facility] PT Assessment - 04/18/15 0001    Assessment   Medical Diagnosis BPPV   Referring Provider Dr. Melissa Montane   Onset Date/Surgical Date --  several months ago (approx. August 2016)   Balance Screen   Has the patient fallen in the past 6 months No   Has the patient had a decrease in activity level because of a fear of falling?  Yes   Is the patient reluctant to leave their home because of a fear of falling?  No   Prior Function   Level of Independence Independent            Vestibular Assessment - 04/18/15 0001    Symptom Behavior   Type of Dizziness Spinning   Frequency of Dizziness varies depending on movement   Duration of Dizziness secs to minutes   Aggravating Factors Rolling to left;Looking up to the ceiling   Relieving Factors Rest;Lying supine   Occulomotor Exam   Occulomotor Alignment Normal   Positional Testing   Dix-Hallpike Dix-Hallpike Left   Sidelying Test Sidelying Left   Dix-Hallpike Left   Dix-Hallpike Left Duration seconds   Dix-Hallpike Left Symptoms Upbeat, left rotatory nystagmus   Sidelying Left   Sidelying Left Duration secs   Sidelying Left Symptoms Upbeat, left rotatory nystagmus       Canalith repositioning - 3 reps of Epley maneuver for L BPPV - symptoms appeared to be resolved on 3rd rep                PT Education - 04/18/15 2123    Education provided Yes   Education Details self treatment for L BPPV   Person(s) Educated Patient   Methods Explanation;Handout;Demonstration   Comprehension Verbalized  understanding             PT Long Term Goals - 04/18/15 2129    PT LONG TERM GOAL #1   Title Pt will have a (-) L Dix-Hallpike test to indicate that L BPPV has resolved.  05-13-15   Time 4   Period Weeks   Status New   PT LONG TERM GOAL #2   Title Independent in HEP prn for habituation.  05-13-15   Time 4   Period Weeks   Status New   PT LONG TERM GOAL #3   Title Report no vertigo with bed mobility or with ambulation.  05-13-15   Time 4   Period Weeks   Status New               Plan - 04/18/15 2125    Clinical Impression Statement Pt had signs and symptoms consistent with L BPPV; appears to have resolved with treatment of Epley's maneuver   Pt will benefit from skilled therapeutic intervention in order to improve on the following deficits Dizziness;Difficulty walking;Decreased balance   Rehab Potential Good   PT  Frequency 1x / week   PT Duration 2 weeks   PT Treatment/Interventions Canalith Repostioning;ADLs/Self Care Home Management;Vestibular;Neuromuscular re-education;Patient/family education   PT Next Visit Plan check L Dix-Hallpike ; Epley's prn   PT Home Exercise Plan Brandt-Daroff prn   Consulted and Agree with Plan of Care Patient         Problem List Patient Active Problem List   Diagnosis Date Noted  . Tobacco abuse 05/10/2011  . Dysphasia 05/10/2011  . Hypothyroidism 10/17/2010  . BIPOLAR II DISORDER 03/17/2008  . GOUT 01/09/2007  . ARTHRITIS, HX OF 01/09/2007  . COPD 12/21/2006    Alda Lea, PT 04/18/2015, 9:36 PM  [facility] [address] [place], [place], [address] Phone: [phone]   Fax:  [phone]  Name: Casey Kirk MRN: [id number] Date of Birth: 10-13-1945

## 2015-05-10 ENCOUNTER — Encounter (HOSPITAL_COMMUNITY): Payer: Self-pay | Admitting: Cardiology

## 2015-05-10 ENCOUNTER — Emergency Department (HOSPITAL_COMMUNITY)
Admission: EM | Admit: 2015-05-10 | Discharge: 2015-05-10 | Disposition: A | Discharge status: Home / Self Care | Payer: PPO | Attending: Emergency Medicine | Admitting: Emergency Medicine

## 2015-05-10 ENCOUNTER — Ambulatory Visit: Payer: PPO | Admitting: Physical Therapy

## 2015-05-10 ENCOUNTER — Emergency Department (HOSPITAL_COMMUNITY): Payer: PPO

## 2015-05-10 DIAGNOSIS — Z79899 Other long term (current) drug therapy: Secondary | ICD-10-CM | POA: Diagnosis not present

## 2015-05-10 DIAGNOSIS — Z7951 Long term (current) use of inhaled steroids: Secondary | ICD-10-CM | POA: Diagnosis not present

## 2015-05-10 DIAGNOSIS — R079 Chest pain, unspecified: Secondary | ICD-10-CM | POA: Diagnosis present

## 2015-05-10 DIAGNOSIS — Z8544 Personal history of malignant neoplasm of other female genital organs: Secondary | ICD-10-CM | POA: Diagnosis not present

## 2015-05-10 DIAGNOSIS — R0789 Other chest pain: Secondary | ICD-10-CM

## 2015-05-10 DIAGNOSIS — M1990 Unspecified osteoarthritis, unspecified site: Secondary | ICD-10-CM | POA: Diagnosis not present

## 2015-05-10 DIAGNOSIS — J449 Chronic obstructive pulmonary disease, unspecified: Secondary | ICD-10-CM

## 2015-05-10 DIAGNOSIS — Z8639 Personal history of other endocrine, nutritional and metabolic disease: Secondary | ICD-10-CM | POA: Diagnosis not present

## 2015-05-10 DIAGNOSIS — Z91040 Latex allergy status: Secondary | ICD-10-CM | POA: Insufficient documentation

## 2015-05-10 DIAGNOSIS — F3181 Bipolar II disorder: Secondary | ICD-10-CM | POA: Diagnosis not present

## 2015-05-10 DIAGNOSIS — Z8619 Personal history of other infectious and parasitic diseases: Secondary | ICD-10-CM | POA: Insufficient documentation

## 2015-05-10 DIAGNOSIS — Z7982 Long term (current) use of aspirin: Secondary | ICD-10-CM | POA: Insufficient documentation

## 2015-05-10 DIAGNOSIS — Z86010 Personal history of colonic polyps: Secondary | ICD-10-CM | POA: Insufficient documentation

## 2015-05-10 DIAGNOSIS — F17210 Nicotine dependence, cigarettes, uncomplicated: Secondary | ICD-10-CM | POA: Insufficient documentation

## 2015-05-10 DIAGNOSIS — K219 Gastro-esophageal reflux disease without esophagitis: Secondary | ICD-10-CM | POA: Diagnosis not present

## 2015-05-10 LAB — BASIC METABOLIC PANEL
Anion gap: 9 (ref 5–15)
BUN: 7 mg/dL (ref 6–20)
CO2: 25 mmol/L (ref 22–32)
Calcium: 9.2 mg/dL (ref 8.9–10.3)
Chloride: 109 mmol/L (ref 101–111)
Creatinine, Ser: 0.77 mg/dL (ref 0.44–1.00)
GFR calc Af Amer: >60 mL/min (ref >60)
GFR calc non Af Amer: >60 mL/min (ref >60)
Glucose, Bld: 102 mg/dL — ABNORMAL HIGH (ref 65–99)
Potassium: 3.6 mmol/L (ref 3.5–5.1)
Sodium: 143 mmol/L (ref 135–145)

## 2015-05-10 LAB — I-STAT TROPONIN, ED
Troponin i, poc: 0 ng/mL (ref 0.00–0.08)
Troponin i, poc: 0 ng/mL (ref 0.00–0.08)

## 2015-05-10 LAB — CBC
HCT: 45.5 % (ref 36.0–46.0)
Hemoglobin: 14.8 g/dL (ref 12.0–15.0)
MCH: 29.5 pg (ref 26.0–34.0)
MCHC: 32.5 g/dL (ref 30.0–36.0)
MCV: 90.8 fL (ref 78.0–100.0)
Platelets: 222 10*3/uL (ref 150–400)
RBC: 5.01 MIL/uL (ref 3.87–5.11)
RDW: 13.5 % (ref 11.5–15.5)
WBC: 10 10*3/uL (ref 4.0–10.5)

## 2015-05-10 MED ORDER — GI COCKTAIL ~~LOC~~
30.0000 mL | Freq: Once | ORAL | Status: AC
  Administered 2015-05-10: 30 mL via ORAL
  Filled 2015-05-10: qty 30

## 2015-05-10 MED ORDER — ACETAMINOPHEN 325 MG PO TABS
650.0000 mg | ORAL_TABLET | Freq: Once | ORAL | Status: AC
  Administered 2015-05-10: 650 mg via ORAL
  Filled 2015-05-10: qty 2

## 2015-05-10 MED ORDER — FAMOTIDINE 20 MG PO TABS
20.0000 mg | ORAL_TABLET | Freq: Once | ORAL | Status: AC
  Administered 2015-05-10: 20 mg via ORAL
  Filled 2015-05-10: qty 1

## 2015-05-10 NOTE — ED Provider Notes (Signed)
CSN: [id number]     Arrival date & time 05/10/15  1515 History   First MD Initiated Contact with Patient 05/10/15 1745     Chief Complaint  Patient presents with  . Chest Pain     (Consider location/radiation/quality/duration/timing/severity/associated sxs/prior Treatment) Patient is a 69 y.o. female presenting with chest pain. The history is provided by the patient.  Chest Pain Associated symptoms: cough   Associated symptoms: no abdominal pain, no back pain, no fever, no headache, no palpitations, no shortness of breath and not vomiting   Patient c/o pain in mid chest for the past 2 days. Constant. Dull. Mild-moderate. At rest. Pt states that is really more of a soreness than a pain. No relation to position. No change w activity or exertion. No associated sob, nv or diaphoresis. Chronic intermittent cough, no recent increase in coughing. No sore throat, runny nose, or other uri c/o. No fever or chills. No heartburn. Hx gerd.  No leg pain or swelling. No pleuritic pain. Denies chest wall injury or fall.       Past Medical History  Diagnosis Date  . TINEA CORPORIS 10/06/2009  . GOUT 01/09/2007  . BIPOLAR II DISORDER 03/17/2008  . COPD 12/21/2006  . ARTHRITIS, HX OF 01/09/2007  . 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 Hx of colonic polyps   . GERD (gastroesophageal reflux disease)   . Thyroid disease    Past Surgical History  Procedure Laterality Date  . Breast surgery      biopsy  . Abdominal hysterectomy      CA cells removed end of vagina  . Vaginal carcinoma surgery     Family History  Problem Relation Age of Onset  . Alcohol abuse Other   . Arthritis Other   . Diabetes Other   . Hyperlipidemia Other   . Hypertension Other   . Cancer Other     ovarian, uterine  . Heart disease Other   . Sudden death Other   . Colon polyps Mother   . Colon polyps Brother   . Colon cancer Neg Hx   . Tremor Mother    Social History  Substance Use Topics  . Smoking status: Current  Every Day Smoker -- 0.75 packs/day for 40 years    Types: Cigarettes  . Smokeless tobacco: Never Used     Comment: Counseling sheet to quit smoking given in exam room   . Alcohol Use: Yes     Comment: Rarely    OB History    No data available     Review of Systems  Constitutional: Negative for fever and chills.  HENT: Negative for sore throat.   Eyes: Negative for redness.  Respiratory: Positive for cough. Negative for shortness of breath.   Cardiovascular: Positive for chest pain. Negative for palpitations and leg swelling.  Gastrointestinal: Negative for vomiting, abdominal pain and diarrhea.  Genitourinary: Negative for dysuria and flank pain.  Musculoskeletal: Negative for back pain and neck pain.  Skin: Negative for rash.  Neurological: Negative for headaches.  Hematological: Does not bruise/bleed easily.  Psychiatric/Behavioral: Negative for confusion.      Allergies  Hydrocodone-acetaminophen and Latex  Home Medications   Prior to Admission medications   Medication Sig Start Date End Date Taking? Authorizing Provider  aspirin 81 MG tablet Take 81 mg by mouth daily.      Historical Provider, MD  calcium citrate-vitamin D (CITRACAL+D) 315-200 MG-UNIT per tablet Take 1 tablet by mouth daily.  Historical Provider, MD  fish oil-omega-3 fatty acids 1000 MG capsule Take 2 g by mouth daily.      Historical Provider, MD  fluticasone (FLONASE) 50 MCG/ACT nasal spray Place 2 sprays into the nose daily. 09/28/11 09/27/12  Hayden Rasmussen, MD  hydrochlorothiazide (HYDRODIURIL) 25 MG tablet 2 stat then 1 qd 05/10/11   Dorena Cookey, MD  lamoTRIgine (LAMICTAL) 25 MG tablet Take 25 mg by mouth daily.      Historical Provider, MD  Multiple Vitamin (MULTIVITAMIN) tablet Take 1 tablet by mouth daily.      Historical Provider, MD  omeprazole (PRILOSEC) 40 MG capsule Take 1 capsule (40 mg total) by mouth daily. 06/06/11 06/05/12  Irene Shipper, MD  vitamin C (ASCORBIC ACID) 500 MG tablet  Take 500 mg by mouth daily.      Historical Provider, MD   BP 144/69 mmHg  Pulse 70  Temp(Src) 98.2 F (36.8 C) (Oral)  Resp 16  SpO2 100% Physical Exam  Constitutional: She appears well-developed and well-nourished. No distress.  HENT:  Mouth/Throat: Oropharynx is clear and moist.  Eyes: Conjunctivae are normal. No scleral icterus.  Neck: Neck supple. No JVD present. No tracheal deviation present.  Cardiovascular: Normal rate, regular rhythm, normal heart sounds and intact distal pulses.  Exam reveals no gallop and no friction rub.   No murmur heard. Pulmonary/Chest: Effort normal and breath sounds normal. No respiratory distress. She exhibits tenderness.  +chest wall tenderness, reproducing symptoms. No crepitus, normal chest wall movement  Abdominal: Soft. Normal appearance. She exhibits no distension. There is no tenderness.  Musculoskeletal: She exhibits no edema or tenderness.  Neurological: She is alert.  Skin: Skin is warm and dry. No rash noted.  Psychiatric: She has a normal mood and affect.  Nursing note and vitals reviewed.   ED Course  Procedures (including critical care time) Labs Review   Results for orders placed or performed during the hospital encounter of [id number]  Basic metabolic panel  Result Value Ref Range   Sodium 143 135 - 145 mmol/L   Potassium 3.6 3.5 - 5.1 mmol/L   Chloride 109 101 - 111 mmol/L   CO2 25 22 - 32 mmol/L   Glucose, Bld 102 (H) 65 - 99 mg/dL   BUN 7 6 - 20 mg/dL   Creatinine, Ser 0.77 0.44 - 1.00 mg/dL   Calcium 9.2 8.9 - 10.3 mg/dL   GFR calc non Af Amer >60 >60 mL/min   GFR calc Af Amer >60 >60 mL/min   Anion gap 9 5 - 15  CBC  Result Value Ref Range   WBC 10.0 4.0 - 10.5 K/uL   RBC 5.01 3.87 - 5.11 MIL/uL   Hemoglobin 14.8 12.0 - 15.0 g/dL   HCT 45.5 36.0 - 46.0 %   MCV 90.8 78.0 - 100.0 fL   MCH 29.5 26.0 - 34.0 pg   MCHC 32.5 30.0 - 36.0 g/dL   RDW 13.5 11.5 - 15.5 %   Platelets 222 150 - 400 K/uL  I-stat troponin,  ED (not at [facility], [facility])  Result Value Ref Range   Troponin i, poc 0.00 0.00 - 0.08 ng/mL   Comment 3          I-stat troponin, ED  Result Value Ref Range   Troponin i, poc 0.00 0.00 - 0.08 ng/mL   Comment 3           Dg Chest 2 View  05/10/2015  CLINICAL DATA:  Chest pain  for several days, history of COPD EXAM: CHEST  2 VIEW COMPARISON:  10/15/2014 FINDINGS: Heart size and vascular pattern are normal. No consolidation or effusion. Stable hyperinflation. IMPRESSION: COPD with no acute finding Electronically Signed   By: Skipper Cliche M.D.   On: 05/10/2015 15:59         I have personally reviewed and evaluated these images and lab results as part of my medical decision-making.   EKG Interpretation   Date/Time:  Monday May 10 2015 15:18:37 EST Ventricular Rate:  76 PR Interval:  152 QRS Duration: 84 QT Interval:  406 QTC Calculation: 456 R Axis:   79 Text Interpretation:  Normal sinus rhythm Nonspecific T wave abnormality  Confirmed by Ashok Cordia  MD, Lennette Bihari ([id number]) on 05/10/2015 5:39:42 PM      MDM   Iv ns. Labs. Cxr.  Reviewed nursing notes and prior charts for additional history.   Initial and delta trop neg.   Pts symptoms present for the past couple days.  Troponins neg.  Recheck pt breathing comfortably, no distress, and currently appears stable for d/c.       Lajean Saver, MD 05/10/15 2028

## 2015-05-10 NOTE — Discharge Instructions (Signed)
It was our pleasure to provide your ER care today - we hope that you feel better.  Continue prilosec.  You may also try pepcid and maalox as need if GI/reflux symptoms.   You may also try taking tylenol/advil as need.  Follow up with your primary care doctor in the next few days.  Return to ER if worse, new symptoms, difficulty breathing, fevers, persistent/recurrent chest pain, other concern.   Nonspecific Chest Pain  Chest pain can be caused by many different conditions. There is always a chance that your pain could be related to something serious, such as a heart attack or a blood clot in your lungs. Chest pain can also be caused by conditions that are not life-threatening. If you have chest pain, it is very important to follow up with your health care provider. CAUSES  Chest pain can be caused by:  Heartburn.  Pneumonia or bronchitis.  Anxiety or stress.  Inflammation around your heart (pericarditis) or lung (pleuritis or pleurisy).  A blood clot in your lung.  A collapsed lung (pneumothorax). It can develop suddenly on its own (spontaneous pneumothorax) or from trauma to the chest.  Shingles infection (varicella-zoster virus).  Heart attack.  Damage to the bones, muscles, and cartilage that make up your chest wall. This can include:  Bruised bones due to injury.  Strained muscles or cartilage due to frequent or repeated coughing or overwork.  Fracture to one or more ribs.  Sore cartilage due to inflammation (costochondritis). RISK FACTORS  Risk factors for chest pain may include:  Activities that increase your risk for trauma or injury to your chest.  Respiratory infections or conditions that cause frequent coughing.  Medical conditions or overeating that can cause heartburn.  Heart disease or family history of heart disease.  Conditions or health behaviors that increase your risk of developing a blood clot.  Having had chicken pox (varicella zoster). SIGNS  AND SYMPTOMS Chest pain can feel like:  Burning or tingling on the surface of your chest or deep in your chest.  Crushing, pressure, aching, or squeezing pain.  Dull or sharp pain that is worse when you move, cough, or take a deep breath.  Pain that is also felt in your back, neck, shoulder, or arm, or pain that spreads to any of these areas. Your chest pain may come and go, or it may stay constant. DIAGNOSIS Lab tests or other studies may be needed to find the cause of your pain. Your health care provider may have you take a test called an ambulatory ECG (electrocardiogram). An ECG records your heartbeat patterns at the time the test is performed. You may also have other tests, such as:  Transthoracic echocardiogram (TTE). During echocardiography, sound waves are used to create a picture of all of the heart structures and to look at how blood flows through your heart.  Transesophageal echocardiogram (TEE).This is a more advanced imaging test that obtains images from inside your body. It allows your health care provider to see your heart in finer detail.  Cardiac monitoring. This allows your health care provider to monitor your heart rate and rhythm in real time.  Holter monitor. This is a portable device that records your heartbeat and can help to diagnose abnormal heartbeats. It allows your health care provider to track your heart activity for several days, if needed.  Stress tests. These can be done through exercise or by taking medicine that makes your heart beat more quickly.  Blood tests.  Imaging  tests. TREATMENT  Your treatment depends on what is causing your chest pain. Treatment may include:  Medicines. These may include:  Acid blockers for heartburn.  Anti-inflammatory medicine.  Pain medicine for inflammatory conditions.  Antibiotic medicine, if an infection is present.  Medicines to dissolve blood clots.  Medicines to treat coronary artery  disease.  Supportive care for conditions that do not require medicines. This may include:  Resting.  Applying heat or cold packs to injured areas.  Limiting activities until pain decreases. HOME CARE INSTRUCTIONS  If you were prescribed an antibiotic medicine, finish it all even if you start to feel better.  Avoid any activities that bring on chest pain.  Do not use any tobacco products, including cigarettes, chewing tobacco, or electronic cigarettes. If you need help quitting, ask your health care provider.  Do not drink alcohol.  Take medicines only as directed by your health care provider.  Keep all follow-up visits as directed by your health care provider. This is important. This includes any further testing if your chest pain does not go away.  If heartburn is the cause for your chest pain, you may be told to keep your head raised (elevated) while sleeping. This reduces the chance that acid will go from your stomach into your esophagus.  Make lifestyle changes as directed by your health care provider. These may include:  Getting regular exercise. Ask your health care provider to suggest some activities that are safe for you.  Eating a heart-healthy diet. A registered dietitian can help you to learn healthy eating options.  Maintaining a healthy weight.  Managing diabetes, if necessary.  Reducing stress. SEEK MEDICAL CARE IF:  Your chest pain does not go away after treatment.  You have a rash with blisters on your chest.  You have a fever. SEEK IMMEDIATE MEDICAL CARE IF:   Your chest pain is worse.  You have an increasing cough, or you cough up blood.  You have severe abdominal pain.  You have severe weakness.  You faint.  You have chills.  You have sudden, unexplained chest discomfort.  You have sudden, unexplained discomfort in your arms, back, neck, or jaw.  You have shortness of breath at any time.  You suddenly start to sweat, or your skin gets  clammy.  You feel nauseous or you vomit.  You suddenly feel light-headed or dizzy.  Your heart begins to beat quickly, or it feels like it is skipping beats. These symptoms may represent a serious problem that is an emergency. Do not wait to see if the symptoms will go away. Get medical help right away. Call your local emergency services (911 in the [place] Do not drive yourself to the hospital.   This information is not intended to replace advice given to you by your health care provider. Make sure you discuss any questions you have with your health care provider.   Document Released: 02/15/2005 Document Revised: 05/29/2014 Document Reviewed: 12/12/2013 Elsevier Interactive Patient Education 2016 [employer]  Chest Wall Pain Chest wall pain is pain in or around the bones and muscles of your chest. Sometimes, an injury causes this pain. Sometimes, the cause may not be known. This pain may take several weeks or longer to get better. HOME CARE INSTRUCTIONS  Pay attention to any changes in your symptoms. Take these actions to help with your pain:   Rest as told by your health care provider.   Avoid activities that cause pain. These include any activities that use  your chest muscles or your abdominal and side muscles to lift heavy items.   If directed, apply ice to the painful area:  Put ice in a plastic bag.  Place a towel between your skin and the bag.  Leave the ice on for 20 minutes, 2-3 times per day.  Take over-the-counter and prescription medicines only as told by your health care provider.  Do not use tobacco products, including cigarettes, chewing tobacco, and e-cigarettes. If you need help quitting, ask your health care provider.  Keep all follow-up visits as told by your health care provider. This is important. SEEK MEDICAL CARE IF:  You have a fever.  Your chest pain becomes worse.  You have new symptoms. SEEK IMMEDIATE MEDICAL CARE IF:  You have nausea or  vomiting.  You feel sweaty or light-headed.  You have a cough with phlegm (sputum) or you cough up blood.  You develop shortness of breath.   This information is not intended to replace advice given to you by your health care provider. Make sure you discuss any questions you have with your health care provider.   Document Released: 05/08/2005 Document Revised: 01/27/2015 Document Reviewed: 08/03/2014 Elsevier Interactive Patient Education 2016 [employer].   Gastroesophageal Reflux Disease, Adult Normally, food travels down the esophagus and stays in the stomach to be digested. However, when a person has gastroesophageal reflux disease (GERD), food and stomach acid move back up into the esophagus. When this happens, the esophagus becomes sore and inflamed. Over time, GERD can create small holes (ulcers) in the lining of the esophagus.  CAUSES This condition is caused by a problem with the muscle between the esophagus and the stomach (lower esophageal sphincter, or LES). Normally, the LES muscle closes after food passes through the esophagus to the stomach. When the LES is weakened or abnormal, it does not close properly, and that allows food and stomach acid to go back up into the esophagus. The LES can be weakened by certain dietary substances, medicines, and medical conditions, including:  Tobacco use.  Pregnancy.  Having a hiatal hernia.  Heavy alcohol use.  Certain foods and beverages, such as coffee, chocolate, onions, and peppermint. RISK FACTORS This condition is more likely to develop in:  People who have an increased body weight.  People who have connective tissue disorders.  People who use NSAID medicines. SYMPTOMS Symptoms of this condition include:  Heartburn.  Difficult or painful swallowing.  The feeling of having a lump in the throat.  Abitter taste in the mouth.  Bad breath.  Having a large amount of saliva.  Having an upset or bloated  stomach.  Belching.  Chest pain.  Shortness of breath or wheezing.  Ongoing (chronic) cough or a night-time cough.  Wearing away of tooth enamel.  Weight loss. Different conditions can cause chest pain. Make sure to see your health care provider if you experience chest pain. DIAGNOSIS Your health care provider will take a medical history and perform a physical exam. To determine if you have mild or severe GERD, your health care provider may also monitor how you respond to treatment. You may also have other tests, including:  An endoscopy toexamine your stomach and esophagus with a small camera.  A test thatmeasures the acidity level in your esophagus.  A test thatmeasures how much pressure is on your esophagus.  A barium swallow or modified barium swallow to show the shape, size, and functioning of your esophagus. TREATMENT The goal of treatment is to  help relieve your symptoms and to prevent complications. Treatment for this condition may vary depending on how severe your symptoms are. Your health care provider may recommend:  Changes to your diet.  Medicine.  Surgery. HOME CARE INSTRUCTIONS Diet  Follow a diet as recommended by your health care provider. This may involve avoiding foods and drinks such as:  Coffee and tea (with or without caffeine).  Drinks that containalcohol.  Energy drinks and sports drinks.  Carbonated drinks or sodas.  Chocolate and cocoa.  Peppermint and mint flavorings.  Garlic and onions.  Horseradish.  Spicy and acidic foods, including peppers, chili powder, curry powder, vinegar, hot sauces, and barbecue sauce.  Citrus fruit juices and citrus fruits, such as oranges, lemons, and limes.  Tomato-based foods, such as red sauce, chili, salsa, and pizza with red sauce.  Fried and fatty foods, such as donuts, french fries, potato chips, and high-fat dressings.  High-fat meats, such as hot dogs and fatty cuts of red and white  meats, such as rib eye steak, sausage, ham, and bacon.  High-fat dairy items, such as whole milk, butter, and cream cheese.  Eat small, frequent meals instead of large meals.  Avoid drinking large amounts of liquid with your meals.  Avoid eating meals during the 2-3 hours before bedtime.  Avoid lying down right after you eat.  Do not exercise right after you eat. General Instructions  Pay attention to any changes in your symptoms.  Take over-the-counter and prescription medicines only as told by your health care provider. Do not take aspirin, ibuprofen, or other NSAIDs unless your health care provider told you to do so.  Do not use any tobacco products, including cigarettes, chewing tobacco, and e-cigarettes. If you need help quitting, ask your health care provider.  Wear loose-fitting clothing. Do not wear anything tight around your waist that causes pressure on your abdomen.  Raise (elevate) the head of your bed 6 inches (15cm).  Try to reduce your stress, such as with yoga or meditation. If you need help reducing stress, ask your health care provider.  If you are overweight, reduce your weight to an amount that is healthy for you. Ask your health care provider for guidance about a safe weight loss goal.  Keep all follow-up visits as told by your health care provider. This is important. SEEK MEDICAL CARE IF:  You have new symptoms.  You have unexplained weight loss.  You have difficulty swallowing, or it hurts to swallow.  You have wheezing or a persistent cough.  Your symptoms do not improve with treatment.  You have a hoarse voice. SEEK IMMEDIATE MEDICAL CARE IF:  You have pain in your arms, neck, jaw, teeth, or back.  You feel sweaty, dizzy, or light-headed.  You have chest pain or shortness of breath.  You vomit and your vomit looks like blood or coffee grounds.  You faint.  Your stool is bloody or black.  You cannot swallow, drink, or eat.   This  information is not intended to replace advice given to you by your health care provider. Make sure you discuss any questions you have with your health care provider.   Document Released: 02/15/2005 Document Revised: 01/27/2015 Document Reviewed: 09/02/2014 Elsevier Interactive Patient Education 2016 [employer].    Smoking Hazards Smoking cigarettes is extremely bad for your health. Tobacco smoke has over 200 known poisons in it. It contains the poisonous gases nitrogen oxide and carbon monoxide. There are over 60 chemicals in tobacco smoke  that cause cancer. Some of the chemicals found in cigarette smoke include:   Cyanide.   Benzene.   Formaldehyde.   Methanol (wood alcohol).   Acetylene (fuel used in welding torches).   Ammonia.  Even smoking lightly shortens your life expectancy by several years. You can greatly reduce the risk of medical problems for you and your family by stopping now. Smoking is the most preventable cause of death and disease in our society. Within days of quitting smoking, your circulation improves, you decrease the risk of having a heart attack, and your lung capacity improves. There may be some increased phlegm in the first few days after quitting, and it may take months for your lungs to clear up completely. Quitting for 10 years reduces your risk of developing lung cancer to almost that of a nonsmoker.  WHAT ARE THE RISKS OF SMOKING? Cigarette smokers have an increased risk of many serious medical problems, including:  Lung cancer.   Lung disease (such as pneumonia, bronchitis, and emphysema).   Heart attack and chest pain due to the heart not getting enough oxygen (angina).   Heart disease and peripheral blood vessel disease.   Hypertension.   Stroke.   Oral cancer (cancer of the lip, mouth, or voice box).   Bladder cancer.   Pancreatic cancer.   Cervical cancer.   Pregnancy complications, including premature birth.    Stillbirths and smaller newborn babies, birth defects, and genetic damage to sperm.   Early menopause.   Lower estrogen level for women.   Infertility.   Facial wrinkles.   Blindness.   Increased risk of broken bones (fractures).   Senile dementia.   Stomach ulcers and internal bleeding.   Delayed wound healing and increased risk of complications during surgery. Because of secondhand smoke exposure, children of smokers have an increased risk of the following:   Sudden infant death syndrome (SIDS).   Respiratory infections.   Lung cancer.   Heart disease.   Ear infections.  WHY IS SMOKING ADDICTIVE? Nicotine is the chemical agent in tobacco that is capable of causing addiction or dependence. When you smoke and inhale, nicotine is absorbed rapidly into the bloodstream through your lungs. Both inhaled and noninhaled nicotine may be addictive.  WHAT ARE THE BENEFITS OF QUITTING?  There are many health benefits to quitting smoking. Some are:   The likelihood of developing cancer and heart disease decreases. Health improvements are seen almost immediately.   Blood pressure, pulse rate, and breathing patterns start returning to normal soon after quitting.   People who quit may see an improvement in their overall quality of life.  HOW DO YOU QUIT SMOKING? Smoking is an addiction with both physical and psychological effects, and longtime habits can be hard to change. Your health care provider can recommend:  Programs and community resources, which may include group support, education, or therapy.  Replacement products, such as patches, gum, and nasal sprays. Use these products only as directed. Do not replace cigarette smoking with electronic cigarettes (commonly called e-cigarettes). The safety of e-cigarettes is unknown, and some may contain harmful chemicals. FOR MORE INFORMATION  American Lung Association: www.lung.org  American Cancer Society:  www.cancer.org   This information is not intended to replace advice given to you by your health care provider. Make sure you discuss any questions you have with your health care provider.   Document Released: 06/15/2004 Document Revised: 02/26/2013 Document Reviewed: 10/28/2012 Elsevier Interactive Patient Education [employer].

## 2015-05-10 NOTE — ED Notes (Signed)
Pt reports left sided chest pain for the past couple of days with nausea and SOB.

## 2015-06-02 DIAGNOSIS — J320 Chronic maxillary sinusitis: Secondary | ICD-10-CM | POA: Diagnosis not present

## 2015-09-28 DIAGNOSIS — F329 Major depressive disorder, single episode, unspecified: Secondary | ICD-10-CM | POA: Diagnosis not present

## 2015-10-12 DIAGNOSIS — Z01411 Encounter for gynecological examination (general) (routine) with abnormal findings: Secondary | ICD-10-CM | POA: Diagnosis not present

## 2015-10-12 DIAGNOSIS — N842 Polyp of vagina: Secondary | ICD-10-CM | POA: Diagnosis not present

## 2015-10-14 DIAGNOSIS — M7062 Trochanteric bursitis, left hip: Secondary | ICD-10-CM | POA: Diagnosis not present

## 2015-11-02 DIAGNOSIS — M85851 Other specified disorders of bone density and structure, right thigh: Secondary | ICD-10-CM | POA: Diagnosis not present

## 2015-11-02 DIAGNOSIS — Z1231 Encounter for screening mammogram for malignant neoplasm of breast: Secondary | ICD-10-CM | POA: Diagnosis not present

## 2015-11-11 DIAGNOSIS — J441 Chronic obstructive pulmonary disease with (acute) exacerbation: Secondary | ICD-10-CM | POA: Diagnosis not present

## 2015-11-11 DIAGNOSIS — Z720 Tobacco use: Secondary | ICD-10-CM | POA: Diagnosis not present

## 2016-01-10 DIAGNOSIS — F329 Major depressive disorder, single episode, unspecified: Secondary | ICD-10-CM | POA: Diagnosis not present

## 2016-02-22 DIAGNOSIS — F3181 Bipolar II disorder: Secondary | ICD-10-CM | POA: Diagnosis not present

## 2016-02-22 DIAGNOSIS — M79671 Pain in right foot: Secondary | ICD-10-CM | POA: Diagnosis not present

## 2016-02-22 DIAGNOSIS — H938X1 Other specified disorders of right ear: Secondary | ICD-10-CM | POA: Diagnosis not present

## 2016-02-22 DIAGNOSIS — Z0000 Encounter for general adult medical examination without abnormal findings: Secondary | ICD-10-CM | POA: Diagnosis not present

## 2016-02-22 DIAGNOSIS — J449 Chronic obstructive pulmonary disease, unspecified: Secondary | ICD-10-CM | POA: Diagnosis not present

## 2016-02-22 DIAGNOSIS — Z23 Encounter for immunization: Secondary | ICD-10-CM | POA: Diagnosis not present

## 2016-02-22 DIAGNOSIS — M810 Age-related osteoporosis without current pathological fracture: Secondary | ICD-10-CM | POA: Diagnosis not present

## 2016-02-22 DIAGNOSIS — Z131 Encounter for screening for diabetes mellitus: Secondary | ICD-10-CM | POA: Diagnosis not present

## 2016-03-01 ENCOUNTER — Institutional Professional Consult (permissible substitution): Payer: PPO | Admitting: Pulmonary Disease

## 2016-03-16 ENCOUNTER — Ambulatory Visit (INDEPENDENT_AMBULATORY_CARE_PROVIDER_SITE_OTHER): Payer: PPO | Admitting: Pulmonary Disease

## 2016-03-16 ENCOUNTER — Encounter: Payer: Self-pay | Admitting: Pulmonary Disease

## 2016-03-16 VITALS — BP 122/82 | HR 61 | Ht 63.0 in | Wt 142.4 lb

## 2016-03-16 DIAGNOSIS — Z720 Tobacco use: Secondary | ICD-10-CM | POA: Diagnosis not present

## 2016-03-16 DIAGNOSIS — J439 Emphysema, unspecified: Secondary | ICD-10-CM | POA: Diagnosis not present

## 2016-03-16 DIAGNOSIS — J441 Chronic obstructive pulmonary disease with (acute) exacerbation: Secondary | ICD-10-CM

## 2016-03-16 DIAGNOSIS — J432 Centrilobular emphysema: Secondary | ICD-10-CM | POA: Diagnosis not present

## 2016-03-16 NOTE — Assessment & Plan Note (Signed)
Smoking cessation was encouraged Review spirometry results to motivate her to quit  Chantix was contraindicated due to bipolar disorder. She will try Nicorette gum

## 2016-03-16 NOTE — Progress Notes (Signed)
Subjective:    Patient ID: Casey Kirk, female    DOB: [birth year], 70 y.o.   MRN: [id number]  HPI  70 year old smoker referred for management of COPD She smokes about a pack per day starting in her teens and has recently cut down to about half pack per day with the aid of Nicorette gum-about 40 pack years She reports dyspnea on exertion and is hardly able to walk a block or 2. She has bipolar disorder and halting speech She has recurrent sinus infection and has an appointment with ENT. She reports occasional wheezing and reports early morning chronic cough productive of rusty brown sputum. She denies fevers, body aches or weight loss. She used to be on Advair and this was recently changed to [place] she has not been taking this. She uses albuterol as a rescue inhaler, more recently almost daily.  She reports chest colds about once or twice a year but has never required ED visits or hospitalizations.  Chest x-ray 04/2015 shows hyperinflation without infiltrates or effusions Spirometry 02/2016-surprisingly shows normal ratio 72 and normal lung function FEV1 of 98%       Past Medical History:  Diagnosis Date  . Allergic rhinitis   . ARTHRITIS, HX OF 01/09/2007  . BIPOLAR II DISORDER 03/17/2008  . 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 COPD 12/21/2006  . GERD (gastroesophageal reflux disease)   . GOUT 01/09/2007  . Hx of colonic polyps   . IBS (irritable bowel syndrome)   . Osteoporosis   . Sinusitis   . Thyroid disease   . TINEA CORPORIS 10/06/2009     Past Surgical History:  Procedure Laterality Date  . ABDOMINAL HYSTERECTOMY     CA cells removed end of vagina  . BREAST SURGERY     biopsy  . vaginal carcinoma surgery      Allergies  Allergen Reactions  . Hydrocodone-Acetaminophen Itching and Swelling  . Latex Itching and Swelling     Social History   Social History  . Marital status: Married    Spouse name: N/A  . Number of children: 2  . Years of  education: N/A   Occupational History  . retired    Social History Main Topics  . Smoking status: Current Every Day Smoker    Packs/day: 0.75    Years: 40.00    Types: Cigarettes  . Smokeless tobacco: Never Used     Comment: Counseling sheet to quit smoking given in exam room   . Alcohol use Yes     Comment: Rarely   . Drug use: No  . Sexual activity: Yes   Other Topics Concern  . Not on file   Social History Narrative   Lives alone.  She has two grown children.   She is retired from working at [employer].   Highest level of education:  2 year business college     Family History  Problem Relation Age of Onset  . Alcohol abuse Other   . Arthritis Other   . Diabetes Other   . Hyperlipidemia Other   . Hypertension Other   . Cancer Other     ovarian, uterine  . Heart disease Other   . Sudden death Other   . Colon polyps Mother   . Colon polyps Brother   . Colon cancer Neg Hx   . Tremor Mother      Review of Systems   Positive for postnasal drip, halting speech  neg for any significant sore throat,  dysphagia, itching, sneezing, nasal congestion or excess/ purulent secretions, fever, chills, sweats, unintended wt loss, pleuritic or exertional cp, hempoptysis, orthopnea pnd or change in chronic leg swelling.    Also denies presyncope, palpitations, heartburn, abdominal pain, nausea, vomiting, diarrhea or change in bowel or urinary habits, dysuria,hematuria, rash, arthralgias, visual complaints, headache, numbness weakness or ataxia.     Objective:   Physical Exam  Gen. Pleasant, well-nourished, in no distress, normal affect ENT - no lesions, no post nasal drip Neck: No JVD, no thyromegaly, no carotid bruits Lungs: no use of accessory muscles, no dullness to percussion, decreased without rales or rhonchi  Cardiovascular: Rhythm regular, heart sounds  normal, no murmurs or gallops, no peripheral edema Abdomen: soft and non-tender, no hepatosplenomegaly, BS  normal. Musculoskeletal: No deformities, no cyanosis or clubbing Neuro:  alert, non focal       Assessment & Plan:

## 2016-03-16 NOTE — Patient Instructions (Addendum)
Breathing test shows lung function is good  You have to quit smoking!  Referral to pulmonary rehabilitation program Continue spiriva - 1 puff daily as your maintenance medication Continue to use albuterol MDI 2 puffs as needed for shortness of breath or wheezing Take Mucinex to assist with clearing mucus  We will refer you for lung cancer screening program

## 2016-03-16 NOTE — Addendum Note (Signed)
Addended by: Mathis Bud on: 03/16/2016 10:04 AM   Modules accepted: Orders

## 2016-03-16 NOTE — Assessment & Plan Note (Signed)
Breathing test shows lung function is good  You have to quit smoking!  Referral to pulmonary rehabilitation program Continue spiriva - 1 puff daily as your maintenance medication Continue to use albuterol MDI 2 puffs as needed for shortness of breath or wheezing Take Mucinex to assist with clearing mucus  We will refer you for lung cancer screening program

## 2016-03-23 DIAGNOSIS — H9311 Tinnitus, right ear: Secondary | ICD-10-CM | POA: Diagnosis not present

## 2016-03-23 DIAGNOSIS — J324 Chronic pansinusitis: Secondary | ICD-10-CM | POA: Diagnosis not present

## 2016-03-23 DIAGNOSIS — H903 Sensorineural hearing loss, bilateral: Secondary | ICD-10-CM | POA: Diagnosis not present

## 2016-03-30 ENCOUNTER — Other Ambulatory Visit: Payer: Self-pay | Admitting: Otolaryngology

## 2016-03-30 DIAGNOSIS — H903 Sensorineural hearing loss, bilateral: Secondary | ICD-10-CM

## 2016-03-30 DIAGNOSIS — H9311 Tinnitus, right ear: Secondary | ICD-10-CM

## 2016-04-12 ENCOUNTER — Ambulatory Visit
Admission: RE | Admit: 2016-04-12 | Discharge: 2016-04-12 | Disposition: A | Discharge status: Home / Self Care | Payer: PPO | Source: Ambulatory Visit | Attending: Otolaryngology | Admitting: Otolaryngology

## 2016-04-12 ENCOUNTER — Other Ambulatory Visit: Payer: PPO

## 2016-04-12 DIAGNOSIS — H9311 Tinnitus, right ear: Secondary | ICD-10-CM

## 2016-04-12 DIAGNOSIS — H903 Sensorineural hearing loss, bilateral: Secondary | ICD-10-CM

## 2016-04-12 MED ORDER — GADOBENATE DIMEGLUMINE 529 MG/ML IV SOLN
15.0000 mL | Freq: Once | INTRAVENOUS | Status: AC | PRN
  Administered 2016-04-12: 13 mL via INTRAVENOUS

## 2016-05-02 ENCOUNTER — Ambulatory Visit: Payer: PPO | Admitting: Adult Health

## 2016-06-06 ENCOUNTER — Ambulatory Visit (INDEPENDENT_AMBULATORY_CARE_PROVIDER_SITE_OTHER): Payer: PPO | Admitting: Adult Health

## 2016-06-06 ENCOUNTER — Encounter: Payer: Self-pay | Admitting: Adult Health

## 2016-06-06 DIAGNOSIS — F329 Major depressive disorder, single episode, unspecified: Secondary | ICD-10-CM | POA: Diagnosis not present

## 2016-06-06 DIAGNOSIS — J449 Chronic obstructive pulmonary disease, unspecified: Secondary | ICD-10-CM | POA: Diagnosis not present

## 2016-06-06 MED ORDER — TIOTROPIUM BROMIDE MONOHYDRATE 2.5 MCG/ACT IN AERS: 2.0000 | INHALATION_SPRAY | Freq: Every day | RESPIRATORY_TRACT | 0 refills | Status: DC

## 2016-06-06 MED ORDER — ALBUTEROL SULFATE HFA 108 (90 BASE) MCG/ACT IN AERS: 2.0000 | INHALATION_SPRAY | Freq: Four times a day (QID) | RESPIRATORY_TRACT | 1 refills | Status: DC | PRN

## 2016-06-06 MED ORDER — IPRATROPIUM BROMIDE HFA 17 MCG/ACT IN AERS: 2.0000 | INHALATION_SPRAY | Freq: Four times a day (QID) | RESPIRATORY_TRACT | 12 refills | Status: DC

## 2016-06-06 NOTE — Addendum Note (Signed)
Addended by: Doroteo Glassman D on: 06/06/2016 10:51 AM   Modules accepted: Orders

## 2016-06-06 NOTE — Patient Instructions (Signed)
Can change from Spriva to Atrovent Inhaler 2 puffs Four times a day .  Work on not smoking .  We can discuss pulmonary rehab in the Spring when you return.  Refer to [facility] for lung cancer screening program.  follow up Dr. Elsworth Soho  In 4 months and As needed

## 2016-06-06 NOTE — Progress Notes (Signed)
@Patient  ID: Casey Kirk, female    DOB: [birth year], 71 y.o.   MRN: [id number]  Chief Complaint  Patient presents with  . Follow-up    COPD     Referring provider: Lawerance Cruel, MD  HPI: 71 year old female smoker seen for pulmonary consult 03/16/2016 for COPD Chronic sinusitis followed by ENT Bipolar disorder and halting speech  TEST /Events  Chest x-ray 04/2015 shows hyperinflation without infiltrates or effusions Spirometry 02/2016-surprisingly shows normal ratio 72 and normal lung function FEV1 of 98%  06/06/2016 Follow up; COPD /smoker  Patient returns for a three-month follow-up COPD. Patient was recently started on Spiriva. Says it is not affordable. Her insurance will cover Atrovent inhaler. We discussed her smoking. She is trying to cut back but finds it very hard. Cessation discussed in detail with patient education. Also referred to pulmonary rehabilitation. Would like to wait to springtime. She was referred to lung cancer screening program. However, has not heard from them yet. She denies hest pain, orthopnea, PND, or increased leg swelling.    Allergies  Allergen Reactions  . Hydrocodone-Acetaminophen Itching and Swelling  . Latex Itching and Swelling    Immunization History  Administered Date(s) Administered  . Influenza Split 03/08/2016  . Influenza Whole 02/20/2008    Past Medical History:  Diagnosis Date  . Allergic rhinitis   . ARTHRITIS, HX OF 01/09/2007  . BIPOLAR II DISORDER 03/17/2008  . 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 COPD 12/21/2006  . GERD (gastroesophageal reflux disease)   . GOUT 01/09/2007  . Hx of colonic polyps   . IBS (irritable bowel syndrome)   . Osteoporosis   . Sinusitis   . Thyroid disease   . TINEA CORPORIS 10/06/2009    Tobacco History: History  Smoking Status  . Current Every Day Smoker  . Packs/day: 0.50  . Years: 40.00  . Types: Cigarettes  Smokeless Tobacco  . Never Used    Comment: Counseling sheet  to quit smoking given in exam room    Ready to quit: No Counseling given: Yes   Outpatient Encounter Prescriptions as of 06/06/2016  Medication Sig  . albuterol (PROAIR HFA) 108 (90 Base) MCG/ACT inhaler Inhale 2 puffs into the lungs every 6 (six) hours as needed for wheezing or shortness of breath.  Marland Kitchen aspirin 81 MG tablet Take 81 mg by mouth daily.    . calcium citrate-vitamin D (CITRACAL+D) 315-200 MG-UNIT per tablet Take 2 tablets by mouth daily.   . fish oil-omega-3 fatty acids 1000 MG capsule Take 1 g by mouth daily.   . fluticasone (FLONASE) 50 MCG/ACT nasal spray Place 2 sprays into the nose daily. (Patient taking differently: Place 2 sprays into the nose daily. As needed)  . Melatonin 1 MG TABS Take 2 mg by mouth at bedtime.  . Multiple Vitamin (MULTIVITAMIN) tablet Take 1 tablet by mouth daily.    . nicotine polacrilex (NICORETTE) 2 MG gum Take 2 mg by mouth as needed for smoking cessation.  Marland Kitchen tiotropium (SPIRIVA) 18 MCG inhalation capsule Place 18 mcg into inhaler and inhale daily as needed (for shortness of breath).  . traZODone (DESYREL) 50 MG tablet Take 50 mg by mouth at bedtime.  Marland Kitchen ipratropium (ATROVENT HFA) 17 MCG/ACT inhaler Inhale 2 puffs into the lungs 4 (four) times daily.  Marland Kitchen omeprazole (PRILOSEC) 40 MG capsule Take 1 capsule (40 mg total) by mouth daily. (Patient not taking: Reported on 06/06/2016)  . [DISCONTINUED] hydrochlorothiazide (HYDRODIURIL) 25 MG tablet 2 stat  then 1 qd (Patient not taking: Reported on 03/16/2016)   No facility-administered encounter medications on file as of 06/06/2016.      Review of Systems  Constitutional:   No  weight loss, night sweats,  Fevers, chills, fatigue, or  lassitude.  HEENT:   No headaches,  Difficulty swallowing,  Tooth/dental problems, or  Sore throat,                No sneezing, itching, ear ache, nasal congestion, post nasal drip,   CV:  No chest pain,  Orthopnea, PND, swelling in lower extremities, anasarca, dizziness,  palpitations, syncope.   GI  No heartburn, indigestion, abdominal pain, nausea, vomiting, diarrhea, change in bowel habits, loss of appetite, bloody stools.   Resp:   No excess mucus, no productive cough,  No non-productive cough,  No coughing up of blood.  No change in color of mucus.  No wheezing.  No chest wall deformity  Skin: no rash or lesions.  GU: no dysuria, change in color of urine, no urgency or frequency.  No flank pain, no hematuria   MS:  No joint pain or swelling.  No decreased range of motion.  No back pain.    Physical Exam  BP 136/70   Pulse 61   Temp 98.3 F (36.8 C) (Oral)   Ht 5\' 3"  (1.6 m)   Wt 141 lb 6.4 oz (64.1 kg)   SpO2 99%   BMI 25.05 kg/m   GEN: A/Ox3; pleasant , NAD, thin female    HEENT:  [place]/AT,  EACs-clear, TMs-wnl, NOSE-clear, THROAT-clear, no lesions, no postnasal drip or exudate noted.   NECK:  Supple w/ fair ROM; no JVD; normal carotid impulses w/o bruits; no thyromegaly or nodules palpated; no lymphadenopathy.    RESP  Decreased BS in bases  w/o, wheezes/ rales/ or rhonchi. no accessory muscle use, no dullness to percussion  CARD:  RRR, no m/r/g, no peripheral edema, pulses intact, no cyanosis or clubbing.  GI:   Soft & nt; nml bowel sounds; no organomegaly or masses detected.   Musco: Warm bil, no deformities or joint swelling noted.   Neuro: alert, no focal deficits noted.    Skin: Warm, no lesions or rashes  Psych:  No change in mood or affect. No depression or anxiety.  No memory loss.  Lab Results:  CBC    Component Value Date/Time   WBC 10.0 05/10/2015 1532   RBC 5.01 05/10/2015 1532   HGB 14.8 05/10/2015 1532   HCT 45.5 05/10/2015 1532   PLT 222 05/10/2015 1532   MCV 90.8 05/10/2015 1532   MCH 29.5 05/10/2015 1532   MCHC 32.5 05/10/2015 1532   RDW 13.5 05/10/2015 1532   LYMPHSABS 2.7 09/22/2010 0843   MONOABS 0.7 09/22/2010 0843   EOSABS 0.3 09/22/2010 0843   BASOSABS 0.1 09/22/2010 0843    BMET      Component Value Date/Time   NA 143 05/10/2015 1532   K 3.6 05/10/2015 1532   CL 109 05/10/2015 1532   CO2 25 05/10/2015 1532   GLUCOSE 102 (H) 05/10/2015 1532   BUN 7 05/10/2015 1532   CREATININE 0.77 05/10/2015 1532   CALCIUM 9.2 05/10/2015 1532   GFRNONAA >60 05/10/2015 1532   GFRAA >60 05/10/2015 1532    BNP No results found for: BNP  ProBNP No results found for: PROBNP  Imaging: No results found.   Assessment & Plan:   COPD (chronic obstructive pulmonary disease) ([place]) Minimal COPD in active smoker  Smoking cessation is key  Can change spiriva to atrovent  pulm rehab and lung cancer screening   Plan  Patient Instructions  Can change from Spriva to Atrovent Inhaler 2 puffs Four times a day .  Work on not smoking .  We can discuss pulmonary rehab in the Spring when you return.  Refer to [facility] for lung cancer screening program.  follow up Dr. Elsworth Soho  In 4 months and As needed          Rexene Edison, NP 06/06/2016

## 2016-06-06 NOTE — Assessment & Plan Note (Signed)
Minimal COPD in active smoker  Smoking cessation is key  Can change spiriva to atrovent  pulm rehab and lung cancer screening   Plan  Patient Instructions  Can change from Spriva to Atrovent Inhaler 2 puffs Four times a day .  Work on not smoking .  We can discuss pulmonary rehab in the Spring when you return.  Refer to [facility] for lung cancer screening program.  follow up Dr. Elsworth Soho  In 4 months and As needed

## 2016-06-11 NOTE — Progress Notes (Signed)
Reviewed & agree with plan  

## 2016-06-14 DIAGNOSIS — H9311 Tinnitus, right ear: Secondary | ICD-10-CM | POA: Diagnosis not present

## 2016-06-14 DIAGNOSIS — J449 Chronic obstructive pulmonary disease, unspecified: Secondary | ICD-10-CM | POA: Diagnosis not present

## 2016-06-14 DIAGNOSIS — Z885 Allergy status to narcotic agent status: Secondary | ICD-10-CM | POA: Diagnosis not present

## 2016-06-14 DIAGNOSIS — F17200 Nicotine dependence, unspecified, uncomplicated: Secondary | ICD-10-CM | POA: Diagnosis not present

## 2016-06-14 DIAGNOSIS — M26609 Unspecified temporomandibular joint disorder, unspecified side: Secondary | ICD-10-CM | POA: Diagnosis not present

## 2016-06-14 DIAGNOSIS — Z886 Allergy status to analgesic agent status: Secondary | ICD-10-CM | POA: Diagnosis not present

## 2016-06-14 DIAGNOSIS — H903 Sensorineural hearing loss, bilateral: Secondary | ICD-10-CM | POA: Diagnosis not present

## 2016-06-22 ENCOUNTER — Other Ambulatory Visit: Payer: Self-pay | Admitting: Acute Care

## 2016-06-22 DIAGNOSIS — F17210 Nicotine dependence, cigarettes, uncomplicated: Principal | ICD-10-CM

## 2016-06-30 ENCOUNTER — Telehealth: Payer: Self-pay | Admitting: Acute Care

## 2016-06-30 NOTE — Telephone Encounter (Signed)
Spoke with pt. She is aware that the CT that she is scheduled for is a LDCT scan. Also advised her these CT's are precerted with insurance before they are scheduled. Pt was at ease about her scan before the conversation was ended. Nothing further was needed.

## 2016-07-05 ENCOUNTER — Ambulatory Visit (INDEPENDENT_AMBULATORY_CARE_PROVIDER_SITE_OTHER)
Admission: RE | Admit: 2016-07-05 | Discharge: 2016-07-05 | Disposition: A | Discharge status: Home / Self Care | Payer: PPO | Source: Ambulatory Visit | Attending: Acute Care | Admitting: Acute Care

## 2016-07-05 ENCOUNTER — Encounter: Payer: Self-pay | Admitting: Acute Care

## 2016-07-05 ENCOUNTER — Ambulatory Visit (INDEPENDENT_AMBULATORY_CARE_PROVIDER_SITE_OTHER): Payer: PPO | Admitting: Acute Care

## 2016-07-05 DIAGNOSIS — F17210 Nicotine dependence, cigarettes, uncomplicated: Secondary | ICD-10-CM

## 2016-07-05 DIAGNOSIS — Z87891 Personal history of nicotine dependence: Secondary | ICD-10-CM | POA: Diagnosis not present

## 2016-07-05 NOTE — Progress Notes (Signed)
Shared Decision Making Visit Lung Cancer Screening Program [phone])   Eligibility:  Age 71 y.o.  Pack Years Smoking History Calculation 32.5 pack years smoking history (# packs/per year x # years smoked)  Recent History of coughing up blood no  Unexplained weight loss? no ( >Than 15 pounds within the last 6 months )  Prior History Lung / other cancer no (Diagnosis within the last 5 years already requiring surveillance chest CT Scans).  Smoking Status Current Smoker  Former Smokers: Years since quit: NA  Quit Date: NA  Visit Components:  Discussion included one or more decision making aids. yes  Discussion included risk/benefits of screening. yes  Discussion included potential follow up diagnostic testing for abnormal scans. yes  Discussion included meaning and risk of over diagnosis. yes  Discussion included meaning and risk of False Positives. yes  Discussion included meaning of total radiation exposure. yes  Counseling Included:  Importance of adherence to annual lung cancer LDCT screening. yes  Impact of comorbidities on ability to participate in the program. yes  Ability and willingness to under diagnostic treatment. yes  Smoking Cessation Counseling:  Current Smokers:   Discussed importance of smoking cessation. yes  Information about tobacco cessation classes and interventions provided to patient. yes  Patient provided with "ticket" for LDCT Scan. yes  Symptomatic Patient. no  CounselingNA   Diagnosis Code: Tobacco Use Z72.0  Asymptomatic Patient yes  Counseling (Intermediate counseling: > three minutes counseling) [id number]  Former Smokers:   Discussed the importance of maintaining cigarette abstinence. yes  Diagnosis Code: Personal History of Nicotine Dependence. [id number]  Information about tobacco cessation classes and interventions provided to patient. Yes  Patient provided with "ticket" for LDCT Scan. yes  Written Order for Lung Cancer  Screening with LDCT placed in Epic. Yes (CT Chest Lung Cancer Screening Low Dose W/O CM) [id number] Z12.2-Screening of respiratory organs Z87.891-Personal history of nicotine dependence  I spent 4 minutes counseling on smoking cessation during this shared decision making visit.  I have spent 25 minutes of face to face time with Casey Kirk discussing the risks and benefits of lung cancer screening. We viewed a power point together that explained in detail the above noted topics. We paused at intervals to allow for questions to be asked and answered to ensure understanding.We discussed that the single most powerful action that she can take to decrease her risk of developing lung cancer is to quit smoking. We discussed whether or not she is ready to commit to setting a quit date. She is currently not ready to set a quit date. We discussed options for tools to aid in quitting smoking including nicotine replacement therapy, non-nicotine medications, support groups, Quit Smart classes, and behavior modification. We discussed that often times setting smaller, more achievable goals, such as eliminating 1 cigarette a day for a week and then 2 cigarettes a day for a week can be helpful in slowly decreasing the number of cigarettes smoked. This allows for a sense of accomplishment as well as providing a clinical benefit. I gave her  the " Be Stronger Than Your Excuses" card with contact information for community resources, classes, free nicotine replacement therapy, and access to mobile apps, text messaging, and on-line smoking cessation help. I have also given her my card and contact information in the event she needs to contact me. We discussed the time and location of the scan, and that either [name], CMA, or I will call with the results within 24-48 hours of  receiving them. I have provided her with a copy of the power point we viewed  as a resource in the event they need reinforcement of the concepts we discussed  today in the office. The patient verbalized understanding of all of  the above and had no further questions upon leaving the office. They have my contact information in the event they have any further questions.  We discussed the high incidence of coronary artery disease noted on the scan. We discussed the fact that this is a non-gated exam and therefore degree and severity cannot be determined. I told Casey Kirk that I will fax a copy of this result to her primary care physician so that he can follow-up as he feels is clinically indicated in the event we do note coronary artery disease on her scan. She does state that she had a brother who died of a heart attack at 77 years old. He is currently not on statin therapy but does have her cholesterol and triglycerides checked annually by her PCP.  [name], NP 07/05/2016

## 2016-07-10 ENCOUNTER — Telehealth: Payer: Self-pay | Admitting: Acute Care

## 2016-07-10 DIAGNOSIS — F17210 Nicotine dependence, cigarettes, uncomplicated: Secondary | ICD-10-CM

## 2016-07-10 NOTE — Telephone Encounter (Signed)
Pt is requesting lung cancer screening CT results. Message will be routed to the lung nodule pool.

## 2016-07-11 NOTE — Telephone Encounter (Signed)
I have called Casey Kirk with the results of her low dose CT. I explained her scan was read as a  Lung  RADS 3, nodules that are probably benign findings, short term follow up suggested: includes nodules with a low likelihood of becoming a clinically active cancer. Radiology recommends a 6 month repeat LDCT follow up.I Explained that we would order and schedule the scan for August 2018. We discussed that she has had bronchitis recently. I have encouraged her to call her primary care physician to discuss possible appointment in treatment. I will fax a copy of this to her PCP. She verbalized understanding of the above and had no further questions.

## 2016-07-11 NOTE — Telephone Encounter (Signed)
Sarah, please advise on CT results.

## 2016-07-13 DIAGNOSIS — R938 Abnormal findings on diagnostic imaging of other specified body structures: Secondary | ICD-10-CM | POA: Diagnosis not present

## 2016-07-13 DIAGNOSIS — R05 Cough: Secondary | ICD-10-CM | POA: Diagnosis not present

## 2016-07-18 DIAGNOSIS — H524 Presbyopia: Secondary | ICD-10-CM | POA: Diagnosis not present

## 2016-07-18 DIAGNOSIS — H2513 Age-related nuclear cataract, bilateral: Secondary | ICD-10-CM | POA: Diagnosis not present

## 2016-07-18 DIAGNOSIS — H43813 Vitreous degeneration, bilateral: Secondary | ICD-10-CM | POA: Diagnosis not present

## 2016-07-18 DIAGNOSIS — H25011 Cortical age-related cataract, right eye: Secondary | ICD-10-CM | POA: Diagnosis not present

## 2016-07-25 ENCOUNTER — Other Ambulatory Visit: Payer: Self-pay

## 2016-07-25 NOTE — Telephone Encounter (Signed)
NOTES SENT TO SCHEDULING.  °

## 2016-07-27 DIAGNOSIS — J329 Chronic sinusitis, unspecified: Secondary | ICD-10-CM | POA: Diagnosis not present

## 2016-08-08 ENCOUNTER — Encounter: Payer: Self-pay | Admitting: Cardiology

## 2016-08-17 ENCOUNTER — Encounter: Payer: Self-pay | Admitting: Cardiology

## 2016-08-17 ENCOUNTER — Ambulatory Visit (INDEPENDENT_AMBULATORY_CARE_PROVIDER_SITE_OTHER): Payer: PPO | Admitting: Cardiology

## 2016-08-17 ENCOUNTER — Encounter (INDEPENDENT_AMBULATORY_CARE_PROVIDER_SITE_OTHER): Payer: Self-pay

## 2016-08-17 VITALS — BP 130/70 | HR 84 | Ht 63.0 in | Wt 142.2 lb

## 2016-08-17 DIAGNOSIS — I2510 Atherosclerotic heart disease of native coronary artery without angina pectoris: Secondary | ICD-10-CM

## 2016-08-17 DIAGNOSIS — Z8249 Family history of ischemic heart disease and other diseases of the circulatory system: Secondary | ICD-10-CM

## 2016-08-17 DIAGNOSIS — J438 Other emphysema: Secondary | ICD-10-CM

## 2016-08-17 DIAGNOSIS — I700 Atherosclerosis of aorta: Secondary | ICD-10-CM

## 2016-08-17 DIAGNOSIS — I2584 Coronary atherosclerosis due to calcified coronary lesion: Secondary | ICD-10-CM

## 2016-08-17 DIAGNOSIS — R0789 Other chest pain: Secondary | ICD-10-CM

## 2016-08-17 HISTORY — DX: Atherosclerotic heart disease of native coronary artery without angina pectoris: I25.10

## 2016-08-17 HISTORY — DX: Atherosclerosis of aorta: I70.0

## 2016-08-17 HISTORY — DX: Family history of ischemic heart disease and other diseases of the circulatory system: Z82.49

## 2016-08-17 NOTE — Progress Notes (Signed)
Cardiology Office Note    Date:  08/17/2016   ID:  Casey, Kirk March 12, 1946, MRN [id number]  PCP:  Casey Crutch, MD  Cardiologist:   Casey Furbish, MD   Chief Complaint  Patient presents with  . New Patient (Initial Visit)    abnormal CT of the chest    History of Present Illness:  Casey Kirk is a 71 y.o. female here for the evaluation of abnormal CT scan on the chest showing coronary calcification.  She is a long-standing smoker with COPD.  She has experienced atypical chest pain which is precipitated by pushing on her left chest wall. Mild to moderate in severity. She also experiences some gastric reflux.  She does not describe any exertional chest discomfort. Shortness of breath noted.  Brother MI 107.   PAD Screen 08/17/2016  Previous PAD dx? Yes  Previous surgical procedure? No  Pain with walking? Yes  Subsides with rest? Yes  Feet/toe relief with dangling? No  Painful, non-healing ulcers? No  Extremities discolored? No   Denies any syncope, bleeding, orthopnea, PND.  I have taking care of her husband before.    Past Medical History:  Diagnosis Date  . Allergic rhinitis   . ARTHRITIS, HX OF 01/09/2007  . BIPOLAR II DISORDER 03/17/2008  . 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 COPD 12/21/2006  . GERD (gastroesophageal reflux disease)   . GOUT 01/09/2007  . Hx of colonic polyps   . IBS (irritable bowel syndrome)   . Osteoporosis   . Sinusitis   . Thyroid disease   . TINEA CORPORIS 10/06/2009    Past Surgical History:  Procedure Laterality Date  . ABDOMINAL HYSTERECTOMY     CA cells removed end of vagina  . BREAST SURGERY     biopsy  . vaginal carcinoma surgery      Current Medications: Outpatient Medications Prior to Visit  Medication Sig Dispense Refill  . aspirin 81 MG tablet Take 81 mg by mouth daily.      . calcium citrate-vitamin D (CITRACAL+D) 315-200 MG-UNIT per tablet Take 2 tablets by mouth daily.     . fish oil-omega-3 fatty acids  1000 MG capsule Take 1 g by mouth daily.     . Melatonin 1 MG TABS Take 2 mg by mouth at bedtime.    . Multiple Vitamin (MULTIVITAMIN) tablet Take 1 tablet by mouth daily.      . nicotine polacrilex (NICORETTE) 2 MG gum Take 2 mg by mouth as needed for smoking cessation.    . traZODone (DESYREL) 50 MG tablet Take 50 mg by mouth at bedtime.    Marland Kitchen albuterol (PROAIR HFA) 108 (90 Base) MCG/ACT inhaler Inhale 2 puffs into the lungs every 6 (six) hours as needed for wheezing or shortness of breath. (Patient not taking: Reported on 08/17/2016) 18 g 1  . fluticasone (FLONASE) 50 MCG/ACT nasal spray Place 2 sprays into the nose daily. (Patient taking differently: Place 2 sprays into the nose daily. As needed) 1 g 6  . ipratropium (ATROVENT HFA) 17 MCG/ACT inhaler Inhale 2 puffs into the lungs 4 (four) times daily. (Patient not taking: Reported on 08/17/2016) 1 Inhaler 12  . omeprazole (PRILOSEC) 40 MG capsule Take 1 capsule (40 mg total) by mouth daily. (Patient not taking: Reported on 06/06/2016) 30 capsule 6  . tiotropium (SPIRIVA) 18 MCG inhalation capsule Place 18 mcg into inhaler and inhale daily as needed (for shortness of breath).    . Tiotropium Bromide  Monohydrate (SPIRIVA RESPIMAT) 2.5 MCG/ACT AERS Inhale 2 puffs into the lungs daily. (Patient not taking: Reported on 08/17/2016) 4 g 0   No facility-administered medications prior to visit.      Allergies:   Hydrocodone-acetaminophen; Latex; and Pneumococcal polysaccharide vaccine   Social History   Social History  . Marital status: Married    Spouse name: N/A  . Number of children: 2  . Years of education: N/A   Occupational History  . retired    Social History Main Topics  . Smoking status: Current Every Day Smoker    Packs/day: 1.00    Years: 40.00    Types: Cigarettes  . Smokeless tobacco: Never Used     Comment: Counseling sheet to quit smoking given in exam room   . Alcohol use Yes     Comment: Rarely   . Drug use: No  . Sexual  activity: Yes   Other Topics Concern  . None   Social History Narrative   Lives alone.  She has two grown children.   She is retired from working at [employer].   Highest level of education:  2 year business college     Family History:  The patient's family history includes Alcohol abuse in her other; Arthritis in her other; COPD in her mother; Cancer in her other; Colon polyps in her brother and mother; Diabetes in her other; Heart attack (age of onset: 45) in her brother; Heart disease in her other; Hyperlipidemia in her other; Hypertension in her other; Sudden death in her other; Tremor in her mother.   ROS:   Please see the history of present illness.    ROS All other systems reviewed and are negative.   PHYSICAL EXAM:   VS:  BP 130/70   Pulse 84   Ht 5\' 3"  (1.6 m)   Wt 142 lb 3.2 oz (64.5 kg)   BMI 25.19 kg/m    GEN: Well nourished, well developed, in no acute distress  HEENT: normal  Neck: no JVD, carotid bruits, or masses Cardiac: RRR; no murmurs, rubs, or gallops,no edema  Respiratory:  Decreased breath sounds bilaterally, normal respiratory effort GI: soft, nontender, nondistended, + BS MS: no deformity or atrophy  Skin: warm and dry, no rash, has had redness on her palms for quite some time. Neuro:  Alert and Oriented x 3, Strength and sensation are intact Psych: euthymic mood, full affect  Wt Readings from Last 3 Encounters:  08/17/16 142 lb 3.2 oz (64.5 kg)  06/06/16 141 lb 6.4 oz (64.1 kg)  03/16/16 142 lb 6.4 oz (64.6 kg)      Studies/Labs Reviewed:   EKG:  EKG is ordered today.  The ekg ordered today demonstrates 08/17/16-sinus rhythm 84 with no other abnormalities personally viewed.  Recent Labs: No results found for requested labs within last 8760 hours.   Lipid Panel    Component Value Date/Time   CHOL 182 09/22/2010 0843   TRIG 103.0 09/22/2010 0843   HDL 36.80 (L) 09/22/2010 0843   CHOLHDL 5 09/22/2010 0843   VLDL 20.6 09/22/2010 0843   LDLCALC  125 (H) 09/22/2010 0843    Additional studies/ records that were reviewed today include:  CT of chest, EKG, lab work, prior office notes reviewed. Personally reviewed CT scan with patient.    ASSESSMENT:    1. Atypical chest pain   2. Coronary artery calcification   3. Aortic atherosclerosis ([place])   4. Other emphysema ([place])   5. Family history of  early CAD      PLAN:  In order of problems listed above:  Coronary artery calcification  - With her right coronary artery calcification and atypical chest pain we will order a nuclear stress test. Try to detect any evidence of ischemia.   Aortic atherosclerosis  - Strongly urged smoking cessation. Could consider statin therapy given evidence of atherosclerosis. Blood pressure under good control.  COPD/tobacco use  - We discussed tobacco cessation today and the importance for reduction of cardiovascular illness.  Strong family history of CAD  - Brother with MI age 57. He was also a smoker.    Medication Adjustments/Labs and Tests Ordered: Current medicines are reviewed at length with the patient today.  Concerns regarding medicines are outlined above.  Medication changes, Labs and Tests ordered today are listed in the Patient Instructions below. Patient Instructions  Medication Instructions:  The current medical regimen is effective;  continue present plan and medications.  Testing/Procedures: Your physician has requested that you have a myoview. For further information please visit [email or web address]. Please follow instruction sheet, as given.  Follow-Up: Follow up as needed after testing.  If you need a refill on your cardiac medications before your next appointment, please call your pharmacy.  Thank you for choosing [facility]!!        Signed, Casey Furbish, MD  08/17/2016 10:55 AM    [address], [place], [place]  [address] Phone: [phone]; Fax: [phone]

## 2016-08-17 NOTE — Patient Instructions (Signed)
Medication Instructions:  The current medical regimen is effective;  continue present plan and medications.  Testing/Procedures: Your physician has requested that you have a myoview. For further information please visit [email or web address]. Please follow instruction sheet, as given.  Follow-Up: Follow up as needed after testing.  If you need a refill on your cardiac medications before your next appointment, please call your pharmacy.  Thank you for choosing [facility]!!

## 2016-08-22 ENCOUNTER — Telehealth (HOSPITAL_COMMUNITY): Payer: Self-pay | Admitting: *Deleted

## 2016-08-22 NOTE — Telephone Encounter (Signed)
Left message on voicemail per DPR in reference to upcoming appointment scheduled on 08/25/16 with detailed instructions given per Myocardial Perfusion Study Information Sheet for the test. LM to arrive 15 minutes early, and that it is imperative to arrive on time for appointment to keep from having the test rescheduled. If you need to cancel or reschedule your appointment, please call the office within 24 hours of your appointment. Failure to do so may result in a cancellation of your appointment, and a $50 no show fee. Phone number given for call back for any questions. Kirstie Peri

## 2016-08-23 ENCOUNTER — Encounter (HOSPITAL_COMMUNITY): Payer: PPO

## 2016-08-25 ENCOUNTER — Ambulatory Visit (HOSPITAL_COMMUNITY): Payer: PPO | Attending: Internal Medicine

## 2016-08-25 DIAGNOSIS — F17200 Nicotine dependence, unspecified, uncomplicated: Secondary | ICD-10-CM | POA: Insufficient documentation

## 2016-08-25 DIAGNOSIS — I2510 Atherosclerotic heart disease of native coronary artery without angina pectoris: Secondary | ICD-10-CM | POA: Diagnosis not present

## 2016-08-25 DIAGNOSIS — R0789 Other chest pain: Secondary | ICD-10-CM | POA: Insufficient documentation

## 2016-08-25 DIAGNOSIS — Z8249 Family history of ischemic heart disease and other diseases of the circulatory system: Secondary | ICD-10-CM | POA: Diagnosis not present

## 2016-08-25 DIAGNOSIS — I2584 Coronary atherosclerosis due to calcified coronary lesion: Secondary | ICD-10-CM | POA: Insufficient documentation

## 2016-08-25 DIAGNOSIS — J449 Chronic obstructive pulmonary disease, unspecified: Secondary | ICD-10-CM | POA: Insufficient documentation

## 2016-08-25 LAB — MYOCARDIAL PERFUSION IMAGING
Estimated workload: 7 METS
Exercise duration (min): 6 min
Exercise duration (sec): 30 s
LV dias vol: 70 mL (ref 46–106)
LV sys vol: 20 mL
MPHR: 152 {beats}/min
Peak HR: 131 {beats}/min
Percent HR: 86 %
RATE: 0.36
Rest HR: 60 {beats}/min
SDS: 6
SRS: 3
SSS: 9
TID: 0.95

## 2016-08-25 MED ORDER — TECHNETIUM TC 99M TETROFOSMIN IV KIT
32.8000 | PACK | Freq: Once | INTRAVENOUS | Status: AC | PRN
  Administered 2016-08-25: 32.8 via INTRAVENOUS
  Filled 2016-08-25: qty 33

## 2016-08-25 MED ORDER — TECHNETIUM TC 99M TETROFOSMIN IV KIT
10.4000 | PACK | Freq: Once | INTRAVENOUS | Status: AC | PRN
  Administered 2016-08-25: 10.4 via INTRAVENOUS
  Filled 2016-08-25: qty 11

## 2016-09-19 DIAGNOSIS — R451 Restlessness and agitation: Secondary | ICD-10-CM | POA: Diagnosis not present

## 2016-09-19 DIAGNOSIS — E782 Mixed hyperlipidemia: Secondary | ICD-10-CM | POA: Diagnosis not present

## 2016-10-03 ENCOUNTER — Encounter: Payer: Self-pay | Admitting: Pulmonary Disease

## 2016-10-03 ENCOUNTER — Ambulatory Visit (INDEPENDENT_AMBULATORY_CARE_PROVIDER_SITE_OTHER): Payer: PPO | Admitting: Pulmonary Disease

## 2016-10-03 VITALS — BP 112/70 | HR 69 | Ht 63.0 in | Wt 141.0 lb

## 2016-10-03 DIAGNOSIS — J432 Centrilobular emphysema: Secondary | ICD-10-CM | POA: Diagnosis not present

## 2016-10-03 MED ORDER — ALBUTEROL SULFATE HFA 108 (90 BASE) MCG/ACT IN AERS: 2.0000 | INHALATION_SPRAY | Freq: Four times a day (QID) | RESPIRATORY_TRACT | 3 refills | Status: DC | PRN

## 2016-10-03 MED ORDER — NICOTINE 10 MG IN INHA
1.0000 | RESPIRATORY_TRACT | 5 refills | Status: DC | PRN
Start: 2016-10-03 — End: 2017-03-12

## 2016-10-03 NOTE — Assessment & Plan Note (Signed)
Prescription for -Nicotine inhaler #5 , call for more if needed.  We once again had a good conversation about smoking cessation. She is planning to go on a long cruise to [place] and will be forced to quit for at least 7 days

## 2016-10-03 NOTE — Assessment & Plan Note (Signed)
-  Pro- Air MDI 2 puffs every 6 hours as needed x 3 refills  Lung function is preserved and she can stop taking Spiriva

## 2016-10-03 NOTE — Patient Instructions (Signed)
Prescription for -Nicotine inhaler #5 , call for more if needed.  -Pro- Air MDI 2 puffs every 6 hours as needed x 3 refills

## 2016-10-03 NOTE — Progress Notes (Signed)
   Subjective:    Patient ID: Casey Kirk, female    DOB: 01-11-1946, 71 y.o.   MRN: [id number]  HPI  71 year old smoker for FU of COPD She smoked -about 40 pack years She has bipolar disorder and halting speech   She has recurrent sinus infection and has seen ENT.  She continues to smoke half-pack per day. Nicotine patch has not worked for her. She is planning to go on a cruise to [place] in June and is anxious about not being able to smoke. We reviewed her screening CT chest and recommendations. She has a chronic cough productive of clear sputum occasionally brown, no wheezing.  She has stopped taking Spiriva and is now only taking albuterol on an as-needed basis  She has a brother who died of CAD at age 38 and after screening CT chest she underwent a cardiac stress imaging which was low risk   Significant tests/ events reviewed   Spirometry 02/2016- normal ratio 72 and normal lung function FEV1 of 98%  CT chest screen 06/2016 RADS 3- Patchy ground-glass in the lower lobes , mild emphysema, 49mm LLL nodule   Review of Systems Patient denies significant dyspnea,cough, hemoptysis,  chest pain, palpitations, pedal edema, orthopnea, paroxysmal nocturnal dyspnea, lightheadedness, nausea, vomiting, abdominal or  leg pains      Objective:   Physical Exam   Gen. Pleasant, well-nourished, in no distress ENT - no thrush, no post nasal drip Neck: No JVD, no thyromegaly, no carotid bruits Lungs: no use of accessory muscles, no dullness to percussion, clear without rales or rhonchi  Cardiovascular: Rhythm regular, heart sounds  normal, no murmurs or gallops, no peripheral edema Musculoskeletal: No deformities, no cyanosis or clubbing       Assessment & Plan:

## 2016-11-06 DIAGNOSIS — Z9189 Other specified personal risk factors, not elsewhere classified: Secondary | ICD-10-CM | POA: Diagnosis not present

## 2016-11-06 DIAGNOSIS — Z7251 High risk heterosexual behavior: Secondary | ICD-10-CM | POA: Diagnosis not present

## 2016-11-06 DIAGNOSIS — N842 Polyp of vagina: Secondary | ICD-10-CM | POA: Diagnosis not present

## 2016-11-26 IMAGING — CR DG CHEST 2V
2 series · 2 of 2 positions shown · non-contrast
Comparison: 10/15/2014

CLINICAL DATA: Chest pain for several days, history of COPD

EXAM:
CHEST  2 VIEW

[chest pa]
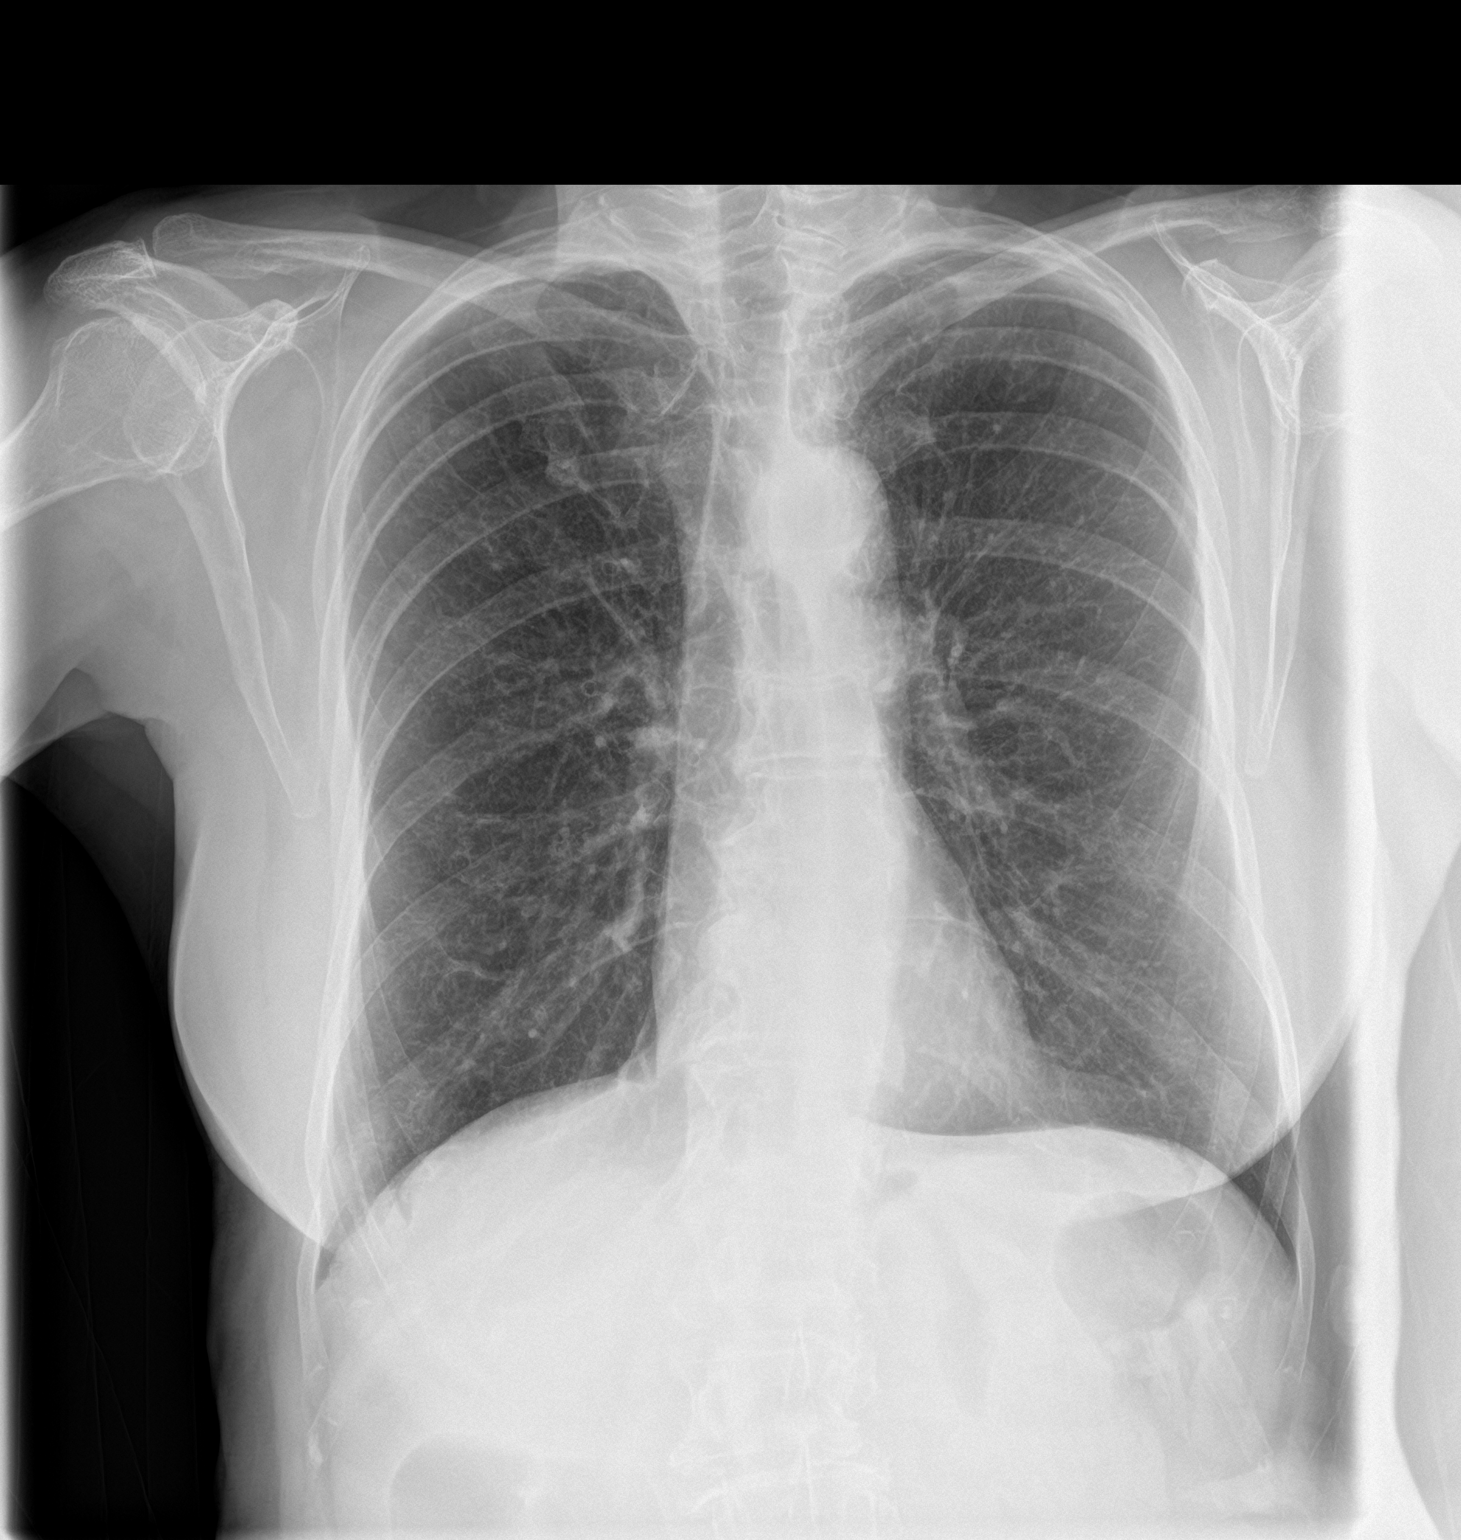

[chest lat]
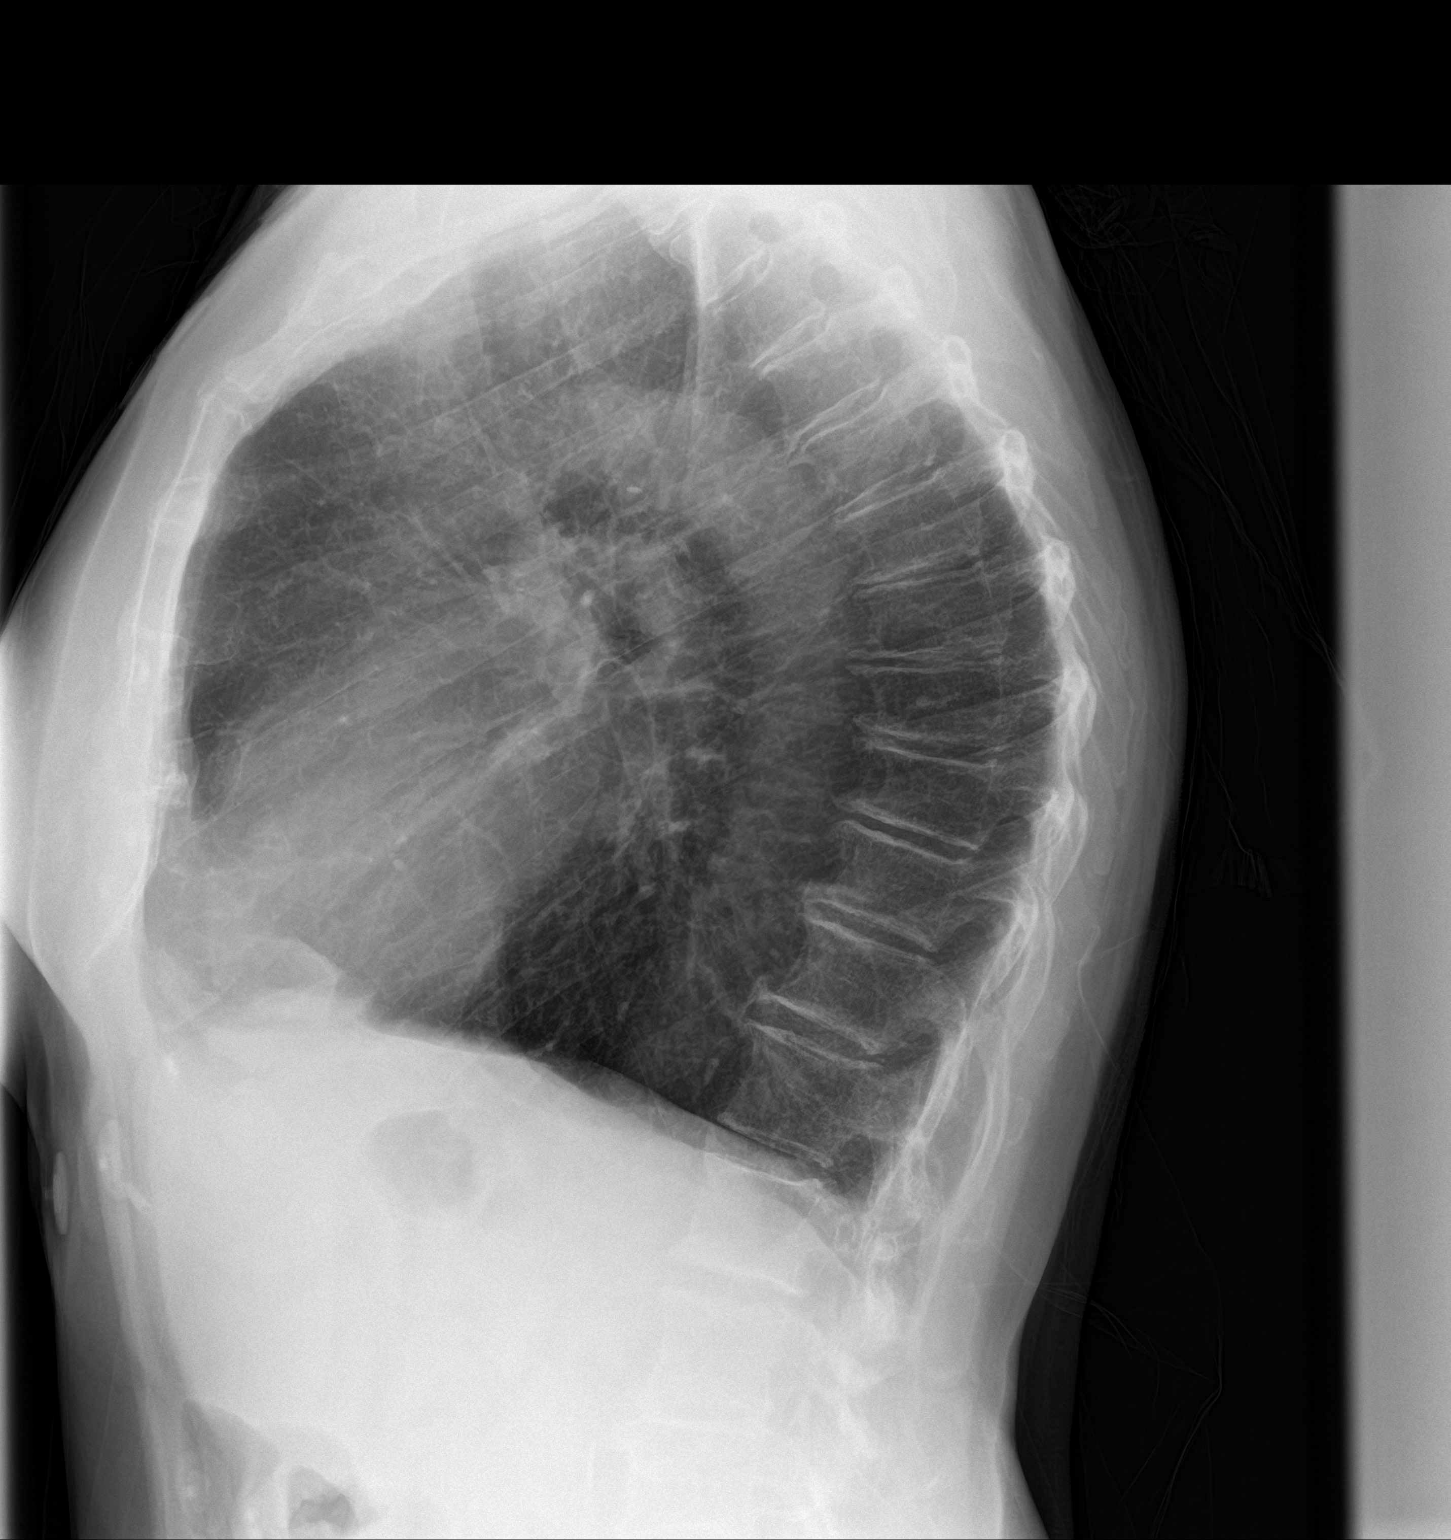

[2 of 2 positions shown; findings below may reference images not displayed]

FINDINGS: Heart size and vascular pattern are normal. No consolidation or
effusion. Stable hyperinflation.
IMPRESSION: COPD with no acute finding

## 2016-11-28 DIAGNOSIS — F329 Major depressive disorder, single episode, unspecified: Secondary | ICD-10-CM | POA: Diagnosis not present

## 2016-12-07 DIAGNOSIS — Z1231 Encounter for screening mammogram for malignant neoplasm of breast: Secondary | ICD-10-CM | POA: Diagnosis not present

## 2017-01-09 ENCOUNTER — Ambulatory Visit: Payer: PPO | Admitting: Adult Health

## 2017-01-10 ENCOUNTER — Ambulatory Visit (INDEPENDENT_AMBULATORY_CARE_PROVIDER_SITE_OTHER)
Admission: RE | Admit: 2017-01-10 | Discharge: 2017-01-10 | Disposition: A | Discharge status: Home / Self Care | Payer: PPO | Source: Ambulatory Visit | Attending: Acute Care | Admitting: Acute Care

## 2017-01-10 DIAGNOSIS — R918 Other nonspecific abnormal finding of lung field: Secondary | ICD-10-CM | POA: Diagnosis not present

## 2017-01-10 DIAGNOSIS — F17210 Nicotine dependence, cigarettes, uncomplicated: Secondary | ICD-10-CM

## 2017-01-16 ENCOUNTER — Other Ambulatory Visit: Payer: Self-pay | Admitting: Acute Care

## 2017-01-16 DIAGNOSIS — Z122 Encounter for screening for malignant neoplasm of respiratory organs: Secondary | ICD-10-CM

## 2017-01-16 DIAGNOSIS — F17210 Nicotine dependence, cigarettes, uncomplicated: Principal | ICD-10-CM

## 2017-01-31 ENCOUNTER — Ambulatory Visit (INDEPENDENT_AMBULATORY_CARE_PROVIDER_SITE_OTHER): Payer: PPO | Admitting: Adult Health

## 2017-01-31 ENCOUNTER — Encounter: Payer: Self-pay | Admitting: Adult Health

## 2017-01-31 DIAGNOSIS — Z720 Tobacco use: Secondary | ICD-10-CM

## 2017-01-31 DIAGNOSIS — Z23 Encounter for immunization: Secondary | ICD-10-CM | POA: Diagnosis not present

## 2017-01-31 DIAGNOSIS — J449 Chronic obstructive pulmonary disease, unspecified: Secondary | ICD-10-CM | POA: Diagnosis not present

## 2017-01-31 NOTE — Assessment & Plan Note (Signed)
Smoking cessation  

## 2017-01-31 NOTE — Patient Instructions (Signed)
Continue on Albuterol As needed   Work on not smoking .  Mucinex DM Twice daily  As needed  Cough/congestion  Follow up with Dr. Elsworth Soho  In 1 year and As needed   Flu shot today .

## 2017-01-31 NOTE — Progress Notes (Signed)
@Patient  ID: Casey Kirk, female    DOB: 1945-08-17, 71 y.o.   MRN: [id number]  Chief Complaint  Patient presents with  . Follow-up    COPD     Referring provider: Lawerance Cruel, MD  HPI: 71 year old female smoker seen for pulmonary consult 03/16/2016 for COPD Chronic sinusitis followed by ENT Bipolar disorder and halting speech  TEST /Events  Chest x-ray 04/2015 shows hyperinflation without infiltrates or effusions Spirometry 02/2016-surprisingly shows normal ratio 72 and normal lung function FEV1 of 98%  01/31/2017 Follow up ; COPD /Smoker  Patient returns for a four-month follow-up. Patient says that she is doing okay with her COPD. She uses albuterol as needed. No recent use of her albuterol.. On Spiriva but was not affordable. She denies any flare cough or wheezing. Would like flu shot today .  Discussed smoking cessation .  She has LDCT screening in August that showed stable nodules , repeat in 1 yr.    Allergies  Allergen Reactions  . Hydrocodone-Acetaminophen Itching and Swelling  . Latex Itching and Swelling  . Pneumococcal Polysaccharide Vaccine Other (See Comments)    other    Immunization History  Administered Date(s) Administered  . Influenza Split 03/08/2016  . Influenza Whole 02/20/2008    Past Medical History:  Diagnosis Date  . Allergic rhinitis   . ARTHRITIS, HX OF 01/09/2007  . BIPOLAR II DISORDER 03/17/2008  . 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 COPD 12/21/2006  . GERD (gastroesophageal reflux disease)   . GOUT 01/09/2007  . Hx of colonic polyps   . IBS (irritable bowel syndrome)   . Osteoporosis   . Sinusitis   . Thyroid disease   . TINEA CORPORIS 10/06/2009    Tobacco History: History  Smoking Status  . Current Every Day Smoker  . Packs/day: 1.00  . Years: 40.00  . Types: Cigarettes  Smokeless Tobacco  . Never Used    Comment: Pt states that she is currently smoking about 5cigarettes a day now.   Ready to quit:  Yes Counseling given: Yes   Outpatient Encounter Prescriptions as of 01/31/2017  Medication Sig  . aspirin 81 MG tablet Take 81 mg by mouth daily.    . calcium citrate-vitamin D (CITRACAL+D) 315-200 MG-UNIT per tablet Take 2 tablets by mouth daily.   . fish oil-omega-3 fatty acids 1000 MG capsule Take 1 g by mouth daily.   . fluticasone (FLONASE) 50 MCG/ACT nasal spray Place 2 sprays into both nostrils daily as needed for allergies or rhinitis.  . Melatonin 1 MG TABS Take 2 mg by mouth at bedtime.  . Multiple Vitamin (MULTIVITAMIN) tablet Take 1 tablet by mouth daily.    . nicotine (NICOTROL) 10 MG inhaler Inhale 1 Cartridge (1 continuous puffing total) into the lungs as needed for smoking cessation.  . nicotine polacrilex (NICORETTE) 2 MG gum Take 2 mg by mouth as needed for smoking cessation.  Marland Kitchen omeprazole (PRILOSEC) 40 MG capsule Take 40 mg by mouth daily as needed. indigestion  . simvastatin (ZOCOR) 10 MG tablet Take 10 mg by mouth daily.  . traZODone (DESYREL) 50 MG tablet Take 50 mg by mouth at bedtime.  . vitamin E 100 UNIT capsule Take by mouth daily.  Marland Kitchen albuterol (PROVENTIL HFA;VENTOLIN HFA) 108 (90 Base) MCG/ACT inhaler Inhale 2 puffs into the lungs every 6 (six) hours as needed for wheezing or shortness of breath.  . [DISCONTINUED] albuterol (PROVENTIL HFA;VENTOLIN HFA) 108 (90 Base) MCG/ACT inhaler Inhale 2 puffs  into the lungs every 6 (six) hours as needed for wheezing or shortness of breath.   No facility-administered encounter medications on file as of 01/31/2017.      Review of Systems  Constitutional:   No  weight loss, night sweats,  Fevers, chills, fatigue, or  lassitude.  HEENT:   No headaches,  Difficulty swallowing,  Tooth/dental problems, or  Sore throat,                No sneezing, itching, ear ache,  +nasal congestion, post nasal drip,   CV:  No chest pain,  Orthopnea, PND, swelling in lower extremities, anasarca, dizziness, palpitations, syncope.   GI  No  heartburn, indigestion, abdominal pain, nausea, vomiting, diarrhea, change in bowel habits, loss of appetite, bloody stools.   Resp:  No non-productive cough,  No coughing up of blood.  No change in color of mucus.  No wheezing.  No chest wall deformity  Skin: no rash or lesions.  GU: no dysuria, change in color of urine, no urgency or frequency.  No flank pain, no hematuria   MS:  No joint pain or swelling.  No decreased range of motion.  No back pain.    Physical Exam  BP 126/70 (BP Location: Right Arm, Cuff Size: Normal)   Pulse 64   Ht 5\' 3"  (1.6 m)   Wt 141 lb (64 kg)   SpO2 100%   BMI 24.98 kg/m   GEN: A/Ox3; pleasant , NAD, well nourished    HEENT:  [place]/AT,  EACs-clear, TMs-wnl, NOSE-clear, THROAT-clear, no lesions, no postnasal drip or exudate noted.   NECK:  Supple w/ fair ROM; no JVD; normal carotid impulses w/o bruits; no thyromegaly or nodules palpated; no lymphadenopathy.    RESP  Clear  P & A; w/o, wheezes/ rales/ or rhonchi. no accessory muscle use, no dullness to percussion  CARD:  RRR, no m/r/g, no peripheral edema, pulses intact, no cyanosis or clubbing.  GI:   Soft & nt; nml bowel sounds; no organomegaly or masses detected.   Musco: Warm bil, no deformities or joint swelling noted.   Neuro: alert, no focal deficits noted.    Skin: Warm, no lesions or rashes    Lab Results:  BMET  BNP No results found for: BNP  ProBNP No results found for: PROBNP  Imaging: Ct Chest Lcs Nodule F/u W/o Contrast  Result Date: 01/10/2017 CLINICAL DATA:  71 year old female current smoker, with 38 pack-year history of smoking, for six-month follow-up lung cancer screening EXAM: CT CHEST WITHOUT CONTRAST FOR LUNG CANCER SCREENING NODULE FOLLOW-UP TECHNIQUE: Multidetector CT imaging of the chest was performed following the standard protocol without IV contrast. COMPARISON:  Low-dose lung cancer screening CT chest dated 07/05/2016 FINDINGS: Cardiovascular: The heart is  normal in size. No pericardial effusion. No evidence of thoracic aortic aneurysm. Mild atherosclerotic calcifications of the aortic arch. Mild coronary atherosclerosis the right coronary artery. Mediastinum/Nodes: No suspicious mediastinal lymphadenopathy. Visualized thyroid is unremarkable. Lungs/Pleura: Mild biapical pleural-parenchymal scarring. Mild centrilobular and paraseptal emphysematous changes, upper lobe predominant. Mild subpleural reticulation in the anterior upper lobes. Mild dependent atelectasis in the right lower lobe. No focal consolidation. Scattered small bilateral pulmonary nodules measuring up to 3.7 mm. No pleural effusion or pneumothorax. Upper Abdomen: Visualize upper abdomen is unremarkable. Musculoskeletal: Degenerative changes of the visualized thoracolumbar spine. IMPRESSION: Lung-RADS 2, benign appearance or behavior. Continue annual screening with low-dose chest CT without contrast in 12 months. Aortic Atherosclerosis (ICD10-I70.0) and Emphysema (ICD10-J43.9). Electronically Signed  By: Julian Hy M.D.   On: 01/10/2017 11:11     Assessment & Plan:   COPD (chronic obstructive pulmonary disease) ([place]) Stable without flare   Plan Patient Instructions  Continue on Albuterol As needed   Work on not smoking .  Mucinex DM Twice daily  As needed  Cough/congestion  Follow up with Dr. Elsworth Soho  In 1 year and As needed   Flu shot today .     Tobacco abuse Smoking cessation      Rexene Edison, NP 01/31/2017

## 2017-01-31 NOTE — Assessment & Plan Note (Signed)
Stable without flare   Plan Patient Instructions  Continue on Albuterol As needed   Work on not smoking .  Mucinex DM Twice daily  As needed  Cough/congestion  Follow up with Dr. Elsworth Soho  In 1 year and As needed   Flu shot today .

## 2017-02-01 MED ORDER — ALBUTEROL SULFATE HFA 108 (90 BASE) MCG/ACT IN AERS: 2.0000 | INHALATION_SPRAY | Freq: Four times a day (QID) | RESPIRATORY_TRACT | 5 refills | Status: AC | PRN

## 2017-02-01 NOTE — Addendum Note (Signed)
Addended by: Parke Poisson E on: 02/01/2017 09:57 AM   Modules accepted: Orders

## 2017-03-01 ENCOUNTER — Ambulatory Visit: Payer: PPO | Admitting: Pulmonary Disease

## 2017-03-05 ENCOUNTER — Telehealth: Payer: Self-pay

## 2017-03-05 DIAGNOSIS — E7800 Pure hypercholesterolemia, unspecified: Secondary | ICD-10-CM | POA: Diagnosis not present

## 2017-03-05 DIAGNOSIS — D179 Benign lipomatous neoplasm, unspecified: Secondary | ICD-10-CM | POA: Diagnosis not present

## 2017-03-05 DIAGNOSIS — I700 Atherosclerosis of aorta: Secondary | ICD-10-CM | POA: Diagnosis not present

## 2017-03-05 DIAGNOSIS — M810 Age-related osteoporosis without current pathological fracture: Secondary | ICD-10-CM | POA: Diagnosis not present

## 2017-03-05 DIAGNOSIS — R5383 Other fatigue: Secondary | ICD-10-CM | POA: Diagnosis not present

## 2017-03-05 DIAGNOSIS — Z79899 Other long term (current) drug therapy: Secondary | ICD-10-CM | POA: Diagnosis not present

## 2017-03-05 DIAGNOSIS — J449 Chronic obstructive pulmonary disease, unspecified: Secondary | ICD-10-CM | POA: Diagnosis not present

## 2017-03-05 DIAGNOSIS — R079 Chest pain, unspecified: Secondary | ICD-10-CM | POA: Diagnosis not present

## 2017-03-05 NOTE — Telephone Encounter (Signed)
SENT NOTES TO SCHEDULING 

## 2017-03-09 ENCOUNTER — Encounter: Payer: Self-pay | Admitting: Nurse Practitioner

## 2017-03-09 DIAGNOSIS — R079 Chest pain, unspecified: Secondary | ICD-10-CM | POA: Diagnosis not present

## 2017-03-09 DIAGNOSIS — E7800 Pure hypercholesterolemia, unspecified: Secondary | ICD-10-CM | POA: Diagnosis not present

## 2017-03-09 DIAGNOSIS — D179 Benign lipomatous neoplasm, unspecified: Secondary | ICD-10-CM | POA: Diagnosis not present

## 2017-03-09 DIAGNOSIS — R5383 Other fatigue: Secondary | ICD-10-CM | POA: Diagnosis not present

## 2017-03-09 DIAGNOSIS — J449 Chronic obstructive pulmonary disease, unspecified: Secondary | ICD-10-CM | POA: Diagnosis not present

## 2017-03-09 DIAGNOSIS — I700 Atherosclerosis of aorta: Secondary | ICD-10-CM | POA: Diagnosis not present

## 2017-03-09 DIAGNOSIS — Z79899 Other long term (current) drug therapy: Secondary | ICD-10-CM | POA: Diagnosis not present

## 2017-03-09 DIAGNOSIS — M810 Age-related osteoporosis without current pathological fracture: Secondary | ICD-10-CM | POA: Diagnosis not present

## 2017-03-12 ENCOUNTER — Encounter (INDEPENDENT_AMBULATORY_CARE_PROVIDER_SITE_OTHER): Payer: Self-pay

## 2017-03-12 ENCOUNTER — Encounter: Payer: Self-pay | Admitting: Nurse Practitioner

## 2017-03-12 ENCOUNTER — Ambulatory Visit (INDEPENDENT_AMBULATORY_CARE_PROVIDER_SITE_OTHER): Payer: PPO | Admitting: Nurse Practitioner

## 2017-03-12 ENCOUNTER — Telehealth: Payer: Self-pay | Admitting: Cardiology

## 2017-03-12 VITALS — BP 132/72 | HR 65 | Ht 63.0 in | Wt 140.8 lb

## 2017-03-12 DIAGNOSIS — I2584 Coronary atherosclerosis due to calcified coronary lesion: Secondary | ICD-10-CM | POA: Diagnosis not present

## 2017-03-12 DIAGNOSIS — J438 Other emphysema: Secondary | ICD-10-CM

## 2017-03-12 DIAGNOSIS — R0602 Shortness of breath: Secondary | ICD-10-CM

## 2017-03-12 DIAGNOSIS — R0789 Other chest pain: Secondary | ICD-10-CM

## 2017-03-12 DIAGNOSIS — Z8249 Family history of ischemic heart disease and other diseases of the circulatory system: Secondary | ICD-10-CM

## 2017-03-12 DIAGNOSIS — I700 Atherosclerosis of aorta: Secondary | ICD-10-CM

## 2017-03-12 DIAGNOSIS — I2510 Atherosclerotic heart disease of native coronary artery without angina pectoris: Secondary | ICD-10-CM | POA: Diagnosis not present

## 2017-03-12 LAB — HEPATIC FUNCTION PANEL
ALT: 15 IU/L (ref 0–32)
AST: 18 IU/L (ref 0–40)
Albumin: 4.6 g/dL (ref 3.6–4.8)
Alkaline Phosphatase: 88 IU/L (ref 39–117)
Bilirubin Total: 0.4 mg/dL (ref 0.0–1.2)
Bilirubin, Direct: 0.12 mg/dL (ref 0.00–0.40)
Total Protein: 7 g/dL (ref 6.0–8.5)

## 2017-03-12 LAB — BASIC METABOLIC PANEL
BUN/Creatinine Ratio: 10 — ABNORMAL LOW (ref 12–28)
BUN: 8 mg/dL (ref 8–27)
CO2: 23 mmol/L (ref 20–29)
Calcium: 9.5 mg/dL (ref 8.7–10.3)
Chloride: 102 mmol/L (ref 96–106)
Creatinine, Ser: 0.8 mg/dL (ref 0.57–1.00)
GFR calc Af Amer: 87 mL/min/{1.73_m2} (ref >59)
GFR calc non Af Amer: 75 mL/min/{1.73_m2} (ref >59)
Glucose: 85 mg/dL (ref 65–99)
Potassium: 4.4 mmol/L (ref 3.5–5.2)
Sodium: 142 mmol/L (ref 134–144)

## 2017-03-12 LAB — CBC
Hematocrit: 45.3 % (ref 34.0–46.6)
Hemoglobin: 14.7 g/dL (ref 11.1–15.9)
MCH: 29.1 pg (ref 26.6–33.0)
MCHC: 32.5 g/dL (ref 31.5–35.7)
MCV: 90 fL (ref 79–97)
Platelets: 237 10*3/uL (ref 150–379)
RBC: 5.05 x10E6/uL (ref 3.77–5.28)
RDW: 13.6 % (ref 12.3–15.4)
WBC: 8.9 10*3/uL (ref 3.4–10.8)

## 2017-03-12 LAB — LIPID PANEL
Chol/HDL Ratio: 2.9 ratio (ref 0.0–4.4)
Cholesterol, Total: 121 mg/dL (ref 100–199)
HDL: 42 mg/dL (ref >39)
LDL Calculated: 61 mg/dL (ref 0–99)
Triglycerides: 92 mg/dL (ref 0–149)
VLDL Cholesterol Cal: 18 mg/dL (ref 5–40)

## 2017-03-12 LAB — PT AND PTT
INR: 1.1 (ref 0.8–1.2)
Prothrombin Time: 10.9 s (ref 9.1–12.0)
aPTT: 30 s (ref 24–33)

## 2017-03-12 MED ORDER — VARENICLINE TARTRATE 0.5 MG X 11 & 1 MG X 42 PO MISC: ORAL | 0 refills | Status: DC

## 2017-03-12 NOTE — Patient Instructions (Addendum)
We will be checking the following labs today - BMET, CBC, PT, PTT, HPF and lipids   Medication Instructions:    Continue with your current medicines.     Testing/Procedures To Be Arranged:  Cardiac catheterization    Other Special Instructions:   Your provider has recommended a cardiac catherization  You are scheduled for a cardiac catheterization on Friday, October 26th at 7:30 AM with Dr. Angelena Form or associate.  Please arrive at the [facility] (Main Entrance) at [facility] at [address] [place] on Friday, October 26th at 5:30 AM.    Special note: Every effort is made to have your procedure done on time.   Please understand that emergencies sometimes delay a scheduled   procedure.  No food or drink after midnight on Thursday.  You may take your morning medications with a sip of water on the day of your procedure.  Please take a baby aspirin (81 mg) on the morning of your procedure.   Medications to [place] for a one night stay -- bring personal belongings.  Bring a current list of your medications and current insurance cards.  You MUST have a responsible person to drive you home. Someone MUST be with you the first 24 hours after you arrive home or your discharge will be delayed. Wear clothes that are easy to get on and off and wear slip on shoes.    Coronary Angiogram A coronary angiogram, also called coronary angiography, is an X-ray procedure used to look at the arteries in the heart. In this procedure, a dye (contrast dye) is injected through a long, hollow tube (catheter). The catheter is about the size of a piece of cooked spaghetti and is inserted through your groin, wrist, or arm. The dye is injected into each artery, and X-rays are then taken to show if there is a blockage in the arteries of your heart.  LET [facility] CARE PROVIDER KNOW ABOUT: Any allergies you have, including allergies to shellfish or  contrast dye.  All medicines you are taking, including vitamins, herbs, eye drops, creams, and over-the-counter medicines.  Previous problems you or members of your family have had with the use of anesthetics.  Any blood disorders you have.  Previous surgeries you have had. History of kidney problems or failure.  Other medical conditions you have.  RISKS AND COMPLICATIONS  Generally, a coronary angiogram is a safe procedure. However, about 1 person out of 1000 can have problems that may include: Allergic reaction to the dye. Bleeding/bruising from the access site or other locations. Kidney injury, especially in people with impaired kidney function. Stroke (rare). Heart attack (rare). Irregular rhythms (rare) Death (rare)  BEFORE THE PROCEDURE  Do not eat or drink anything after midnight the night before the procedure or as directed by your health care provider.  Ask your health care provider about changing or stopping your regular medicines. This is especially important if you are taking diabetes medicines or blood thinners.  PROCEDURE You may be given a medicine to help you relax (sedative) before the procedure. This medicine is given through an intravenous (IV) access tube that is inserted into one of your veins.  The area where the catheter will be inserted will be washed and shaved. This is usually done in the groin but may be done in the fold of your arm (near your elbow) or in the wrist.  A medicine will be given to  numb the area where the catheter will be inserted (local anesthetic).  The health care provider will insert the catheter into an artery. The catheter will be guided by using a special type of X-ray (fluoroscopy) of the blood vessel being examined.  A special dye will then be injected into the catheter, and X-rays will be taken. The dye will help to show where any narrowing or blockages are located in the heart arteries.    AFTER THE PROCEDURE  If the  procedure is done through the leg, you will be kept in bed lying flat for several hours. You will be instructed to not bend or cross your legs. The insertion site will be checked frequently.  The pulse in your feet or wrist will be checked frequently.  Additional blood tests, X-rays, and an electrocardiogram may be done.         If you need a refill on your cardiac medications before your next appointment, please call your pharmacy.   Call the [place] office at [phone] if you have any questions, problems or concerns.

## 2017-03-12 NOTE — Telephone Encounter (Signed)
Pt aware we pre-cert every procedure ordered and the case is outpatient.

## 2017-03-12 NOTE — Addendum Note (Signed)
Addended by: Burtis Junes on: 03/12/2017 10:13 AM   Modules accepted: Orders

## 2017-03-12 NOTE — Progress Notes (Addendum)
CARDIOLOGY OFFICE NOTE  Date:  03/12/2017    Casey Kirk Date of Birth: 1946-03-03 Medical Record #[id number]  PCP:  Casey Cruel, MD  Cardiologist:  [facility]  Chief Complaint  Patient presents with  . Chest Pain  . Shortness of Breath    Work in visit - seen for Dr. Marlou Kirk    History of Present Illness: Casey Kirk is a 71 y.o. female who presents today for a work in visit. Seen for Dr. Marlou Kirk.   Referred here orignially for evaluation of abnormal CT scan on the chest showing coronary calcification.  She is a long-standing smoker with COPD.  Other issues include bipolar disorder, GERD, IBS, HLD (now on statin therapy) and thyroid disease.   Seen here back in March - some atypical chest pain endorsed - Myoview updated - risk factor modification was encouraged.   Comes in today. Here with her husband. Has been referred back here from her PCP - Dr. Harrington Kirk - for re-evaluation for chest pain/shortness of breath.  She notes she continues to have this "chest soreness" - it is worse with exertion. Has had with trying to exercise as well as with doing housework. She remains short of breath. Her energy level is reduced. She has had another CT scan back in August - continuing to show atherosclerosis - now in the aortic arch as well as in the RCA. Asking "what to do to stop this". She continues to smoke. She is just on low dose statin - has not had repeat labs.   Past Medical History:  Diagnosis Date  . Allergic rhinitis   . ARTHRITIS, HX OF 01/09/2007  . BIPOLAR II DISORDER 03/17/2008  . 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 COPD 12/21/2006  . GERD (gastroesophageal reflux disease)   . GOUT 01/09/2007  . Hx of colonic polyps   . IBS (irritable bowel syndrome)   . Osteoporosis   . Sinusitis   . Thyroid disease   . TINEA CORPORIS 10/06/2009    Past Surgical History:  Procedure Laterality Date  . ABDOMINAL HYSTERECTOMY     CA cells removed end of vagina  . BREAST SURGERY      biopsy  . vaginal carcinoma surgery       Medications: Current Meds  Medication Sig  . albuterol (PROVENTIL HFA;VENTOLIN HFA) 108 (90 Base) MCG/ACT inhaler Inhale 2 puffs into the lungs every 6 (six) hours as needed for wheezing or shortness of breath.  Marland Kitchen aspirin 81 MG tablet Take 81 mg by mouth daily.    . calcium citrate-vitamin D (CITRACAL+D) 315-200 MG-UNIT per tablet Take 2 tablets by mouth daily.   . fish oil-omega-3 fatty acids 1000 MG capsule Take 1 g by mouth daily.   . fluticasone (FLONASE) 50 MCG/ACT nasal spray Place 2 sprays into both nostrils daily as needed for allergies or rhinitis.  Marland Kitchen lamoTRIgine (LAMICTAL) 25 MG tablet Take 25 mg by mouth daily.  . Melatonin 1 MG TABS Take 2 mg by mouth at bedtime.  . Multiple Vitamin (MULTIVITAMIN) tablet Take 1 tablet by mouth daily.    Marland Kitchen omeprazole (PRILOSEC) 40 MG capsule Take 40 mg by mouth daily as needed. indigestion  . simvastatin (ZOCOR) 20 MG tablet Take 20 mg by mouth daily.  . traZODone (DESYREL) 50 MG tablet Take 50 mg by mouth at bedtime.  . vitamin E 100 UNIT capsule Take by mouth daily.     Allergies: Allergies  Allergen Reactions  .  Hydrocodone-Acetaminophen Itching and Swelling  . Latex Itching and Swelling  . Pneumococcal Polysaccharide Vaccine Other (See Comments)    other    Social History: The patient  reports that she has been smoking Cigarettes.  She has a 20.00 pack-year smoking history. She has never used smokeless tobacco. She reports that she drinks alcohol. She reports that she does not use drugs.   Family History: The patient's family history includes Alcohol abuse in her other; Arthritis in her other; COPD in her mother; Cancer in her other; Colon polyps in her brother and mother; Diabetes in her other; Heart attack (age of onset: 8) in her brother; Heart disease in her other; Hyperlipidemia in her other; Hypertension in her other; Sudden death in her other; Tremor in her mother.   Review of  Systems: Please see the history of present illness.   Otherwise, the review of systems is positive for none.   All other systems are reviewed and negative.   Physical Exam: VS:  BP 132/72   Pulse 65   Ht 5\' 3"  (1.6 m)   Wt 140 lb 12.8 oz (63.9 kg)   SpO2 98%   BMI 24.94 kg/m  .  BMI Body mass index is 24.94 kg/m.  Wt Readings from Last 3 Encounters:  03/12/17 140 lb 12.8 oz (63.9 kg)  01/31/17 141 lb (64 kg)  10/03/16 141 lb (64 kg)    General: Pleasant. She speaks slowly. Alert and in no acute distress.   HEENT: Normal.  Neck: Supple, no JVD, carotid bruits, or masses noted.  Cardiac: Regular rate and rhythm. No murmurs, rubs, or gallops. No edema.  Respiratory:  Lungs are coarse but with normal work of breathing.  GI: Soft and nontender.  MS: No deformity or atrophy. Gait and ROM intact.  Skin: Warm and dry. Color is normal.  Neuro:  Strength and sensation are intact and no gross focal deficits noted.  Psych: Alert, appropriate and with normal affect.   LABORATORY DATA:  EKG:  EKG is ordered today. This demonstrates NSR.  Lab Results  Component Value Date   WBC 10.0 05/10/2015   HGB 14.8 05/10/2015   HCT 45.5 05/10/2015   PLT 222 05/10/2015   GLUCOSE 102 (H) 05/10/2015   CHOL 182 09/22/2010   TRIG 103.0 09/22/2010   HDL 36.80 (L) 09/22/2010   LDLCALC 125 (H) 09/22/2010   ALT 15 09/22/2010   AST 17 09/22/2010   NA 143 05/10/2015   K 3.6 05/10/2015   CL 109 05/10/2015   CREATININE 0.77 05/10/2015   BUN 7 05/10/2015   CO2 25 05/10/2015   TSH 1.70 10/13/2010   INR 1.0 05/11/2007       BNP (last 3 results) No results for input(s): BNP in the last 8760 hours.  ProBNP (last 3 results) No results for input(s): PROBNP in the last 8760 hours.   Other Studies Reviewed Today:  Myoview Study Highlights 08/2016    Nuclear stress EF: 71%.  Blood pressure demonstrated a normal response to exercise.  Upsloping ST segment depression ST segment depression  of 1 mm was noted during stress in the II, III, aVF, V5 and V6 leads.  Findings consistent with prior myocardial infarction.  This is a low risk study.  The left ventricular ejection fraction is hyperdynamic (>65%).   Small inferoapical wall infarction  EF is normal 71% Extracardiac activity in this region makes specificity of finding lower Patient only achieved 87% PMHR ECG has 1 mm ST segment depression  In recovery      Assessment/Plan:  1. Chest pain/dyspnea - has known coronary artery calcification - endorsing more symptoms - low risk Myoview from 08/2016 but with small inferoapical infarction - will arrange for cardiac catheterization in light of progressive symptoms - The patient understands that risks include but are not limited to stroke (1 in 1000), death (1 in 1000), kidney failure [usually temporary] (1 in 500), bleeding (1 in 200), allergic reaction [possibly serious] (1 in 200), and agrees to proceed. Labs today. Reminded numerous times that smoking cessation is imperative. Arranged for this Friday with Dr. Angelena Form.   2. Noted aortic atherosclerosis on repeat CT scanning - explained that she needs smoking cessation, treatment of her lipids as well to prevent progression.   3. COPD/tobacco use  - We discussed tobacco cessation today and the importance for reduction of cardiovascular illness. Asked for RX for Chantix after visit was completed. She has apparently tried this before.   4. Strong family history of CAD  - Brother with MI age 13. He was also a smoker.  5. HLD- needs lipids rechecked - may need to consider more aggressive therapy.   Current medicines are reviewed with the patient today.  The patient does not have concerns regarding medicines other than what has been noted above.  The following changes have been made:  See above.  Labs/ tests ordered today include:    Orders Placed This Encounter  Procedures  . Basic metabolic panel  . CBC  . Hepatic  function panel  . Lipid panel  . PT and PTT  . EKG 12-Lead     Disposition:   Further disposition pending.  Patient is agreeable to this plan and will call if any problems develop in the interim.   SignedTruitt Merle, NP  03/12/2017 10:01 AM  [facility] [address] [place], [place]  [address] Phone: [phone] Fax: [phone]

## 2017-03-12 NOTE — Telephone Encounter (Signed)
Lm to cb - per pre-cert pt has [employer] and generally there is not an issue with coverage they just need a notification.  Pre-cert department is working on this.  The procedure is scheduled as an outpatient cardiac cath.

## 2017-03-12 NOTE — Telephone Encounter (Signed)
New message     Pt wants to know if you have already called an got the pre authorization for the procedure on 03/16/17 , is it going to be in patient or out patient ? Please call with specifics

## 2017-03-15 ENCOUNTER — Telehealth: Payer: Self-pay | Admitting: Cardiology

## 2017-03-15 NOTE — Telephone Encounter (Signed)
New message   Patient calling to confirm procedure is approved for 10/26. Please call

## 2017-03-16 ENCOUNTER — Ambulatory Visit (HOSPITAL_COMMUNITY)
Admission: RE | Disposition: A | Discharge status: Home / Self Care | Payer: Self-pay | Source: Ambulatory Visit | Attending: Cardiovascular Disease

## 2017-03-16 ENCOUNTER — Ambulatory Visit (HOSPITAL_COMMUNITY)
Admission: RE | Admit: 2017-03-16 | Discharge: 2017-03-16 | Disposition: A | Discharge status: Home / Self Care | Payer: PPO | Source: Ambulatory Visit | Attending: Cardiovascular Disease | Admitting: Cardiovascular Disease

## 2017-03-16 DIAGNOSIS — I25110 Atherosclerotic heart disease of native coronary artery with unstable angina pectoris: Secondary | ICD-10-CM | POA: Diagnosis not present

## 2017-03-16 DIAGNOSIS — Z7982 Long term (current) use of aspirin: Secondary | ICD-10-CM | POA: Diagnosis not present

## 2017-03-16 DIAGNOSIS — Z955 Presence of coronary angioplasty implant and graft: Secondary | ICD-10-CM

## 2017-03-16 DIAGNOSIS — I2584 Coronary atherosclerosis due to calcified coronary lesion: Secondary | ICD-10-CM | POA: Diagnosis not present

## 2017-03-16 DIAGNOSIS — I2510 Atherosclerotic heart disease of native coronary artery without angina pectoris: Secondary | ICD-10-CM | POA: Diagnosis not present

## 2017-03-16 DIAGNOSIS — Z91040 Latex allergy status: Secondary | ICD-10-CM | POA: Diagnosis not present

## 2017-03-16 DIAGNOSIS — I700 Atherosclerosis of aorta: Secondary | ICD-10-CM

## 2017-03-16 DIAGNOSIS — K219 Gastro-esophageal reflux disease without esophagitis: Secondary | ICD-10-CM | POA: Diagnosis not present

## 2017-03-16 DIAGNOSIS — Z8249 Family history of ischemic heart disease and other diseases of the circulatory system: Secondary | ICD-10-CM | POA: Insufficient documentation

## 2017-03-16 DIAGNOSIS — M109 Gout, unspecified: Secondary | ICD-10-CM | POA: Insufficient documentation

## 2017-03-16 DIAGNOSIS — F319 Bipolar disorder, unspecified: Secondary | ICD-10-CM | POA: Diagnosis not present

## 2017-03-16 DIAGNOSIS — Z7951 Long term (current) use of inhaled steroids: Secondary | ICD-10-CM | POA: Insufficient documentation

## 2017-03-16 DIAGNOSIS — K589 Irritable bowel syndrome without diarrhea: Secondary | ICD-10-CM | POA: Insufficient documentation

## 2017-03-16 DIAGNOSIS — F17210 Nicotine dependence, cigarettes, uncomplicated: Secondary | ICD-10-CM | POA: Diagnosis not present

## 2017-03-16 DIAGNOSIS — E079 Disorder of thyroid, unspecified: Secondary | ICD-10-CM | POA: Diagnosis not present

## 2017-03-16 DIAGNOSIS — J449 Chronic obstructive pulmonary disease, unspecified: Secondary | ICD-10-CM | POA: Diagnosis not present

## 2017-03-16 DIAGNOSIS — E785 Hyperlipidemia, unspecified: Secondary | ICD-10-CM | POA: Insufficient documentation

## 2017-03-16 DIAGNOSIS — M1990 Unspecified osteoarthritis, unspecified site: Secondary | ICD-10-CM | POA: Insufficient documentation

## 2017-03-16 DIAGNOSIS — M810 Age-related osteoporosis without current pathological fracture: Secondary | ICD-10-CM | POA: Diagnosis not present

## 2017-03-16 DIAGNOSIS — R0789 Other chest pain: Secondary | ICD-10-CM

## 2017-03-16 HISTORY — PX: INTRAVASCULAR PRESSURE WIRE/FFR STUDY: CATH118243

## 2017-03-16 HISTORY — PX: LEFT HEART CATH AND CORONARY ANGIOGRAPHY: CATH118249

## 2017-03-16 HISTORY — PX: CORONARY STENT INTERVENTION: CATH118234

## 2017-03-16 LAB — POCT ACTIVATED CLOTTING TIME
Activated Clotting Time: 257 seconds
Activated Clotting Time: 340 seconds

## 2017-03-16 SURGERY — LEFT HEART CATH AND CORONARY ANGIOGRAPHY
Anesthesia: LOCAL

## 2017-03-16 MED ORDER — ADENOSINE (DIAGNOSTIC) 140MCG/KG/MIN
INTRAVENOUS | Status: DC | PRN
  Administered 2017-03-16: 140 ug/kg/min via INTRAVENOUS

## 2017-03-16 MED ORDER — CLOPIDOGREL BISULFATE 75 MG PO TABS: 75.0000 mg | ORAL_TABLET | Freq: Every day | ORAL | Status: DC

## 2017-03-16 MED ORDER — SODIUM CHLORIDE 0.9% FLUSH: 3.0000 mL | Freq: Two times a day (BID) | INTRAVENOUS | Status: DC

## 2017-03-16 MED ORDER — CLOPIDOGREL BISULFATE 300 MG PO TABS
ORAL_TABLET | ORAL | Status: DC | PRN
  Administered 2017-03-16: 600 mg via ORAL

## 2017-03-16 MED ORDER — SODIUM CHLORIDE 0.9% FLUSH: 3.0000 mL | INTRAVENOUS | Status: DC | PRN

## 2017-03-16 MED ORDER — ADENOSINE 12 MG/4ML IV SOLN
INTRAVENOUS | Status: AC
  Filled 2017-03-16: qty 16

## 2017-03-16 MED ORDER — DIAZEPAM 5 MG PO TABS
ORAL_TABLET | ORAL | Status: AC
  Filled 2017-03-16: qty 2

## 2017-03-16 MED ORDER — IOPAMIDOL (ISOVUE-370) INJECTION 76%
INTRAVENOUS | Status: AC
  Filled 2017-03-16: qty 100

## 2017-03-16 MED ORDER — SODIUM CHLORIDE 0.9 % IV SOLN: 250.0000 mL | INTRAVENOUS | Status: DC | PRN

## 2017-03-16 MED ORDER — SIMVASTATIN 20 MG PO TABS: 20.0000 mg | ORAL_TABLET | Freq: Every evening | ORAL | Status: DC

## 2017-03-16 MED ORDER — HEPARIN (PORCINE) IN NACL 2-0.9 UNIT/ML-% IJ SOLN
INTRAMUSCULAR | Status: AC
  Filled 2017-03-16: qty 1000

## 2017-03-16 MED ORDER — ANGIOPLASTY BOOK
Freq: Once | Status: AC
  Administered 2017-03-16: 16:00:00
  Filled 2017-03-16: qty 1

## 2017-03-16 MED ORDER — CLOPIDOGREL BISULFATE 300 MG PO TABS
ORAL_TABLET | ORAL | Status: AC
  Filled 2017-03-16: qty 2

## 2017-03-16 MED ORDER — FENTANYL CITRATE (PF) 100 MCG/2ML IJ SOLN
INTRAMUSCULAR | Status: DC | PRN
  Administered 2017-03-16 (×2): 25 ug via INTRAVENOUS

## 2017-03-16 MED ORDER — DIAZEPAM 5 MG PO TABS
10.0000 mg | ORAL_TABLET | ORAL | Status: AC
  Administered 2017-03-16: 10 mg via ORAL

## 2017-03-16 MED ORDER — HEPARIN (PORCINE) IN NACL 2-0.9 UNIT/ML-% IJ SOLN
INTRAMUSCULAR | Status: AC | PRN
  Administered 2017-03-16: 1000 mL

## 2017-03-16 MED ORDER — MIDAZOLAM HCL 2 MG/2ML IJ SOLN
INTRAMUSCULAR | Status: AC
  Filled 2017-03-16: qty 2

## 2017-03-16 MED ORDER — ONDANSETRON HCL 4 MG/2ML IJ SOLN: 4.0000 mg | Freq: Four times a day (QID) | INTRAMUSCULAR | Status: DC | PRN

## 2017-03-16 MED ORDER — IOPAMIDOL (ISOVUE-370) INJECTION 76%
INTRAVENOUS | Status: AC
  Filled 2017-03-16: qty 50

## 2017-03-16 MED ORDER — PANTOPRAZOLE SODIUM 40 MG PO TBEC: 40.0000 mg | DELAYED_RELEASE_TABLET | Freq: Every day | ORAL | 6 refills | Status: DC

## 2017-03-16 MED ORDER — ASPIRIN 81 MG PO CHEW: 81.0000 mg | CHEWABLE_TABLET | ORAL | Status: DC

## 2017-03-16 MED ORDER — SODIUM CHLORIDE 0.9 % IV SOLN
INTRAVENOUS | Status: DC
  Administered 2017-03-16: 06:00:00 via INTRAVENOUS

## 2017-03-16 MED ORDER — MIDAZOLAM HCL 2 MG/2ML IJ SOLN
INTRAMUSCULAR | Status: DC | PRN
  Administered 2017-03-16 (×2): 1 mg via INTRAVENOUS

## 2017-03-16 MED ORDER — CLOPIDOGREL BISULFATE 75 MG PO TABS: 75.0000 mg | ORAL_TABLET | Freq: Every day | ORAL | 0 refills | Status: DC

## 2017-03-16 MED ORDER — NITROGLYCERIN 1 MG/10 ML FOR IR/CATH LAB
INTRA_ARTERIAL | Status: DC | PRN
  Administered 2017-03-16 (×2): 200 ug via INTRACORONARY

## 2017-03-16 MED ORDER — VERAPAMIL HCL 2.5 MG/ML IV SOLN
INTRAVENOUS | Status: DC | PRN
  Administered 2017-03-16: 10 mL via INTRA_ARTERIAL

## 2017-03-16 MED ORDER — SODIUM CHLORIDE 0.9 % IV SOLN: INTRAVENOUS | Status: AC

## 2017-03-16 MED ORDER — HYDRALAZINE HCL 20 MG/ML IJ SOLN: 5.0000 mg | INTRAMUSCULAR | Status: AC | PRN

## 2017-03-16 MED ORDER — NITROGLYCERIN 0.4 MG SL SUBL: 0.4000 mg | SUBLINGUAL_TABLET | SUBLINGUAL | 12 refills | Status: DC | PRN

## 2017-03-16 MED ORDER — IOPAMIDOL (ISOVUE-370) INJECTION 76%
INTRAVENOUS | Status: DC | PRN
  Administered 2017-03-16: 140 mL via INTRA_ARTERIAL

## 2017-03-16 MED ORDER — ASPIRIN 81 MG PO TABS: 81.0000 mg | ORAL_TABLET | Freq: Every day | ORAL | Status: DC

## 2017-03-16 MED ORDER — HEPARIN SODIUM (PORCINE) 1000 UNIT/ML IJ SOLN
INTRAMUSCULAR | Status: AC
  Filled 2017-03-16: qty 1

## 2017-03-16 MED ORDER — VERAPAMIL HCL 2.5 MG/ML IV SOLN
INTRAVENOUS | Status: AC
  Filled 2017-03-16: qty 2

## 2017-03-16 MED ORDER — CLOPIDOGREL BISULFATE 75 MG PO TABS: 75.0000 mg | ORAL_TABLET | Freq: Every day | ORAL | 11 refills | Status: DC

## 2017-03-16 MED ORDER — HEPARIN SODIUM (PORCINE) 1000 UNIT/ML IJ SOLN
INTRAMUSCULAR | Status: DC | PRN
  Administered 2017-03-16: 4000 [IU] via INTRAVENOUS
  Administered 2017-03-16: 2000 [IU] via INTRAVENOUS
  Administered 2017-03-16: 3500 [IU] via INTRAVENOUS

## 2017-03-16 MED ORDER — LIDOCAINE HCL (PF) 1 % IJ SOLN
INTRAMUSCULAR | Status: DC | PRN
  Administered 2017-03-16: 2 mL via INTRADERMAL

## 2017-03-16 MED ORDER — LABETALOL HCL 5 MG/ML IV SOLN: 10.0000 mg | INTRAVENOUS | Status: AC | PRN

## 2017-03-16 MED ORDER — TRAZODONE HCL 50 MG PO TABS: 50.0000 mg | ORAL_TABLET | Freq: Every day | ORAL | Status: DC

## 2017-03-16 MED ORDER — NITROGLYCERIN 1 MG/10 ML FOR IR/CATH LAB
INTRA_ARTERIAL | Status: AC
  Filled 2017-03-16: qty 10

## 2017-03-16 MED ORDER — LIDOCAINE HCL 2 % IJ SOLN
INTRAMUSCULAR | Status: AC
  Filled 2017-03-16: qty 20

## 2017-03-16 MED ORDER — FENTANYL CITRATE (PF) 100 MCG/2ML IJ SOLN
INTRAMUSCULAR | Status: AC
  Filled 2017-03-16: qty 2

## 2017-03-16 MED FILL — CLOPIDOGREL 75 MG TABLET: 75 | 30 days supply | Qty: 30 | Fill #0

## 2017-03-16 SURGICAL SUPPLY — 22 items
BALLN EMERGE MR 2.0X12 (BALLOONS) ×2
BALLOON EMERGE MR 2.0X12 (BALLOONS) IMPLANT
CATH INFINITI 5 FR JL3.5 (CATHETERS) ×1 IMPLANT
CATH INFINITI 5FR ANG PIGTAIL (CATHETERS) ×1 IMPLANT
CATH INFINITI JR4 5F (CATHETERS) ×1 IMPLANT
CATH LAUNCHER 5F JL3.5 (CATHETERS) IMPLANT
CATH MICROCATH NAVVUS (MICROCATHETER) IMPLANT
CATHETER LAUNCHER 5F JL3.5 (CATHETERS) ×2
DEVICE RAD COMP TR BAND LRG (VASCULAR PRODUCTS) ×1 IMPLANT
GLIDESHEATH SLEND SS 6F .021 (SHEATH) ×1 IMPLANT
GUIDEWIRE INQWIRE 1.5J.035X260 (WIRE) IMPLANT
INQWIRE 1.5J .035X260CM (WIRE) ×2
KIT ENCORE 26 ADVANTAGE (KITS) ×1 IMPLANT
KIT ESSENTIALS PG (KITS) ×1 IMPLANT
KIT HEART LEFT (KITS) ×2 IMPLANT
MICROCATHETER NAVVUS (MICROCATHETER) ×2
PACK CARDIAC CATHETERIZATION (CUSTOM PROCEDURE TRAY) ×2 IMPLANT
STENT PROMUS PREM MR 2.75X16 (Permanent Stent) ×1 IMPLANT
SYR MEDRAD MARK V 150ML (SYRINGE) ×2 IMPLANT
TRANSDUCER W/STOPCOCK (MISCELLANEOUS) ×2 IMPLANT
TUBING CIL FLEX 10 FLL-RA (TUBING) ×2 IMPLANT
WIRE COUGAR XT STRL 190CM (WIRE) ×1 IMPLANT

## 2017-03-16 NOTE — Progress Notes (Signed)
1120-1220 Education completed with pt and significant other who voiced understanding. Reviewed NTG use, risk factors, ex ed, heart healthy diet and CRP 2. Gave pt fake cigarette and smoking cessation handout. Encouraged to call 1800quitnow as needed. Pt would like referral to [place] and [employer] [facility] as she spends time at both places. Will refer to [place] and send letter of interest to [place]. Stressed importance of plavix with stent. Pt plans to start chantix to assist with smoking cessation. Graylon Good RN BSN 03/16/2017 12:18 PM

## 2017-03-16 NOTE — H&P (View-Only) (Signed)
CARDIOLOGY OFFICE NOTE  Date:  03/12/2017    Casey Kirk Date of Birth: 1945/10/21 Medical Record #[id number]  PCP:  Lawerance Cruel, MD  Cardiologist:  [facility]  Chief Complaint  Patient presents with  . Chest Pain  . Shortness of Breath    Work in visit - seen for Dr. Marlou Porch    History of Present Illness: Casey Kirk is a 71 y.o. female who presents today for a work in visit. Seen for Dr. Marlou Porch.   Referred here orignially for evaluation of abnormal CT scan on the chest showing coronary calcification.  She is a long-standing smoker with COPD.  Other issues include bipolar disorder, GERD, IBS, HLD (now on statin therapy) and thyroid disease.   Seen here back in March - some atypical chest pain endorsed - Myoview updated - risk factor modification was encouraged.   Comes in today. Here with her husband. Has been referred back here from her PCP - Dr. Harrington Challenger - for re-evaluation for chest pain/shortness of breath.  She notes she continues to have this "chest soreness" - it is worse with exertion. Has had with trying to exercise as well as with doing housework. She remains short of breath. Her energy level is reduced. She has had another CT scan back in August - continuing to show atherosclerosis - now in the aortic arch as well as in the RCA. Asking "what to do to stop this". She continues to smoke. She is just on low dose statin - has not had repeat labs.   Past Medical History:  Diagnosis Date  . Allergic rhinitis   . ARTHRITIS, HX OF 01/09/2007  . BIPOLAR II DISORDER 03/17/2008  . 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 COPD 12/21/2006  . GERD (gastroesophageal reflux disease)   . GOUT 01/09/2007  . Hx of colonic polyps   . IBS (irritable bowel syndrome)   . Osteoporosis   . Sinusitis   . Thyroid disease   . TINEA CORPORIS 10/06/2009    Past Surgical History:  Procedure Laterality Date  . ABDOMINAL HYSTERECTOMY     CA cells removed end of vagina  . BREAST SURGERY      biopsy  . vaginal carcinoma surgery       Medications: Current Meds  Medication Sig  . albuterol (PROVENTIL HFA;VENTOLIN HFA) 108 (90 Base) MCG/ACT inhaler Inhale 2 puffs into the lungs every 6 (six) hours as needed for wheezing or shortness of breath.  Marland Kitchen aspirin 81 MG tablet Take 81 mg by mouth daily.    . calcium citrate-vitamin D (CITRACAL+D) 315-200 MG-UNIT per tablet Take 2 tablets by mouth daily.   . fish oil-omega-3 fatty acids 1000 MG capsule Take 1 g by mouth daily.   . fluticasone (FLONASE) 50 MCG/ACT nasal spray Place 2 sprays into both nostrils daily as needed for allergies or rhinitis.  Marland Kitchen lamoTRIgine (LAMICTAL) 25 MG tablet Take 25 mg by mouth daily.  . Melatonin 1 MG TABS Take 2 mg by mouth at bedtime.  . Multiple Vitamin (MULTIVITAMIN) tablet Take 1 tablet by mouth daily.    Marland Kitchen omeprazole (PRILOSEC) 40 MG capsule Take 40 mg by mouth daily as needed. indigestion  . simvastatin (ZOCOR) 20 MG tablet Take 20 mg by mouth daily.  . traZODone (DESYREL) 50 MG tablet Take 50 mg by mouth at bedtime.  . vitamin E 100 UNIT capsule Take by mouth daily.     Allergies: Allergies  Allergen Reactions  .  Hydrocodone-Acetaminophen Itching and Swelling  . Latex Itching and Swelling  . Pneumococcal Polysaccharide Vaccine Other (See Comments)    other    Social History: The patient  reports that she has been smoking Cigarettes.  She has a 20.00 pack-year smoking history. She has never used smokeless tobacco. She reports that she drinks alcohol. She reports that she does not use drugs.   Family History: The patient's family history includes Alcohol abuse in her other; Arthritis in her other; COPD in her mother; Cancer in her other; Colon polyps in her brother and mother; Diabetes in her other; Heart attack (age of onset: 78) in her brother; Heart disease in her other; Hyperlipidemia in her other; Hypertension in her other; Sudden death in her other; Tremor in her mother.   Review of  Systems: Please see the history of present illness.   Otherwise, the review of systems is positive for none.   All other systems are reviewed and negative.   Physical Exam: VS:  BP 132/72   Pulse 65   Ht 5\' 3"  (1.6 m)   Wt 140 lb 12.8 oz (63.9 kg)   SpO2 98%   BMI 24.94 kg/m  .  BMI Body mass index is 24.94 kg/m.  Wt Readings from Last 3 Encounters:  03/12/17 140 lb 12.8 oz (63.9 kg)  01/31/17 141 lb (64 kg)  10/03/16 141 lb (64 kg)    General: Pleasant. She speaks slowly. Alert and in no acute distress.   HEENT: Normal.  Neck: Supple, no JVD, carotid bruits, or masses noted.  Cardiac: Regular rate and rhythm. No murmurs, rubs, or gallops. No edema.  Respiratory:  Lungs are coarse but with normal work of breathing.  GI: Soft and nontender.  MS: No deformity or atrophy. Gait and ROM intact.  Skin: Warm and dry. Color is normal.  Neuro:  Strength and sensation are intact and no gross focal deficits noted.  Psych: Alert, appropriate and with normal affect.   LABORATORY DATA:  EKG:  EKG is ordered today. This demonstrates NSR.  Lab Results  Component Value Date   WBC 10.0 05/10/2015   HGB 14.8 05/10/2015   HCT 45.5 05/10/2015   PLT 222 05/10/2015   GLUCOSE 102 (H) 05/10/2015   CHOL 182 09/22/2010   TRIG 103.0 09/22/2010   HDL 36.80 (L) 09/22/2010   LDLCALC 125 (H) 09/22/2010   ALT 15 09/22/2010   AST 17 09/22/2010   NA 143 05/10/2015   K 3.6 05/10/2015   CL 109 05/10/2015   CREATININE 0.77 05/10/2015   BUN 7 05/10/2015   CO2 25 05/10/2015   TSH 1.70 10/13/2010   INR 1.0 05/11/2007       BNP (last 3 results) No results for input(s): BNP in the last 8760 hours.  ProBNP (last 3 results) No results for input(s): PROBNP in the last 8760 hours.   Other Studies Reviewed Today:  Myoview Study Highlights 08/2016    Nuclear stress EF: 71%.  Blood pressure demonstrated a normal response to exercise.  Upsloping ST segment depression ST segment depression  of 1 mm was noted during stress in the II, III, aVF, V5 and V6 leads.  Findings consistent with prior myocardial infarction.  This is a low risk study.  The left ventricular ejection fraction is hyperdynamic (>65%).   Small inferoapical wall infarction  EF is normal 71% Extracardiac activity in this region makes specificity of finding lower Patient only achieved 87% PMHR ECG has 1 mm ST segment depression  In recovery      Assessment/Plan:  1. Chest pain/dyspnea - has known coronary artery calcification - endorsing more symptoms - low risk Myoview from 08/2016 but with small inferoapical infarction - will arrange for cardiac catheterization in light of progressive symptoms - The patient understands that risks include but are not limited to stroke (1 in 1000), death (1 in 1000), kidney failure [usually temporary] (1 in 500), bleeding (1 in 200), allergic reaction [possibly serious] (1 in 200), and agrees to proceed. Labs today. Reminded numerous times that smoking cessation is imperative. Arranged for this Friday with Dr. Angelena Form.   2. Noted aortic atherosclerosis on repeat CT scanning - explained that she needs smoking cessation, treatment of her lipids as well to prevent progression.   3. COPD/tobacco use  - We discussed tobacco cessation today and the importance for reduction of cardiovascular illness. Asked for RX for Chantix after visit was completed. She has apparently tried this before.   4. Strong family history of CAD  - Brother with MI age 68. He was also a smoker.  5. HLD- needs lipids rechecked - may need to consider more aggressive therapy.   Current medicines are reviewed with the patient today.  The patient does not have concerns regarding medicines other than what has been noted above.  The following changes have been made:  See above.  Labs/ tests ordered today include:    Orders Placed This Encounter  Procedures  . Basic metabolic panel  . CBC  . Hepatic  function panel  . Lipid panel  . PT and PTT  . EKG 12-Lead     Disposition:   Further disposition pending.  Patient is agreeable to this plan and will call if any problems develop in the interim.   SignedTruitt Merle, NP  03/12/2017 10:01 AM  [place] [address] [facility] [place], [place]  [address] Phone: [phone] Fax: [phone]

## 2017-03-16 NOTE — Discharge Instructions (Signed)

## 2017-03-16 NOTE — Interval H&P Note (Signed)
History and Physical Interval Note:  03/16/2017 7:33 AM  [name]  has presented today for cardiac cath with the diagnosis of CAD/unstable angina.  The various methods of treatment have been discussed with the patient and family. After consideration of risks, benefits and other options for treatment, the patient has consented to  Procedure(s): LEFT HEART CATH AND CORONARY ANGIOGRAPHY (N/A) as a surgical intervention .  The patient's history has been reviewed, patient examined, no change in status, stable for surgery.  I have reviewed the patient's chart and labs.  Questions were answered to the patient's satisfaction.    Cath Lab Visit (complete for each Cath Lab visit)  Clinical Evaluation Leading to the Procedure:   ACS: No.  Non-ACS:    Anginal Classification: CCS III  Anti-ischemic medical therapy: No Therapy  Non-Invasive Test Results: Low-risk stress test findings: cardiac mortality <1%/year  Prior CABG: No previous CABG         Lauree Chandler

## 2017-03-16 NOTE — Discharge Summary (Signed)
Discharge Summary    Patient ID: Casey Kirk,  MRN: [id number], DOB/AGE: 11/07/1945 71 y.o.  Admit date: 03/16/2017 Discharge date: 03/16/2017  Primary Care Provider: Lawerance Cruel Primary Cardiologist:Dr. Marlou Porch  Discharge Diagnoses    Active Problems:   Coronary artery disease involving native coronary artery of native heart with unstable angina pectoris (HCC)  COPD  HLD   IBS  Allergies Allergies  Allergen Reactions  . Hydrocodone-Acetaminophen Itching and Swelling  . Latex Itching and Swelling  . Pneumococcal Polysaccharide Vaccine Other (See Comments)    other    Diagnostic Studies/Procedures    CORONARY STENT INTERVENTION  INTRAVASCULAR PRESSURE WIRE/FFR STUDY  LEFT HEART CATH AND CORONARY ANGIOGRAPHY  Conclusion     Mid RCA lesion, 10 %stenosed.  A STENT PROMUS PREM MR 2.75X16 drug eluting stent was successfully placed.  Mid LAD lesion, 80 %stenosed.  Post intervention, there is a 0% residual stenosis.  The left ventricular systolic function is normal.  LV end diastolic pressure is normal.  The left ventricular ejection fraction is greater than 65% by visual estimate.  There is no mitral valve regurgitation.   1. Severe single vessel CAD 2. Severe stenosis mid LAD with FFR of 0.70. 3. Successful PTCA/DES x 1 mid LAD 4. Mild plaque RCA 5. Normal LV systolic function  Recommendations: Will continue DAPT with ASA and Plavix for one year. Start statin. Same day PCI discharge.      History of Present Illness     Casey Kirk is a 71 y.o. Female with hx of long standing smoking with COPD, bipolar disorder, GERD, IBS, HLD, thyroid disease and coronary calcification of CT of chest presented for outpatient cath.   Low risk Myoview on 08/2016.  Referred back here from her PCP - Dr. Harrington Challenger - for re-evaluation for chest pain/shortness of breath.  She continues to have  "chest soreness" - it is worse with exertion. Has had with trying to  exercise as well as with doing housework. She remains short of breath. Her energy level is reduced. She has had another CT scan back in August - continuing to show atherosclerosis - now in the aortic arch as well as in the RCA. Asking "what to do to stop this". She continues to smoke. She is just on low dose statin - has not had repeat labs   Hospital Course     Consultants: None  1. CAD - Cath showed 80% mLAD stenosis with FFR of 0.70. S/p PTCA & DES x 1. No post procedure complications. Felt stable for same day PCI discharge. Given 30 days supply of plavix.   2. HLD - 03/12/2017: Cholesterol, Total 121; HDL 42; LDL Calculated 61; Triglycerides 92  - Continue statin  The patient has been seen by Dr. Angelena Form  today and deemed ready for discharge home. All follow-up appointments have been scheduled. Discharge medications are listed below.    Discharge Vitals Blood pressure (!) 112/53, pulse 65, temperature 98.6 F (37 C), temperature source Oral, resp. rate 17, height 5\' 3"  (1.6 m), weight 140 lb (63.5 kg), SpO2 99 %.  Filed Weights   03/16/17 0540  Weight: 140 lb (63.5 kg)    Labs & Radiologic Studies     No results found.  Disposition   Pt is being discharged home today in good condition.  Follow-up Plans & Appointments    Follow-up Information    Consuelo Pandy, PA-C. Go on 03/29/2017.   Specialties:  Cardiology, Radiology  Why:  @9 :15 for hosptial followup Contact information: [address] [place] [address] [phone]          Discharge Instructions    Amb Referral to Cardiac Rehabilitation    Complete by:  As directed    Sending letter of interest to [place] also at pt's request   Diagnosis:  Coronary Stents   Diet - low sodium heart healthy    Complete by:  As directed    Discharge instructions    Complete by:  As directed    No driving for 48. No lifting over 5 lbs for 1 week. No sexual activity for 1 week. You may return to  work on 03/21/17 Keep procedure site clean & dry. If you notice increased pain, swelling, bleeding or pus, call/return!  You may shower, but no soaking baths/hot tubs/pools for 1 week.  Some studies suggest Prilosec/Omeprazole interacts with Plavix. We changed your Prilosec/Omeprazole to Protonix for less chance of interaction.   Increase activity slowly    Complete by:  As directed       Discharge Medications   Current Discharge Medication List    START taking these medications   Details  clopidogrel (PLAVIX) 75 MG tablet Take 1 tablet (75 mg total) by mouth daily with breakfast. Qty: 30 tablet, Refills: 11    nitroGLYCERIN (NITROSTAT) 0.4 MG SL tablet Place 1 tablet (0.4 mg total) under the tongue every 5 (five) minutes as needed for chest pain. Qty: 25 tablet, Refills: 12    pantoprazole (PROTONIX) 40 MG tablet Take 1 tablet (40 mg total) by mouth daily. Qty: 30 tablet, Refills: 6      CONTINUE these medications which have NOT CHANGED   Details  albuterol (PROVENTIL HFA;VENTOLIN HFA) 108 (90 Base) MCG/ACT inhaler Inhale 2 puffs into the lungs every 6 (six) hours as needed for wheezing or shortness of breath. Qty: 1 Inhaler, Refills: 5    aspirin 81 MG tablet Take 81 mg by mouth daily.      calcium citrate-vitamin D (CITRACAL+D) 315-200 MG-UNIT per tablet Take 1 tablet by mouth daily.     CALCIUM/MAGNESIUM/ZINC FORMULA PO Take 1 tablet by mouth 3 (three) times a week.    cholecalciferol (VITAMIN D) 1000 units tablet Take 1,000 Units by mouth daily.    diphenhydrAMINE (BENADRYL) 25 mg capsule Take 25 mg by mouth daily as needed for allergies.    fish oil-omega-3 fatty acids 1000 MG capsule Take 1 g by mouth 2 (two) times a week.     fluticasone (FLONASE) 50 MCG/ACT nasal spray Place 2 sprays into both nostrils daily as needed for allergies or rhinitis.    Melatonin 1 MG TABS Take 3 mg by mouth at bedtime.     Multiple Vitamin (MULTIVITAMIN) tablet Take 1 tablet by mouth  daily.      simvastatin (ZOCOR) 20 MG tablet Take 20 mg by mouth every evening.     traZODone (DESYREL) 50 MG tablet Take 50 mg by mouth at bedtime.    vitamin E 100 UNIT capsule Take 100 Units by mouth daily.     varenicline (CHANTIX STARTING MONTH PAK) 0.5 MG X 11 & 1 MG X 42 tablet Take one 0.5 mg tablet by mouth once daily for 3 days, then increase to one 0.5 mg tablet twice daily for 4 days, then increase to one 1 mg tablet twice daily. Qty: 53 tablet, Refills: 0      STOP taking these medications  omeprazole (PRILOSEC) 40 MG capsule          Outstanding Labs/Studies   None  Duration of Discharge Encounter   Greater than 30 minutes including physician time.  Signed, Bhagat,Bhavinkumar PA-C 03/16/2017, 2:46 PM   Same day discharge following PCI. She was not admitted.   Lauree Chandler 03/16/2017 3:28 PM

## 2017-03-19 ENCOUNTER — Telehealth (HOSPITAL_COMMUNITY): Payer: Self-pay

## 2017-03-19 ENCOUNTER — Encounter (HOSPITAL_COMMUNITY): Payer: Self-pay | Admitting: Cardiovascular Disease

## 2017-03-19 ENCOUNTER — Telehealth: Payer: Self-pay | Admitting: Cardiology

## 2017-03-19 MED FILL — Lidocaine HCl Local Inj 2%: INTRAMUSCULAR | Qty: 20 | Status: AC

## 2017-03-19 NOTE — Telephone Encounter (Signed)
Patients insurance is active and benefits verified with [employer] - $15.00 co-pay, no deductible, out of pocket amount is $3,400/$388.07 has been met, no co-insurance, and no pre-authorization is required. Reference #[id number] °

## 2017-03-19 NOTE — Telephone Encounter (Signed)
Pt calling because she has noticed some soreness in between her shoulders with activity.  It is only on occasion and only with movement.  She is also c/o trouble sleeping last night, restless, and believes that may be a result of Chantix that she just started.  Advised restless sleep and abnormal dreams are side effects of this medication.  She will continue it for now.  She is not having any other s/s.  Reassurance given.  She will continue to monitor and c/b with further concerns.

## 2017-03-19 NOTE — Telephone Encounter (Signed)
Casey Kirk is calling because she is having some soreness. Had a Stent placed . Please call

## 2017-03-20 NOTE — Telephone Encounter (Signed)
Spoke with patient in regards to Cardiac Rehab - She would like to give [place] a call after her appointment next week to decide if she wants to attend program or not.

## 2017-03-27 DIAGNOSIS — I700 Atherosclerosis of aorta: Secondary | ICD-10-CM | POA: Diagnosis not present

## 2017-03-27 DIAGNOSIS — Z1389 Encounter for screening for other disorder: Secondary | ICD-10-CM | POA: Diagnosis not present

## 2017-03-27 DIAGNOSIS — I2510 Atherosclerotic heart disease of native coronary artery without angina pectoris: Secondary | ICD-10-CM | POA: Diagnosis not present

## 2017-03-27 DIAGNOSIS — Z0000 Encounter for general adult medical examination without abnormal findings: Secondary | ICD-10-CM | POA: Diagnosis not present

## 2017-03-27 DIAGNOSIS — Z79899 Other long term (current) drug therapy: Secondary | ICD-10-CM | POA: Diagnosis not present

## 2017-03-27 DIAGNOSIS — J449 Chronic obstructive pulmonary disease, unspecified: Secondary | ICD-10-CM | POA: Diagnosis not present

## 2017-03-27 DIAGNOSIS — M810 Age-related osteoporosis without current pathological fracture: Secondary | ICD-10-CM | POA: Diagnosis not present

## 2017-03-27 DIAGNOSIS — F3181 Bipolar II disorder: Secondary | ICD-10-CM | POA: Diagnosis not present

## 2017-03-27 DIAGNOSIS — K219 Gastro-esophageal reflux disease without esophagitis: Secondary | ICD-10-CM | POA: Diagnosis not present

## 2017-03-27 DIAGNOSIS — M79606 Pain in leg, unspecified: Secondary | ICD-10-CM | POA: Diagnosis not present

## 2017-03-27 DIAGNOSIS — E782 Mixed hyperlipidemia: Secondary | ICD-10-CM | POA: Diagnosis not present

## 2017-03-29 ENCOUNTER — Ambulatory Visit: Payer: PPO | Admitting: Cardiology

## 2017-03-29 ENCOUNTER — Other Ambulatory Visit: Payer: Self-pay | Admitting: Family Medicine

## 2017-03-29 ENCOUNTER — Encounter (INDEPENDENT_AMBULATORY_CARE_PROVIDER_SITE_OTHER): Payer: Self-pay

## 2017-03-29 ENCOUNTER — Encounter: Payer: Self-pay | Admitting: Cardiology

## 2017-03-29 VITALS — BP 124/80 | HR 70 | Ht 63.0 in | Wt 140.4 lb

## 2017-03-29 DIAGNOSIS — I25110 Atherosclerotic heart disease of native coronary artery with unstable angina pectoris: Secondary | ICD-10-CM

## 2017-03-29 DIAGNOSIS — I2584 Coronary atherosclerosis due to calcified coronary lesion: Secondary | ICD-10-CM | POA: Diagnosis not present

## 2017-03-29 DIAGNOSIS — I700 Atherosclerosis of aorta: Secondary | ICD-10-CM

## 2017-03-29 DIAGNOSIS — Z720 Tobacco use: Secondary | ICD-10-CM

## 2017-03-29 DIAGNOSIS — I2510 Atherosclerotic heart disease of native coronary artery without angina pectoris: Secondary | ICD-10-CM

## 2017-03-29 DIAGNOSIS — F17200 Nicotine dependence, unspecified, uncomplicated: Secondary | ICD-10-CM

## 2017-03-29 MED ORDER — CLOPIDOGREL BISULFATE 75 MG PO TABS: 75.0000 mg | ORAL_TABLET | Freq: Every day | ORAL | 3 refills | Status: DC

## 2017-03-29 NOTE — Progress Notes (Signed)
03/29/2017 [place]   Feb 06, 1946  [id number]  Primary Physician Lawerance Cruel, MD Primary Cardiologist: Dr. Marlou Porch   Reason for Visit/CC: [facility] f/u for CAD s/p PCI to LAD  HPI: Casey Kirk a 71 y.o. Female with hx of long standing smoking with COPD, bipolar disorder, GERD, IBS, HLD, and thyroid disease. She was recently found to have coronary calcifications on chest CT, along with new symptoms of fatigue and CP. She was referred for [facility] 03/16/17. Cath showed 80% mLAD stenosis with FFR of 0.70. She underwent PTCA & DES x 1. No post procedure complications. She was felt stable for same day PCI discharge.   She presents back for post hospital f/u. She denies any CP or dyspnea. Right radial cath site is stable. BP is well controlled. She reports full med compliance with ASA and Plavix. EKG shows NSR HR 72 bpm. BP is well controlled at 124/80.  She continues to smoke but has started to use Chantix, which she reports is helping. She also notes bilateral upper leg pain, worse with ambulation. No resting leg pain.    No outpatient medications have been marked as taking for the 03/29/17 encounter (Office Visit) with Consuelo Pandy, PA-C.   Allergies  Allergen Reactions  . Hydrocodone-Acetaminophen Itching and Swelling  . Latex Itching and Swelling  . Pneumococcal Polysaccharide Vaccine Other (See Comments)    other   Past Medical History:  Diagnosis Date  . Allergic rhinitis   . ARTHRITIS, HX OF 01/09/2007  . BIPOLAR II DISORDER 03/17/2008  . 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 COPD 12/21/2006  . GERD (gastroesophageal reflux disease)   . GOUT 01/09/2007  . Hx of colonic polyps   . IBS (irritable bowel syndrome)   . Osteoporosis   . Sinusitis   . Thyroid disease   . TINEA CORPORIS 10/06/2009   Family History  Problem Relation Age of Onset  . Alcohol abuse Other   . Arthritis Other   . Diabetes Other   . Hyperlipidemia Other   . Hypertension Other   .  Cancer Other        ovarian, uterine  . Heart disease Other   . Sudden death Other   . Colon polyps Mother   . Tremor Mother   . COPD Mother   . Colon polyps Brother   . Heart attack Brother 50  . Colon cancer Neg Hx    Past Surgical History:  Procedure Laterality Date  . ABDOMINAL HYSTERECTOMY     CA cells removed end of vagina  . BREAST SURGERY     biopsy  . vaginal carcinoma surgery     Social History   Socioeconomic History  . Marital status: Married    Spouse name: Not on file  . Number of children: 2  . Years of education: Not on file  . Highest education level: Not on file  Social Needs  . Financial resource strain: Not on file  . Food insecurity - worry: Not on file  . Food insecurity - inability: Not on file  . Transportation needs - medical: Not on file  . Transportation needs - non-medical: Not on file  Occupational History  . Occupation: retired  Tobacco Use  . Smoking status: Current Every Day Smoker    Packs/day: 0.50    Years: 40.00    Pack years: 20.00    Types: Cigarettes  . Smokeless tobacco: Never Used  . Tobacco comment: Pt states that  she is currently smoking about 5cigarettes a day now.  Substance and Sexual Activity  . Alcohol use: Yes    Comment: Rarely   . Drug use: No  . Sexual activity: Yes  Other Topics Concern  . Not on file  Social History Narrative   Lives alone.  She has two grown children.   She is retired from working at [employer].   Highest level of education:  2 year business college     Review of Systems: General: negative for chills, fever, night sweats or weight changes.  Cardiovascular: negative for chest pain, dyspnea on exertion, edema, orthopnea, palpitations, paroxysmal nocturnal dyspnea or shortness of breath Dermatological: negative for rash Respiratory: negative for cough or wheezing Urologic: negative for hematuria Abdominal: negative for nausea, vomiting, diarrhea, bright red blood per rectum, melena, or  hematemesis Neurologic: negative for visual changes, syncope, or dizziness All other systems reviewed and are otherwise negative except as noted above.   Physical Exam:  Blood pressure 124/80, pulse 70, height 5\' 3"  (1.6 m), weight 140 lb 6.4 oz (63.7 kg), SpO2 99 %.  General appearance: alert, cooperative and no distress Neck: no carotid bruit and no JVD Lungs: clear to auscultation bilaterally Heart: regular rate and rhythm, S1, S2 normal, no murmur, click, rub or gallop Extremities: extremities normal, atraumatic, no cyanosis or edema Pulses: decreased distal pulses bilaterally Skin: Skin color, texture, turgor normal. No rashes or lesions Neurologic: Grossly normal  EKG NSR 72 bpm  -- personally reviewed   ASSESSMENT AND PLAN:   1. CAD: recent cath 03/16/2017 showed severe single vessel CAD w/ 80 % mid LAD lesion, treated with PCI + DES. LVEF normal. She is stable post cath w/o any recurrent CP or dyspnea. Continue DAPT with ASA + Plavix for a minimum of 12 months. Continue statin therapy with Crestor. We will refer to cardiac rehab.   2. Tobacco Abuse: smoking cessation encouraged. Pt has started Chantix.   3. DLD: LDL was at goal at 61 mg/dL. However given CAD, her statin was recently changed from simvastatin to Crestor 20 mg. She is tolerating this well. We will recheck FLP and HFTs in 6-8 weeks.   4. Bilateral Leg Pain: ? Claudication. She has risk factors for PAD including known coronary disease and 50 + year h/o tobacco abuse. She has decreased distal pulses bilaterally. We will obtain bilateral LE arterial dopplers. If findings are suggestive of PAD, will refer to Dr. Gwenlyn Found or Dr. Fletcher Anon.    Follow-Up w/ Dr. Marlou Porch in 3 months.   Casey Kirk, MHS [facility] 03/29/2017 9:40 AM

## 2017-03-29 NOTE — Telephone Encounter (Signed)
Attempted to call and speak with patient in regards to Cardiac Rehab - Lm on Vm

## 2017-03-29 NOTE — Patient Instructions (Signed)
Your physician recommends that you continue on your current medications as directed. Please refer to the Current Medication list given to you today.  Your physician recommends that you return for lab work in:  2 - [facility] has requested that you have a lower or upper extremity arterial duplex. This test is an ultrasound of the arteries in the legs or arms. It looks at arterial blood flow in the legs and arms. Allow one hour for Lower and Upper Arterial scans. There are no restrictions or special instructions  Your physician recommends that you schedule a follow-up appointment in: [place]

## 2017-04-02 NOTE — Telephone Encounter (Signed)
Patient called and scheduled for Cardiac Rehab - Scheduled orientation on 05/03/17 at 8:45am. Patient will attend the 9:45am exc class.

## 2017-04-05 ENCOUNTER — Other Ambulatory Visit: Payer: Self-pay

## 2017-04-05 ENCOUNTER — Other Ambulatory Visit: Payer: Self-pay | Admitting: Family Medicine

## 2017-04-05 DIAGNOSIS — I2510 Atherosclerotic heart disease of native coronary artery without angina pectoris: Secondary | ICD-10-CM

## 2017-04-17 ENCOUNTER — Ambulatory Visit
Admission: RE | Admit: 2017-04-17 | Discharge: 2017-04-17 | Disposition: A | Discharge status: Home / Self Care | Payer: PPO | Source: Ambulatory Visit | Attending: Family Medicine | Admitting: Family Medicine

## 2017-04-17 DIAGNOSIS — I2510 Atherosclerotic heart disease of native coronary artery without angina pectoris: Secondary | ICD-10-CM

## 2017-04-17 DIAGNOSIS — I6523 Occlusion and stenosis of bilateral carotid arteries: Secondary | ICD-10-CM | POA: Diagnosis not present

## 2017-04-23 DIAGNOSIS — M7660 Achilles tendinitis, unspecified leg: Secondary | ICD-10-CM | POA: Diagnosis not present

## 2017-04-23 DIAGNOSIS — I779 Disorder of arteries and arterioles, unspecified: Secondary | ICD-10-CM | POA: Diagnosis not present

## 2017-04-24 ENCOUNTER — Telehealth (HOSPITAL_COMMUNITY): Payer: Self-pay | Admitting: Pharmacist

## 2017-04-25 ENCOUNTER — Other Ambulatory Visit: Payer: Self-pay | Admitting: Cardiology

## 2017-04-25 DIAGNOSIS — Z720 Tobacco use: Secondary | ICD-10-CM

## 2017-05-01 ENCOUNTER — Encounter (HOSPITAL_COMMUNITY): Payer: PPO

## 2017-05-03 ENCOUNTER — Encounter (HOSPITAL_COMMUNITY): Payer: Self-pay

## 2017-05-03 ENCOUNTER — Encounter (HOSPITAL_COMMUNITY)
Admission: RE | Admit: 2017-05-03 | Discharge: 2017-05-03 | Disposition: A | Discharge status: Home / Self Care | Payer: PPO | Source: Ambulatory Visit | Attending: Cardiology | Admitting: Cardiology

## 2017-05-03 VITALS — Ht 63.0 in | Wt 143.3 lb

## 2017-05-03 DIAGNOSIS — F319 Bipolar disorder, unspecified: Secondary | ICD-10-CM | POA: Insufficient documentation

## 2017-05-03 DIAGNOSIS — K219 Gastro-esophageal reflux disease without esophagitis: Secondary | ICD-10-CM | POA: Diagnosis not present

## 2017-05-03 DIAGNOSIS — M109 Gout, unspecified: Secondary | ICD-10-CM | POA: Insufficient documentation

## 2017-05-03 DIAGNOSIS — Z7902 Long term (current) use of antithrombotics/antiplatelets: Secondary | ICD-10-CM | POA: Insufficient documentation

## 2017-05-03 DIAGNOSIS — M810 Age-related osteoporosis without current pathological fracture: Secondary | ICD-10-CM | POA: Diagnosis not present

## 2017-05-03 DIAGNOSIS — F17210 Nicotine dependence, cigarettes, uncomplicated: Secondary | ICD-10-CM | POA: Diagnosis not present

## 2017-05-03 DIAGNOSIS — Z955 Presence of coronary angioplasty implant and graft: Secondary | ICD-10-CM | POA: Insufficient documentation

## 2017-05-03 DIAGNOSIS — J449 Chronic obstructive pulmonary disease, unspecified: Secondary | ICD-10-CM | POA: Insufficient documentation

## 2017-05-03 DIAGNOSIS — M1990 Unspecified osteoarthritis, unspecified site: Secondary | ICD-10-CM | POA: Insufficient documentation

## 2017-05-03 DIAGNOSIS — Z7951 Long term (current) use of inhaled steroids: Secondary | ICD-10-CM | POA: Diagnosis not present

## 2017-05-03 DIAGNOSIS — Z79899 Other long term (current) drug therapy: Secondary | ICD-10-CM | POA: Insufficient documentation

## 2017-05-03 DIAGNOSIS — E079 Disorder of thyroid, unspecified: Secondary | ICD-10-CM | POA: Diagnosis not present

## 2017-05-03 DIAGNOSIS — Z7982 Long term (current) use of aspirin: Secondary | ICD-10-CM | POA: Insufficient documentation

## 2017-05-03 DIAGNOSIS — K589 Irritable bowel syndrome without diarrhea: Secondary | ICD-10-CM | POA: Diagnosis not present

## 2017-05-03 DIAGNOSIS — Z86010 Personal history of colonic polyps: Secondary | ICD-10-CM | POA: Diagnosis not present

## 2017-05-03 NOTE — Progress Notes (Signed)
Casey Kirk 71 y.o. female DOB: 1945/12/16 MRN: [id number]      Nutrition Note  Dx: s/p DES LAD Past Medical History:  Diagnosis Date  . Allergic rhinitis   . ARTHRITIS, HX OF 01/09/2007  . BIPOLAR II DISORDER 03/17/2008  . 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 COPD 12/21/2006  . GERD (gastroesophageal reflux disease)   . GOUT 01/09/2007  . Hx of colonic polyps   . IBS (irritable bowel syndrome)   . Osteoporosis   . Sinusitis   . Thyroid disease   . TINEA CORPORIS 10/06/2009   Meds reviewed.  HT: Ht Readings from Last 1 Encounters:  03/29/17 5\' 3"  (1.6 m)    WT: Wt Readings from Last 3 Encounters:  03/29/17 140 lb 6.4 oz (63.7 kg)  03/16/17 140 lb (63.5 kg)  03/12/17 140 lb 12.8 oz (63.9 kg)     BMI 24.87   Current tobacco use? Yes Pt taking Chantix to try to quit tobacco use.   Labs:  Lipid Panel     Component Value Date/Time   CHOL 121 03/12/2017 1010   TRIG 92 03/12/2017 1010   HDL 42 03/12/2017 1010   CHOLHDL 2.9 03/12/2017 1010   CHOLHDL 5 09/22/2010 0843   VLDL 20.6 09/22/2010 0843   LDLCALC 61 03/12/2017 1010    No results found for: HGBA1C CBG (last 3)  No results for input(s): GLUCAP in the last 72 hours.  Nutrition Note Spoke with pt. Nutrition plan and goals reviewed with pt. Pt wants to lose 20 lb. Pt encouraged to focus on tobacco cessation first and then tackle wt loss. Wt loss tips reviewed.  Pt expressed understanding of the information reviewed. Pt aware of nutrition education classes offered.  Nutrition Diagnosis ? Food-and nutrition-related knowledge deficit related to lack of exposure to information as related to diagnosis of: ? CVD   Nutrition Intervention ? Pt's individual nutrition plan and goals reviewed with pt. ? Pt given handouts for: ? Nutrition I class ? Nutrition II class   Nutrition Goal(s):  ? Pt to identify and limit food sources of saturated fat, trans fat, and sodium ? Pt to identify food quantities necessary to achieve  long-term weight loss of 6-20 lb. Goal wt of 120 lb desired.  ? Pt to decrease consumption of regular Coke and sweet tea.   Plan:  Pt to attend nutrition classes ? Nutrition I ? Nutrition II ? Portion Distortion  Will provide client-centered nutrition education as part of interdisciplinary care.   Monitor and evaluate progress toward nutrition goal with team.  Derek Mound, M.Ed, RD, LDN, CDE 05/03/2017 9:35 AM

## 2017-05-03 NOTE — Progress Notes (Signed)
Cardiac Individual Treatment Plan  Patient Details  Name: Casey Kirk MRN: [id number] Date of Birth: 1946-04-12 Referring Provider:     CARDIAC REHAB Casey Kirk from 05/03/2017 in [facility]  Referring Provider  Casey Pain MD      Initial Encounter Date:    CARDIAC REHAB PHASE II ORIENTATION from 05/03/2017 in [facility]  Date  05/03/17  Referring Provider  Casey Pain MD      Visit Diagnosis: Status post coronary artery stent placement  Patient's Home Medications on Admission:  Current Outpatient Medications:  .  albuterol (PROVENTIL HFA;VENTOLIN HFA) 108 (90 Base) MCG/ACT inhaler, Inhale 2 puffs into the lungs every 6 (six) hours as needed for wheezing or shortness of breath., Disp: 1 Inhaler, Rfl: 5 .  aspirin 81 MG tablet, Take 81 mg by mouth daily.  , Disp: , Rfl:  .  calcium citrate-vitamin D (CITRACAL+D) 315-200 MG-UNIT per tablet, Take 1 tablet by mouth daily. , Disp: , Rfl:  .  CALCIUM/MAGNESIUM/ZINC FORMULA PO, Take 1 tablet by mouth 3 (three) times a week., Disp: , Rfl:  .  cholecalciferol (VITAMIN D) 1000 units tablet, Take 1,000 Units by mouth daily., Disp: , Rfl:  .  clopidogrel (PLAVIX) 75 MG tablet, Take 1 tablet (75 mg total) daily by mouth., Disp: 90 tablet, Rfl: 3 .  diphenhydrAMINE (BENADRYL) 25 mg capsule, Take 25 mg by mouth daily as needed for allergies., Disp: , Rfl:  .  fish oil-omega-3 fatty acids 1000 MG capsule, Take 1 g by mouth 2 (two) times a week. , Disp: , Rfl:  .  fluticasone (FLONASE) 50 MCG/ACT nasal spray, Place 2 sprays into both nostrils daily as needed for allergies or rhinitis., Disp: , Rfl:  .  Melatonin 1 MG TABS, Take 3 mg by mouth at bedtime. , Disp: , Rfl:  .  Multiple Vitamin (MULTIVITAMIN) tablet, Take 1 tablet by mouth daily.  , Disp: , Rfl:  .  nitroGLYCERIN (NITROSTAT) 0.4 MG SL tablet, Place 1 tablet (0.4 mg total) under the tongue every 5 (five)  minutes as needed for chest Kirk., Disp: 25 tablet, Rfl: 12 .  rosuvastatin (CRESTOR) 20 MG tablet, Take 1 tablet daily by mouth., Disp: , Rfl:  .  traZODone (DESYREL) 50 MG tablet, Take 50 mg by mouth at bedtime., Disp: , Rfl:  .  varenicline (CHANTIX STARTING MONTH PAK) 0.5 MG X 11 & 1 MG X 42 tablet, Take one 0.5 mg tablet by mouth once daily for 3 days, then increase to one 0.5 mg tablet twice daily for 4 days, then increase to one 1 mg tablet twice daily., Disp: 53 tablet, Rfl: 0 .  vitamin E 100 UNIT capsule, Take 100 Units by mouth daily. , Disp: , Rfl:  .  pantoprazole (PROTONIX) 40 MG tablet, Take 1 tablet (40 mg total) by mouth daily. (Patient not taking: Reported on 05/03/2017), Disp: 30 tablet, Rfl: 6  Past Medical History: Past Medical History:  Diagnosis Date  . Allergic rhinitis   . ARTHRITIS, HX OF 01/09/2007  . BIPOLAR II DISORDER 03/17/2008  . 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 COPD 12/21/2006  . GERD (gastroesophageal reflux disease)   . GOUT 01/09/2007  . Hx of colonic polyps   . IBS (irritable bowel syndrome)   . Osteoporosis   . Sinusitis   . Thyroid disease   . TINEA CORPORIS 10/06/2009    Tobacco Use: Social History  Tobacco Use  Smoking Status Current Every Day Smoker  . Packs/day: 0.50  . Years: 40.00  . Pack years: 20.00  . Types: Cigarettes  Smokeless Tobacco Never Used  Tobacco Comment   Pt states that she is currently smoking about 5cigarettes a day now.    Labs: Recent Review Flowsheet Data    Labs for ITP Cardiac and Pulmonary Rehab Latest Ref Rng & Units 04/25/2007 03/17/2008 10/01/2009 09/22/2010 03/12/2017   Cholestrol 100 - 199 mg/dL - 136 181 182 121   LDLCALC 0 - 99 mg/dL - 85 124(H) 125(H) 61   HDL >39 mg/dL - 31.9(L) 38.60(L) 36.80(L) 42   Trlycerides 0 - 149 mg/dL - 95 92.0 103.0 92   PHART - 7.436(H) - - - -   PCO2ART - 31.4(L) - - - -   HCO3 - 20.9 - - - -   TCO2 - 18.3 - - - -   ACIDBASEDEF - 2.1(H) - - - -   O2SAT - 98.7 - - -  -      Capillary Blood Glucose: No results found for: GLUCAP   Exercise Target Goals: Date: 05/03/17  Exercise Program Goal: Individual exercise prescription set with THRR, safety & activity barriers. Participant demonstrates ability to understand and report RPE using BORG scale, to self-measure pulse accurately, and to acknowledge the importance of the exercise prescription.  Exercise Prescription Goal: Starting with aerobic activity 30 plus minutes a day, 3 days per week for initial exercise prescription. Provide home exercise prescription and guidelines that participant acknowledges understanding prior to discharge.  Activity Barriers & Risk Stratification: Activity Barriers & Cardiac Risk Stratification - 05/03/17 1058      Activity Barriers & Cardiac Risk Stratification   Activity Barriers  Other (comment);Back Problems    Comments  Chronic Right Knee/Leg Kirk, Occasional Right Shoulder Kirk, Occasional Back Kirk    Cardiac Risk Stratification  High       6 Minute Walk: 6 Minute Walk    Row Name 05/03/17 1054         6 Minute Walk   Phase  Initial     Distance  1600 feet     Walk Time  6 minutes     # of Rest Breaks  0     MPH  3.03     METS  3.46     RPE  12     Perceived Dyspnea   1     VO2 Peak  12.11     Symptoms  Yes (comment)     Comments  Mild SOB +1      Resting HR  62 bpm     Resting BP  132/70     Resting Oxygen Saturation   99 %     Exercise Oxygen Saturation  during 6 min walk  97 %     Max Ex. HR  89 bpm     Max Ex. BP  140/80     2 Minute Post BP  130/70        Oxygen Initial Assessment:   Oxygen Re-Evaluation:   Oxygen Discharge (Final Oxygen Re-Evaluation):   Initial Exercise Prescription: Initial Exercise Prescription - 05/03/17 1100      Date of Initial Exercise RX and Referring Provider   Date  05/03/17    Referring Provider  Casey Pain MD      Recumbant Bike   Level  2    RPM  70    Casey Kirk  20    Minutes  10     METs  2.97      NuStep   Level  2 Simultaneous filing. User may not have seen previous data.    SPM  70    Minutes  10    METs  2.5 Simultaneous filing. User may not have seen previous data.      Track   Laps  12    Minutes  10    METs  3.07      Prescription Details   Frequency (times per week)  3x    Duration  Progress to 30 minutes of continuous aerobic without signs/symptoms of physical distress      Intensity   THRR 40-80% of Max Heartrate  60-121    Ratings of Perceived Exertion  11-15    Perceived Dyspnea  0-4      Progression   Progression  Continue progressive overload as per policy without signs/symptoms or physical distress.      Resistance Training   Training Prescription  Yes    Weight  2lbs    Reps  10-15       Perform Capillary Blood Glucose checks as needed.  Exercise Prescription Changes:   Exercise Comments:   Exercise Goals and Review: Exercise Goals    Row Name 05/03/17 1004             Exercise Goals   Increase Physical Activity  Yes       Intervention  Provide advice, education, support and counseling about physical activity/exercise needs.;Develop an individualized exercise prescription for aerobic and resistive training based on initial evaluation findings, risk stratification, comorbidities and participant's personal goals.       Expected Outcomes  Achievement of increased cardiorespiratory fitness and enhanced flexibility, muscular endurance and strength shown through measurements of functional capacity and personal statement of participant.       Increase Strength and Stamina  Yes       Intervention  Provide advice, education, support and counseling about physical activity/exercise needs.;Develop an individualized exercise prescription for aerobic and resistive training based on initial evaluation findings, risk stratification, comorbidities and participant's personal goals.       Expected Outcomes  Achievement of increased  cardiorespiratory fitness and enhanced flexibility, muscular endurance and strength shown through measurements of functional capacity and personal statement of participant.       Able to understand and use rate of perceived exertion (RPE) scale  Yes       Intervention  Provide education and explanation on how to use RPE scale       Expected Outcomes  Short Term: Able to use RPE daily in rehab to express subjective intensity level;Long Term:  Able to use RPE to guide intensity level when exercising independently       Knowledge and understanding of Target Heart Rate Range (THRR)  Yes       Intervention  Provide education and explanation of THRR including how the numbers were predicted and where they are located for reference       Expected Outcomes  Short Term: Able to state/look up THRR;Short Term: Able to use daily as guideline for intensity in rehab;Long Term: Able to use THRR to govern intensity when exercising independently       Able to check pulse independently  Yes       Intervention  Provide education and demonstration on how to check pulse in carotid and radial arteries.;Review the importance of being able  to check your own pulse for safety during independent exercise       Expected Outcomes  Short Term: Able to explain why pulse checking is important during independent exercise;Long Term: Able to check pulse independently and accurately       Understanding of Exercise Prescription  Yes       Intervention  Provide education, explanation, and written materials on patient's individual exercise prescription       Expected Outcomes  Short Term: Able to explain program exercise prescription;Long Term: Able to explain home exercise prescription to exercise independently          Exercise Goals Re-Evaluation :    Discharge Exercise Prescription (Final Exercise Prescription Changes):   Nutrition:  Target Goals: Understanding of nutrition guidelines, daily intake of sodium 1500mg ,  cholesterol 200mg , calories 30% from fat and 7% or less from saturated fats, daily to have 5 or more servings of fruits and vegetables.  Biometrics: Pre Biometrics - 05/03/17 1100      Pre Biometrics   Height  5\' 3"  (1.6 m)    Weight  143 lb 4.8 oz (65 kg)    Waist Circumference  33.5 inches    Hip Circumference  38 inches    Waist to Hip Ratio  0.88 %    BMI (Calculated)  25.39    Triceps Skinfold  18 mm    % Body Fat  35.1 %    Grip Strength  34 kg    Flexibility  16 in    Single Leg Stand  16 seconds        Nutrition Therapy Plan and Nutrition Goals:   Nutrition Discharge: Nutrition Scores:   Nutrition Goals Re-Evaluation:   Nutrition Goals Re-Evaluation:   Nutrition Goals Discharge (Final Nutrition Goals Re-Evaluation):   Psychosocial: Target Goals: Acknowledge presence or absence of significant depression and/or stress, maximize coping skills, provide positive support system. Participant is able to verbalize types and ability to use techniques and skills needed for reducing stress and depression.  Initial Review & Psychosocial Screening: Initial Psych Review & Screening - 05/03/17 1252      Initial Review   Current issues with  None Identified      Family Dynamics   Good Support System?  Yes recently married three years, feels supported by her husband      Barriers   Psychosocial barriers to participate in program  The patient should benefit from training in stress management and relaxation.      Screening Interventions   Interventions  Encouraged to exercise       Quality of Life Scores: Quality of Life - 05/03/17 1105      Quality of Life Scores   Health/Function Pre  24.77 %    Socioeconomic Pre  26.25 %    Psych/Spiritual Pre  25.71 %    Family Pre  20.3 %    GLOBAL Pre  24.56 %       PHQ-9: Recent Review Flowsheet Data    There is no flowsheet data to display.     Interpretation of Total Score  Total Score Depression Severity:  1-4  = Minimal depression, 5-9 = Mild depression, 10-14 = Moderate depression, 15-19 = Moderately severe depression, 20-27 = Severe depression   Psychosocial Evaluation and Intervention:   Psychosocial Re-Evaluation:   Psychosocial Discharge (Final Psychosocial Re-Evaluation):   Vocational Rehabilitation: Provide vocational rehab assistance to qualifying candidates.   Vocational Rehab Evaluation & Intervention: Vocational Rehab - 05/03/17 1287  Initial Vocational Rehab Evaluation & Intervention   Assessment shows need for Vocational Rehabilitation  No Pt is retired        Education: Education Goals: Education classes will be provided on a weekly basis, covering required topics. Participant will state understanding/return demonstration of topics presented.  Learning Barriers/Preferences: Learning Barriers/Preferences - 05/03/17 0936      Learning Barriers/Preferences   Learning Barriers  Sight    Learning Preferences  Skilled Demonstration;Verbal Instruction;Individual Instruction;Pictoral       Education Topics: Count Your Pulse:  -Group instruction provided by verbal instruction, demonstration, patient participation and written materials to support subject.  Instructors address importance of being able to find your pulse and how to count your pulse when at home without a heart monitor.  Patients get hands on experience counting their pulse with staff help and individually.   Heart Attack, Angina, and Risk Factor Modification:  -Group instruction provided by verbal instruction, video, and written materials to support subject.  Instructors address signs and symptoms of angina and heart attacks.    Also discuss risk factors for heart disease and how to make changes to improve heart health risk factors.   Functional Fitness:  -Group instruction provided by verbal instruction, demonstration, patient participation, and written materials to support subject.  Instructors address  safety measures for doing things around the house.  Discuss how to get up and down off the floor, how to pick things up properly, how to safely get out of a chair without assistance, and balance training.   Meditation and Mindfulness:  -Group instruction provided by verbal instruction, patient participation, and written materials to support subject.  Instructor addresses importance of mindfulness and meditation practice to help reduce stress and improve awareness.  Instructor also leads participants through a meditation exercise.    Stretching for Flexibility and Mobility:  -Group instruction provided by verbal instruction, patient participation, and written materials to support subject.  Instructors lead participants through series of stretches that are designed to increase flexibility thus improving mobility.  These stretches are additional exercise for major muscle groups that are typically performed during regular warm up and cool down.   Hands Only CPR:  -Group verbal, video, and participation provides a basic overview of AHA guidelines for community CPR. Role-play of emergencies allow participants the opportunity to practice calling for help and chest compression technique with discussion of AED use.   Hypertension: -Group verbal and written instruction that provides a basic overview of hypertension including the most recent diagnostic guidelines, risk factor reduction with self-care instructions and medication management.    Nutrition I class: Heart Healthy Eating:  -Group instruction provided by PowerPoint slides, verbal discussion, and written materials to support subject matter. The instructor gives an explanation and review of the Therapeutic Lifestyle Changes diet recommendations, which includes a discussion on lipid goals, dietary fat, sodium, fiber, plant stanol/sterol esters, sugar, and the components of a well-balanced, healthy diet.   Nutrition II class: Lifestyle Skills:   -Group instruction provided by PowerPoint slides, verbal discussion, and written materials to support subject matter. The instructor gives an explanation and review of label reading, grocery shopping for heart health, heart healthy recipe modifications, and ways to make healthier choices when eating out.   Diabetes Question & Answer:  -Group instruction provided by PowerPoint slides, verbal discussion, and written materials to support subject matter. The instructor gives an explanation and review of diabetes co-morbidities, pre- and post-prandial blood glucose goals, pre-exercise blood glucose goals, signs, symptoms, and treatment  of hypoglycemia and hyperglycemia, and foot care basics.   Diabetes Blitz:  -Group instruction provided by PowerPoint slides, verbal discussion, and written materials to support subject matter. The instructor gives an explanation and review of the physiology behind type 1 and type 2 diabetes, diabetes medications and rational behind using different medications, pre- and post-prandial blood glucose recommendations and Hemoglobin A1c goals, diabetes diet, and exercise including blood glucose guidelines for exercising safely.    Portion Distortion:  -Group instruction provided by PowerPoint slides, verbal discussion, written materials, and food models to support subject matter. The instructor gives an explanation of serving size versus portion size, changes in portions sizes over the last 20 years, and what consists of a serving from each food group.   Stress Management:  -Group instruction provided by verbal instruction, video, and written materials to support subject matter.  Instructors review role of stress in heart disease and how to cope with stress positively.     Exercising on Your Own:  -Group instruction provided by verbal instruction, power point, and written materials to support subject.  Instructors discuss benefits of exercise, components of exercise,  frequency and intensity of exercise, and end points for exercise.  Also discuss use of nitroglycerin and activating EMS.  Review options of places to exercise outside of rehab.  Review guidelines for sex with heart disease.   Cardiac Drugs I:  -Group instruction provided by verbal instruction and written materials to support subject.  Instructor reviews cardiac drug classes: antiplatelets, anticoagulants, beta blockers, and statins.  Instructor discusses reasons, side effects, and lifestyle considerations for each drug class.   Cardiac Drugs II:  -Group instruction provided by verbal instruction and written materials to support subject.  Instructor reviews cardiac drug classes: angiotensin converting enzyme inhibitors (ACE-I), angiotensin II receptor blockers (ARBs), nitrates, and calcium channel blockers.  Instructor discusses reasons, side effects, and lifestyle considerations for each drug class.   Anatomy and Physiology of the Circulatory System:  Group verbal and written instruction and models provide basic cardiac anatomy and physiology, with the coronary electrical and arterial systems. Review of: AMI, Angina, Valve disease, Heart Failure, Peripheral Artery Disease, Cardiac Arrhythmia, Pacemakers, and the ICD.   Other Education:  -Group or individual verbal, written, or video instructions that support the educational goals of the cardiac rehab program.   Knowledge Questionnaire Score: Knowledge Questionnaire Score - 05/03/17 1102      Knowledge Questionnaire Score   Pre Score  27/28       Core Components/Risk Factors/Patient Goals at Admission: Personal Goals and Risk Factors at Admission - 05/03/17 1138      Core Components/Risk Factors/Patient Goals on Admission   Tobacco Cessation  Yes    Number of packs per day  3-4 cigarettes a day    Intervention  Assist the participant in steps to quit. Provide individualized education and counseling about committing to Tobacco  Cessation, relapse prevention, and pharmacological support that can be provided by physician.;Advice worker, assist with locating and accessing local/national Quit Smoking programs, and support quit date choice.    Expected Outcomes  Short Term: Will demonstrate readiness to quit, by selecting a quit date.;Short Term: Will quit all tobacco product use, adhering to prevention of relapse plan.;Long Term: Complete abstinence from all tobacco products for at least 12 months from quit date.    Improve shortness of breath with ADL's  Yes    Intervention  Provide education, individualized exercise plan and daily activity instruction to help decrease symptoms of  SOB with activities of daily living.    Expected Outcomes  Short Term: Achieves a reduction of symptoms when performing activities of daily living.    Develop more efficient breathing techniques such as purse lipped breathing and diaphragmatic breathing; and practicing self-pacing with activity  Yes    Intervention  Provide education, demonstration and support about specific breathing techniuqes utilized for more efficient breathing. Include techniques such as pursed lipped breathing, diaphragmatic breathing and self-pacing activity.    Expected Outcomes  Short Term: Participant will be able to demonstrate and use breathing techniques as needed throughout daily activities.       Core Components/Risk Factors/Patient Goals Review:    Core Components/Risk Factors/Patient Goals at Discharge (Final Review):    ITP Comments: ITP Comments    Row Name 05/03/17 0935 05/03/17 1136         ITP Comments  Dr. Fransico Him  Dr. Fransico Him, Medical Director         Comments:  Patient attended orientation from 0830 to 1030 to review rules and guidelines for program. Completed 6 minute walk test, Intitial ITP, and exercise prescription.  VSS. Telemetry-Sr with rare PVC.  Pt tolerated well with no complaints.  Brief psychosocial  assessment reveals no immediate barriers to participating in cardiac rehab. Pt recently married three years ago.  Pt feels supported in her rehab participation.  Pt desires to quit smoking.  Pt given 1800 quit now handout.  Pt remarked that she only had one cigarette this morning.  Pt is also taking chantix,  Pt is eager to learn more about heart healthy living.  Pt plans to attend two times a week for six weeks due to copay. Cherre Huger, BSN Cardiac and Training and development officer

## 2017-05-03 NOTE — Progress Notes (Signed)
Cardiac Rehab Medication Review by a Pharmacist  Does the patient  feel that his/her medications are working for him/her?  Yes  Has the patient been experiencing any side effects to the medications prescribed?  NO  Does the patient measure his/her own blood pressure or blood glucose at home?  NO  Does the patient have any problems obtaining medications due to transportation or finances?  NO  Understanding of regimen: excellent Understanding of indications: excelllent Potential of compliance: excellent    Casey Kirk Ruben Im RN 05/03/2017 9:34 AM

## 2017-05-05 ENCOUNTER — Other Ambulatory Visit: Payer: Self-pay | Admitting: Cardiology

## 2017-05-07 ENCOUNTER — Encounter (HOSPITAL_COMMUNITY)
Admission: RE | Admit: 2017-05-07 | Discharge: 2017-05-07 | Disposition: A | Discharge status: Home / Self Care | Payer: PPO | Source: Ambulatory Visit | Attending: Cardiology | Admitting: Cardiology

## 2017-05-07 DIAGNOSIS — Z955 Presence of coronary angioplasty implant and graft: Secondary | ICD-10-CM | POA: Diagnosis not present

## 2017-05-07 NOTE — Telephone Encounter (Signed)
This is not my patient. She sees Cecille Rubin or Dr. Marlou Porch. I will not refill.  Hyrum Shaneyfelt [place] MD, [facility]

## 2017-05-07 NOTE — Progress Notes (Signed)
Daily Session Note  Patient Details  Name: Casey Kirk MRN: [id number] Date of Birth: 26-Jul-1945 Referring Provider:     CARDIAC REHAB PHASE II ORIENTATION from 05/03/2017 in [place]  Referring Provider  Jerline Pain MD      Encounter Date: 05/07/2017  Check In: Session Check In - 05/07/17 1043      Check-In   Location  [facility]    Staff Present  Dorma Russell, MS,ACSM CEP, Exercise Physiologist;Portia Rollene Rotunda, RN, Deland Pretty, MS, ACSM CEP, Exercise Physiologist;Maria Venetia Maxon, RN, BSN    Supervising physician immediately available to respond to emergencies  [facility] immediately available    Physician(s)  Dr. Eliseo Squires    Medication changes reported      No    Fall or balance concerns reported     No    Tobacco Cessation  No Change    Warm-up and Cool-down  Performed as group-led instruction    Resistance Training Performed  Yes    VAD Patient?  No      Pain Assessment   Currently in Pain?  No/denies    Multiple Pain Sites  No       Capillary Blood Glucose: No results found for this or any previous visit (from the past 24 hour(s)).  Exercise Prescription Changes - 05/07/17 1100      Response to Exercise   Blood Pressure (Admit)  106/60    Blood Pressure (Exercise)  138/82    Blood Pressure (Exit)  108/70    Heart Rate (Admit)  67 bpm    Heart Rate (Exercise)  102 bpm    Heart Rate (Exit)  79 bpm    Rating of Perceived Exertion (Exercise)  12    Symptoms  none    Duration  Progress to 30 minutes of  aerobic without signs/symptoms of physical distress    Intensity  THRR unchanged      Progression   Progression  Continue to progress workloads to maintain intensity without signs/symptoms of physical distress.    Average METs  3.3      Resistance Training   Training Prescription  Yes    Weight  2lbs    Reps  10-15    Time  10 Minutes      Interval Training   Interval Training  No      Recumbant Bike   Level  2    Watts  31    Minutes  10    METs  3.42      NuStep   Level  2    SPM  80    Minutes  10    METs  3.2      Track   Laps  13    Minutes  10    METs  3.26       Social History   Tobacco Use  Smoking Status Current Every Day Smoker  . Packs/day: 0.50  . Years: 40.00  . Pack years: 20.00  . Types: Cigarettes  Smokeless Tobacco Never Used  Tobacco Comment   Pt states that she is currently smoking about 5cigarettes a day now.    Goals Met:  Exercise tolerated well  Goals Unmet:  Not Applicable  Comments: Chevelle started cardiac rehab today.  [name] tolerated light exercise without difficulty. VSS, telemetry-[facility] Rhtyhm, asymptomatic.  Medication list reconciled. Pt denies barriers to medicaiton compliance.  PSYCHOSOCIAL ASSESSMENT:  PHQ-0. Pt exhibits positive coping skills, hopeful  outlook with supportive family. No psychosocial needs identified at this time, no psychosocial interventions necessary.    Pt enjoys swimming ,dancing and spending time on [place].   Pt oriented to exercise equipment and routine.    Understanding verbalized. [name] is still smoking 4-7 cigarettes a day. Has smoking cessation information. I also told Clarisse about the smoking cessation classes at [place] only plans to attend for 6 weeks due to her Co Pay.Barnet Pall, RN,BSN 05/07/2017 1:42 PM Dr. Fransico Him is Medical Director for Cardiac Rehab at [facility].

## 2017-05-07 NOTE — Telephone Encounter (Signed)
Pt's pharmacy requesting a refill on chantix. Would you like to refill this medication? Please advise

## 2017-05-09 ENCOUNTER — Ambulatory Visit (HOSPITAL_COMMUNITY)
Admission: RE | Admit: 2017-05-09 | Discharge: 2017-05-09 | Disposition: A | Discharge status: Home / Self Care | Payer: PPO | Source: Ambulatory Visit | Attending: Cardiovascular Disease | Admitting: Cardiovascular Disease

## 2017-05-09 ENCOUNTER — Encounter (HOSPITAL_COMMUNITY)
Admission: RE | Admit: 2017-05-09 | Discharge: 2017-05-09 | Disposition: A | Discharge status: Home / Self Care | Payer: PPO | Source: Ambulatory Visit | Attending: Cardiology | Admitting: Cardiology

## 2017-05-09 DIAGNOSIS — Z720 Tobacco use: Secondary | ICD-10-CM | POA: Diagnosis not present

## 2017-05-09 DIAGNOSIS — Z955 Presence of coronary angioplasty implant and graft: Secondary | ICD-10-CM | POA: Diagnosis not present

## 2017-05-10 ENCOUNTER — Other Ambulatory Visit: Payer: PPO | Admitting: *Deleted

## 2017-05-10 DIAGNOSIS — I25110 Atherosclerotic heart disease of native coronary artery with unstable angina pectoris: Secondary | ICD-10-CM

## 2017-05-10 LAB — LIPID PANEL
Chol/HDL Ratio: 2.1 ratio (ref 0.0–4.4)
Cholesterol, Total: 107 mg/dL (ref 100–199)
HDL: 51 mg/dL (ref >39)
LDL Calculated: 37 mg/dL (ref 0–99)
Triglycerides: 94 mg/dL (ref 0–149)
VLDL Cholesterol Cal: 19 mg/dL (ref 5–40)

## 2017-05-16 ENCOUNTER — Encounter (HOSPITAL_COMMUNITY): Payer: PPO

## 2017-05-17 ENCOUNTER — Encounter (HOSPITAL_COMMUNITY): Payer: Self-pay | Admitting: *Deleted

## 2017-05-17 DIAGNOSIS — Z955 Presence of coronary angioplasty implant and graft: Secondary | ICD-10-CM

## 2017-05-17 NOTE — Progress Notes (Signed)
Cardiac Individual Treatment Plan  Patient Details  Name: Casey Kirk MRN: [id number] Date of Birth: 01-Dec-1945 Referring Provider:     CARDIAC REHAB [facility] from 05/03/2017 in [place]  Referring Provider  Jerline Pain MD      Initial Encounter Date:    CARDIAC REHAB PHASE II ORIENTATION from 05/03/2017 in [place]  Date  05/03/17  Referring Provider  Jerline Pain MD      Visit Diagnosis: Status post coronary artery stent placement  Patient's Home Medications on Admission:  Current Outpatient Medications:  .  albuterol (PROVENTIL HFA;VENTOLIN HFA) 108 (90 Base) MCG/ACT inhaler, Inhale 2 puffs into the lungs every 6 (six) hours as needed for wheezing or shortness of breath., Disp: 1 Inhaler, Rfl: 5 .  aspirin 81 MG tablet, Take 81 mg by mouth daily.  , Disp: , Rfl:  .  calcium citrate-vitamin D (CITRACAL+D) 315-200 MG-UNIT per tablet, Take 1 tablet by mouth daily. , Disp: , Rfl:  .  CALCIUM/MAGNESIUM/ZINC FORMULA PO, Take 1 tablet by mouth 3 (three) times a week., Disp: , Rfl:  .  cholecalciferol (VITAMIN D) 1000 units tablet, Take 1,000 Units by mouth daily., Disp: , Rfl:  .  clopidogrel (PLAVIX) 75 MG tablet, Take 1 tablet (75 mg total) daily by mouth., Disp: 90 tablet, Rfl: 3 .  diphenhydrAMINE (BENADRYL) 25 mg capsule, Take 25 mg by mouth daily as needed for allergies., Disp: , Rfl:  .  fish oil-omega-3 fatty acids 1000 MG capsule, Take 1 g by mouth 2 (two) times a week. , Disp: , Rfl:  .  fluticasone (FLONASE) 50 MCG/ACT nasal spray, Place 2 sprays into both nostrils daily as needed for allergies or rhinitis., Disp: , Rfl:  .  Melatonin 1 MG TABS, Take 3 mg by mouth at bedtime. , Disp: , Rfl:  .  Multiple Vitamin (MULTIVITAMIN) tablet, Take 1 tablet by mouth daily.  , Disp: , Rfl:  .  nitroGLYCERIN (NITROSTAT) 0.4 MG SL tablet, Place 1 tablet (0.4 mg total) under the tongue every 5 (five)  minutes as needed for chest pain., Disp: 25 tablet, Rfl: 12 .  pantoprazole (PROTONIX) 40 MG tablet, Take 1 tablet (40 mg total) by mouth daily. (Patient not taking: Reported on 05/03/2017), Disp: 30 tablet, Rfl: 6 .  rosuvastatin (CRESTOR) 20 MG tablet, Take 1 tablet daily by mouth., Disp: , Rfl:  .  traZODone (DESYREL) 50 MG tablet, Take 50 mg by mouth at bedtime., Disp: , Rfl:  .  varenicline (CHANTIX STARTING MONTH PAK) 0.5 MG X 11 & 1 MG X 42 tablet, TAKE AS DIRECTED, Disp: 53 tablet, Rfl: 0 .  vitamin E 100 UNIT capsule, Take 100 Units by mouth daily. , Disp: , Rfl:   Past Medical History: Past Medical History:  Diagnosis Date  . Allergic rhinitis   . ARTHRITIS, HX OF 01/09/2007  . BIPOLAR II DISORDER 03/17/2008  . 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 COPD 12/21/2006  . GERD (gastroesophageal reflux disease)   . GOUT 01/09/2007  . Hx of colonic polyps   . IBS (irritable bowel syndrome)   . Osteoporosis   . Sinusitis   . Thyroid disease   . TINEA CORPORIS 10/06/2009    Tobacco Use: Social History   Tobacco Use  Smoking Status Current Every Day Smoker  . Packs/day: 0.50  . Years: 40.00  . Pack years: 20.00  . Types: Cigarettes  Smokeless  Tobacco Never Used  Tobacco Comment   Pt states that she is currently smoking about 5cigarettes a day now.    Labs: Recent Review Flowsheet Data    Labs for ITP Cardiac and Pulmonary Rehab Latest Ref Rng & Units 03/17/2008 10/01/2009 09/22/2010 03/12/2017 05/10/2017   Cholestrol 100 - 199 mg/dL 136 181 182 121 107   LDLCALC 0 - 99 mg/dL 85 124(H) 125(H) 61 37   HDL >39 mg/dL 31.9(L) 38.60(L) 36.80(L) 42 51   Trlycerides 0 - 149 mg/dL 95 92.0 103.0 92 94      Capillary Blood Glucose: No results found for: GLUCAP   Exercise Target Goals:    Exercise Program Goal: Individual exercise prescription set with THRR, safety & activity barriers. Participant demonstrates ability to understand and report RPE using BORG scale, to self-measure  pulse accurately, and to acknowledge the importance of the exercise prescription.  Exercise Prescription Goal: Starting with aerobic activity 30 plus minutes a day, 3 days per week for initial exercise prescription. Provide home exercise prescription and guidelines that participant acknowledges understanding prior to discharge.  Activity Barriers & Risk Stratification: Activity Barriers & Cardiac Risk Stratification - 05/03/17 1058      Activity Barriers & Cardiac Risk Stratification   Activity Barriers  Other (comment);Back Problems    Comments  Chronic Right Knee/Leg Pain, Occasional Right Shoulder Pain, Occasional Back Pain    Cardiac Risk Stratification  High       6 Minute Walk: 6 Minute Walk    Row Name 05/03/17 1054         6 Minute Walk   Phase  Initial     Distance  1600 feet     Walk Time  6 minutes     # of Rest Breaks  0     MPH  3.03     METS  3.46     RPE  12     Perceived Dyspnea   1     VO2 Peak  12.11     Symptoms  Yes (comment)     Comments  Mild SOB +1      Resting HR  62 bpm     Resting BP  132/70     Resting Oxygen Saturation   99 %     Exercise Oxygen Saturation  during 6 min walk  97 %     Max Ex. HR  89 bpm     Max Ex. BP  140/80     2 Minute Post BP  130/70        Oxygen Initial Assessment:   Oxygen Re-Evaluation:   Oxygen Discharge (Final Oxygen Re-Evaluation):   Initial Exercise Prescription: Initial Exercise Prescription - 05/03/17 1100      Date of Initial Exercise RX and Referring Provider   Date  05/03/17    Referring Provider  Jerline Pain MD      Recumbant Bike   Level  2    RPM  70    Watts  20    Minutes  10    METs  2.97      NuStep   Level  2 Simultaneous filing. User may not have seen previous data.    SPM  70    Minutes  10    METs  2.5 Simultaneous filing. User may not have seen previous data.      Track   Laps  12    Minutes  10    METs  3.07      Prescription Details   Frequency (times per week)   3x    Duration  Progress to 30 minutes of continuous aerobic without signs/symptoms of physical distress      Intensity   THRR 40-80% of Max Heartrate  60-121    Ratings of Perceived Exertion  11-15    Perceived Dyspnea  0-4      Progression   Progression  Continue progressive overload as per policy without signs/symptoms or physical distress.      Resistance Training   Training Prescription  Yes    Weight  2lbs    Reps  10-15       Perform Capillary Blood Glucose checks as needed.  Exercise Prescription Changes:  Exercise Prescription Changes    Row Name 05/07/17 1100 05/09/17 0952           Response to Exercise   Blood Pressure (Admit)  106/60  118/80      Blood Pressure (Exercise)  138/82  128/76      Blood Pressure (Exit)  108/70  104/74      Heart Rate (Admit)  67 bpm  69 bpm      Heart Rate (Exercise)  102 bpm  97 bpm      Heart Rate (Exit)  79 bpm  76 bpm      Oxygen Saturation (Exercise)  -  97 % room air      Rating of Perceived Exertion (Exercise)  12  12      Symptoms  none  none      Duration  Progress to 30 minutes of  aerobic without signs/symptoms of physical distress  Progress to 30 minutes of  aerobic without signs/symptoms of physical distress      Intensity  THRR unchanged  THRR unchanged        Progression   Progression  Continue to progress workloads to maintain intensity without signs/symptoms of physical distress.  Continue to progress workloads to maintain intensity without signs/symptoms of physical distress.      Average METs  3.3  3.2        Resistance Training   Training Prescription  Yes  Yes      Weight  2lbs  2lbs      Reps  10-15  10-15      Time  10 Minutes  10 Minutes        Interval Training   Interval Training  No  No        Recumbant Bike   Level  2  2      Watts  31  31      Minutes  10  10      METs  3.42  2.9        NuStep   Level  2  3      SPM  80  80      Minutes  10  10      METs  3.2  3.6        Track    Laps  13  12      Minutes  10  10      METs  3.26  3.09         Exercise Comments:  Exercise Comments    Row Name 05/07/17 1110           Exercise Comments  Off to a good start with exercise. Reviewed goals with patient.  Exercise Goals and Review:  Exercise Goals    Row Name 05/03/17 1004             Exercise Goals   Increase Physical Activity  Yes       Intervention  Provide advice, education, support and counseling about physical activity/exercise needs.;Develop an individualized exercise prescription for aerobic and resistive training based on initial evaluation findings, risk stratification, comorbidities and participant's personal goals.       Expected Outcomes  Achievement of increased cardiorespiratory fitness and enhanced flexibility, muscular endurance and strength shown through measurements of functional capacity and personal statement of participant.       Increase Strength and Stamina  Yes       Intervention  Provide advice, education, support and counseling about physical activity/exercise needs.;Develop an individualized exercise prescription for aerobic and resistive training based on initial evaluation findings, risk stratification, comorbidities and participant's personal goals.       Expected Outcomes  Achievement of increased cardiorespiratory fitness and enhanced flexibility, muscular endurance and strength shown through measurements of functional capacity and personal statement of participant.       Able to understand and use rate of perceived exertion (RPE) scale  Yes       Intervention  Provide education and explanation on how to use RPE scale       Expected Outcomes  Short Term: Able to use RPE daily in rehab to express subjective intensity level;Long Term:  Able to use RPE to guide intensity level when exercising independently       Knowledge and understanding of Target Heart Rate Range (THRR)  Yes       Intervention  Provide education and  explanation of THRR including how the numbers were predicted and where they are located for reference       Expected Outcomes  Short Term: Able to state/look up THRR;Short Term: Able to use daily as guideline for intensity in rehab;Long Term: Able to use THRR to govern intensity when exercising independently       Able to check pulse independently  Yes       Intervention  Provide education and demonstration on how to check pulse in carotid and radial arteries.;Review the importance of being able to check your own pulse for safety during independent exercise       Expected Outcomes  Short Term: Able to explain why pulse checking is important during independent exercise;Long Term: Able to check pulse independently and accurately       Understanding of Exercise Prescription  Yes       Intervention  Provide education, explanation, and written materials on patient's individual exercise prescription       Expected Outcomes  Short Term: Able to explain program exercise prescription;Long Term: Able to explain home exercise prescription to exercise independently          Exercise Goals Re-Evaluation : Exercise Goals Re-Evaluation    Row Name 05/07/17 1105             Exercise Goal Re-Evaluation   Exercise Goals Review  Increase Physical Activity       Comments  Patient is currently walking 15-20 minutes every morning. Pt previously participated in yoga and zumba and used exercise videos for workout prior to cardiac event.       Expected Outcomes  Increase workloads as tolerated.           Discharge Exercise Prescription (Final Exercise Prescription Changes): Exercise Prescription Changes - 05/09/17 6213  Response to Exercise   Blood Pressure (Admit)  118/80    Blood Pressure (Exercise)  128/76    Blood Pressure (Exit)  104/74    Heart Rate (Admit)  69 bpm    Heart Rate (Exercise)  97 bpm    Heart Rate (Exit)  76 bpm    Oxygen Saturation (Exercise)  97 % room air    Rating of  Perceived Exertion (Exercise)  12    Symptoms  none    Duration  Progress to 30 minutes of  aerobic without signs/symptoms of physical distress    Intensity  THRR unchanged      Progression   Progression  Continue to progress workloads to maintain intensity without signs/symptoms of physical distress.    Average METs  3.2      Resistance Training   Training Prescription  Yes    Weight  2lbs    Reps  10-15    Time  10 Minutes      Interval Training   Interval Training  No      Recumbant Bike   Level  2    Watts  31    Minutes  10    METs  2.9      NuStep   Level  3    SPM  80    Minutes  10    METs  3.6      Track   Laps  12    Minutes  10    METs  3.09       Nutrition:  Target Goals: Understanding of nutrition guidelines, daily intake of sodium 1500mg , cholesterol 200mg , calories 30% from fat and 7% or less from saturated fats, daily to have 5 or more servings of fruits and vegetables.  Biometrics: Pre Biometrics - 05/03/17 1100      Pre Biometrics   Height  5\' 3"  (1.6 m)    Weight  143 lb 4.8 oz (65 kg)    Waist Circumference  33.5 inches    Hip Circumference  38 inches    Waist to Hip Ratio  0.88 %    BMI (Calculated)  25.39    Triceps Skinfold  18 mm    % Body Fat  35.1 %    Grip Strength  34 kg    Flexibility  16 in    Single Leg Stand  16 seconds        Nutrition Therapy Plan and Nutrition Goals:   Nutrition Discharge: Nutrition Scores:   Nutrition Goals Re-Evaluation:   Nutrition Goals Re-Evaluation:   Nutrition Goals Discharge (Final Nutrition Goals Re-Evaluation):   Psychosocial: Target Goals: Acknowledge presence or absence of significant depression and/or stress, maximize coping skills, provide positive support system. Participant is able to verbalize types and ability to use techniques and skills needed for reducing stress and depression.  Initial Review & Psychosocial Screening: Initial Psych Review & Screening - 05/03/17  1252      Initial Review   Current issues with  None Identified      Family Dynamics   Good Support System?  Yes recently married three years, feels supported by her husband      Barriers   Psychosocial barriers to participate in program  The patient should benefit from training in stress management and relaxation.      Screening Interventions   Interventions  Encouraged to exercise       Quality of Life Scores: Quality of Life - 05/03/17 1105  Quality of Life Scores   Health/Function Pre  24.77 %    Socioeconomic Pre  26.25 %    Psych/Spiritual Pre  25.71 %    Family Pre  20.3 %    GLOBAL Pre  24.56 %       PHQ-9: Recent Review Flowsheet Data    Depression screen [facility] 2/9 05/07/2017   Decreased Interest 0   Down, Depressed, Hopeless 0   PHQ - 2 Score 0     Interpretation of Total Score  Total Score Depression Severity:  1-4 = Minimal depression, 5-9 = Mild depression, 10-14 = Moderate depression, 15-19 = Moderately severe depression, 20-27 = Severe depression   Psychosocial Evaluation and Intervention:   Psychosocial Re-Evaluation: Psychosocial Re-Evaluation    [place] Name 05/17/17 1219             Psychosocial Re-Evaluation   Current issues with  None Identified       Interventions  Encouraged to attend Cardiac Rehabilitation for the exercise       Continue Psychosocial Services   No Follow up required          Psychosocial Discharge (Final Psychosocial Re-Evaluation): Psychosocial Re-Evaluation - 05/17/17 1219      Psychosocial Re-Evaluation   Current issues with  None Identified    Interventions  Encouraged to attend Cardiac Rehabilitation for the exercise    Continue Psychosocial Services   No Follow up required       Vocational Rehabilitation: Provide vocational rehab assistance to qualifying candidates.   Vocational Rehab Evaluation & Intervention: Vocational Rehab - 05/03/17 1255      Initial Vocational Rehab Evaluation & Intervention    Assessment shows need for Vocational Rehabilitation  No Pt is retired        Education: Education Goals: Education classes will be provided on a weekly basis, covering required topics. Participant will state understanding/return demonstration of topics presented.  Learning Barriers/Preferences: Learning Barriers/Preferences - 05/03/17 0936      Learning Barriers/Preferences   Learning Barriers  Sight    Learning Preferences  Skilled Demonstration;Verbal Instruction;Individual Instruction;Pictoral       Education Topics: Count Your Pulse:  -Group instruction provided by verbal instruction, demonstration, patient participation and written materials to support subject.  Instructors address importance of being able to find your pulse and how to count your pulse when at home without a heart monitor.  Patients get hands on experience counting their pulse with staff help and individually.   Heart Attack, Angina, and Risk Factor Modification:  -Group instruction provided by verbal instruction, video, and written materials to support subject.  Instructors address signs and symptoms of angina and heart attacks.    Also discuss risk factors for heart disease and how to make changes to improve heart health risk factors.   Functional Fitness:  -Group instruction provided by verbal instruction, demonstration, patient participation, and written materials to support subject.  Instructors address safety measures for doing things around the house.  Discuss how to get up and down off the floor, how to pick things up properly, how to safely get out of a chair without assistance, and balance training.   Meditation and Mindfulness:  -Group instruction provided by verbal instruction, patient participation, and written materials to support subject.  Instructor addresses importance of mindfulness and meditation practice to help reduce stress and improve awareness.  Instructor also leads participants through  a meditation exercise.    Stretching for Flexibility and Mobility:  -Group instruction provided by verbal  instruction, patient participation, and written materials to support subject.  Instructors lead participants through series of stretches that are designed to increase flexibility thus improving mobility.  These stretches are additional exercise for major muscle groups that are typically performed during regular warm up and cool down.   Hands Only CPR:  -Group verbal, video, and participation provides a basic overview of AHA guidelines for community CPR. Role-play of emergencies allow participants the opportunity to practice calling for help and chest compression technique with discussion of AED use.   Hypertension: -Group verbal and written instruction that provides a basic overview of hypertension including the most recent diagnostic guidelines, risk factor reduction with self-care instructions and medication management.    Nutrition I class: Heart Healthy Eating:  -Group instruction provided by PowerPoint slides, verbal discussion, and written materials to support subject matter. The instructor gives an explanation and review of the Therapeutic Lifestyle Changes diet recommendations, which includes a discussion on lipid goals, dietary fat, sodium, fiber, plant stanol/sterol esters, sugar, and the components of a well-balanced, healthy diet.   Nutrition II class: Lifestyle Skills:  -Group instruction provided by PowerPoint slides, verbal discussion, and written materials to support subject matter. The instructor gives an explanation and review of label reading, grocery shopping for heart health, heart healthy recipe modifications, and ways to make healthier choices when eating out.   Diabetes Question & Answer:  -Group instruction provided by PowerPoint slides, verbal discussion, and written materials to support subject matter. The instructor gives an explanation and review of diabetes  co-morbidities, pre- and post-prandial blood glucose goals, pre-exercise blood glucose goals, signs, symptoms, and treatment of hypoglycemia and hyperglycemia, and foot care basics.   Diabetes Blitz:  -Group instruction provided by PowerPoint slides, verbal discussion, and written materials to support subject matter. The instructor gives an explanation and review of the physiology behind type 1 and type 2 diabetes, diabetes medications and rational behind using different medications, pre- and post-prandial blood glucose recommendations and Hemoglobin A1c goals, diabetes diet, and exercise including blood glucose guidelines for exercising safely.    Portion Distortion:  -Group instruction provided by PowerPoint slides, verbal discussion, written materials, and food models to support subject matter. The instructor gives an explanation of serving size versus portion size, changes in portions sizes over the last 20 years, and what consists of a serving from each food group.   Stress Management:  -Group instruction provided by verbal instruction, video, and written materials to support subject matter.  Instructors review role of stress in heart disease and how to cope with stress positively.     Exercising on Your Own:  -Group instruction provided by verbal instruction, power point, and written materials to support subject.  Instructors discuss benefits of exercise, components of exercise, frequency and intensity of exercise, and end points for exercise.  Also discuss use of nitroglycerin and activating EMS.  Review options of places to exercise outside of rehab.  Review guidelines for sex with heart disease.   Cardiac Drugs I:  -Group instruction provided by verbal instruction and written materials to support subject.  Instructor reviews cardiac drug classes: antiplatelets, anticoagulants, beta blockers, and statins.  Instructor discusses reasons, side effects, and lifestyle considerations for each  drug class.   Cardiac Drugs II:  -Group instruction provided by verbal instruction and written materials to support subject.  Instructor reviews cardiac drug classes: angiotensin converting enzyme inhibitors (ACE-I), angiotensin II receptor blockers (ARBs), nitrates, and calcium channel blockers.  Instructor discusses reasons, side effects, and  lifestyle considerations for each drug class.   Anatomy and Physiology of the Circulatory System:  Group verbal and written instruction and models provide basic cardiac anatomy and physiology, with the coronary electrical and arterial systems. Review of: AMI, Angina, Valve disease, Heart Failure, Peripheral Artery Disease, Cardiac Arrhythmia, Pacemakers, and the ICD.   Other Education:  -Group or individual verbal, written, or video instructions that support the educational goals of the cardiac rehab program.   Knowledge Questionnaire Score: Knowledge Questionnaire Score - 05/03/17 1102      Knowledge Questionnaire Score   Pre Score  27/28       Core Components/Risk Factors/Patient Goals at Admission: Personal Goals and Risk Factors at Admission - 05/17/17 1216      Core Components/Risk Factors/Patient Goals on Admission   Tobacco Cessation  Yes    Number of packs per day  3-4 cigarettes a day    Intervention  Assist the participant in steps to quit. Provide individualized education and counseling about committing to Tobacco Cessation, relapse prevention, and pharmacological support that can be provided by physician.;Advice worker, assist with locating and accessing local/national Quit Smoking programs, and support quit date choice.    Expected Outcomes  Short Term: Will demonstrate readiness to quit, by selecting a quit date.;Short Term: Will quit all tobacco product use, adhering to prevention of relapse plan.;Long Term: Complete abstinence from all tobacco products for at least 12 months from quit date.    Improve shortness of  breath with ADL's  Yes    Intervention  Provide education, individualized exercise plan and daily activity instruction to help decrease symptoms of SOB with activities of daily living.    Expected Outcomes  Short Term: Achieves a reduction of symptoms when performing activities of daily living.    Develop more efficient breathing techniques such as purse lipped breathing and diaphragmatic breathing; and practicing self-pacing with activity  Yes    Intervention  Provide education, demonstration and support about specific breathing techniuqes utilized for more efficient breathing. Include techniques such as pursed lipped breathing, diaphragmatic breathing and self-pacing activity.    Expected Outcomes  Short Term: Participant will be able to demonstrate and use breathing techniques as needed throughout daily activities.       Core Components/Risk Factors/Patient Goals Review:  Goals and Risk Factor Review    Row Name 05/17/17 1217 05/17/17 1218           Core Components/Risk Factors/Patient Goals Review   Personal Goals Review  Tobacco Cessation  Tobacco Cessation      Review  Richardine Peppers continues to smoke. Will continue to encourage smoking cessation      Expected Outcomes  -  Arian will continue to come to exercise and work on smoking cessation.         Core Components/Risk Factors/Patient Goals at Discharge (Final Review):  Goals and Risk Factor Review - 05/17/17 1218      Core Components/Risk Factors/Patient Goals Review   Personal Goals Review  Tobacco Cessation    Review  Lavender continues to smoke. Will continue to encourage smoking cessation    Expected Outcomes  Vauda will continue to come to exercise and work on smoking cessation.       ITP Comments: ITP Comments    Row Name 05/03/17 0935 05/03/17 1136 05/17/17 1204       ITP Comments  Dr. Fransico Him  Dr. Fransico Him, Medical Director  30 Day ITP comment. Sameera is off to a good  start to exercise. Sary has attended 3  exercise sessions and is out with family this week.        Comments: See ITP comments.Barnet Pall, RN,BSN 05/18/2017 1:54 PM

## 2017-05-21 ENCOUNTER — Encounter (HOSPITAL_COMMUNITY): Payer: PPO

## 2017-05-21 ENCOUNTER — Telehealth (HOSPITAL_COMMUNITY): Payer: Self-pay | Admitting: Family Medicine

## 2017-05-23 ENCOUNTER — Encounter (HOSPITAL_COMMUNITY): Payer: PPO

## 2017-05-27 DIAGNOSIS — J209 Acute bronchitis, unspecified: Secondary | ICD-10-CM | POA: Diagnosis not present

## 2017-05-28 ENCOUNTER — Encounter (HOSPITAL_COMMUNITY)
Admission: RE | Admit: 2017-05-28 | Discharge: 2017-05-28 | Disposition: A | Discharge status: Home / Self Care | Payer: PPO | Source: Ambulatory Visit | Attending: Cardiology | Admitting: Cardiology

## 2017-05-28 DIAGNOSIS — Z7902 Long term (current) use of antithrombotics/antiplatelets: Secondary | ICD-10-CM | POA: Diagnosis not present

## 2017-05-28 DIAGNOSIS — Z79899 Other long term (current) drug therapy: Secondary | ICD-10-CM | POA: Insufficient documentation

## 2017-05-28 DIAGNOSIS — Z7951 Long term (current) use of inhaled steroids: Secondary | ICD-10-CM | POA: Insufficient documentation

## 2017-05-28 DIAGNOSIS — F17210 Nicotine dependence, cigarettes, uncomplicated: Secondary | ICD-10-CM | POA: Insufficient documentation

## 2017-05-28 DIAGNOSIS — M810 Age-related osteoporosis without current pathological fracture: Secondary | ICD-10-CM | POA: Insufficient documentation

## 2017-05-28 DIAGNOSIS — J449 Chronic obstructive pulmonary disease, unspecified: Secondary | ICD-10-CM | POA: Insufficient documentation

## 2017-05-28 DIAGNOSIS — Z7982 Long term (current) use of aspirin: Secondary | ICD-10-CM | POA: Diagnosis not present

## 2017-05-28 DIAGNOSIS — E079 Disorder of thyroid, unspecified: Secondary | ICD-10-CM | POA: Insufficient documentation

## 2017-05-28 DIAGNOSIS — M109 Gout, unspecified: Secondary | ICD-10-CM | POA: Insufficient documentation

## 2017-05-28 DIAGNOSIS — Z86010 Personal history of colonic polyps: Secondary | ICD-10-CM | POA: Insufficient documentation

## 2017-05-28 DIAGNOSIS — K219 Gastro-esophageal reflux disease without esophagitis: Secondary | ICD-10-CM | POA: Diagnosis not present

## 2017-05-28 DIAGNOSIS — Z955 Presence of coronary angioplasty implant and graft: Secondary | ICD-10-CM | POA: Insufficient documentation

## 2017-05-28 DIAGNOSIS — F319 Bipolar disorder, unspecified: Secondary | ICD-10-CM | POA: Diagnosis not present

## 2017-05-28 DIAGNOSIS — K589 Irritable bowel syndrome without diarrhea: Secondary | ICD-10-CM | POA: Diagnosis not present

## 2017-05-28 DIAGNOSIS — M1990 Unspecified osteoarthritis, unspecified site: Secondary | ICD-10-CM | POA: Diagnosis not present

## 2017-05-30 ENCOUNTER — Encounter (HOSPITAL_COMMUNITY)
Admission: RE | Admit: 2017-05-30 | Discharge: 2017-05-30 | Disposition: A | Discharge status: Home / Self Care | Payer: PPO | Source: Ambulatory Visit | Attending: Cardiology | Admitting: Cardiology

## 2017-05-30 VITALS — BP 118/72 | HR 75 | Wt 142.2 lb

## 2017-05-30 DIAGNOSIS — Z955 Presence of coronary angioplasty implant and graft: Secondary | ICD-10-CM | POA: Diagnosis not present

## 2017-06-04 ENCOUNTER — Encounter (HOSPITAL_COMMUNITY): Payer: PPO

## 2017-06-04 ENCOUNTER — Encounter (HOSPITAL_COMMUNITY): Payer: Self-pay | Admitting: Emergency Medicine

## 2017-06-04 ENCOUNTER — Emergency Department (HOSPITAL_COMMUNITY): Payer: No Typology Code available for payment source

## 2017-06-04 ENCOUNTER — Emergency Department (HOSPITAL_COMMUNITY)
Admission: EM | Admit: 2017-06-04 | Discharge: 2017-06-04 | Disposition: A | Discharge status: Home / Self Care | Payer: No Typology Code available for payment source | Attending: Emergency Medicine | Admitting: Emergency Medicine

## 2017-06-04 DIAGNOSIS — Y92410 Unspecified street and highway as the place of occurrence of the external cause: Secondary | ICD-10-CM | POA: Diagnosis not present

## 2017-06-04 DIAGNOSIS — Z7982 Long term (current) use of aspirin: Secondary | ICD-10-CM | POA: Insufficient documentation

## 2017-06-04 DIAGNOSIS — E039 Hypothyroidism, unspecified: Secondary | ICD-10-CM | POA: Insufficient documentation

## 2017-06-04 DIAGNOSIS — Y9389 Activity, other specified: Secondary | ICD-10-CM | POA: Diagnosis not present

## 2017-06-04 DIAGNOSIS — J449 Chronic obstructive pulmonary disease, unspecified: Secondary | ICD-10-CM | POA: Insufficient documentation

## 2017-06-04 DIAGNOSIS — I2510 Atherosclerotic heart disease of native coronary artery without angina pectoris: Secondary | ICD-10-CM | POA: Diagnosis not present

## 2017-06-04 DIAGNOSIS — Z91040 Latex allergy status: Secondary | ICD-10-CM | POA: Insufficient documentation

## 2017-06-04 DIAGNOSIS — Y999 Unspecified external cause status: Secondary | ICD-10-CM | POA: Diagnosis not present

## 2017-06-04 DIAGNOSIS — T148XXA Other injury of unspecified body region, initial encounter: Secondary | ICD-10-CM | POA: Diagnosis not present

## 2017-06-04 DIAGNOSIS — F17210 Nicotine dependence, cigarettes, uncomplicated: Secondary | ICD-10-CM | POA: Insufficient documentation

## 2017-06-04 DIAGNOSIS — S161XXA Strain of muscle, fascia and tendon at neck level, initial encounter: Secondary | ICD-10-CM

## 2017-06-04 DIAGNOSIS — S199XXA Unspecified injury of neck, initial encounter: Secondary | ICD-10-CM | POA: Diagnosis not present

## 2017-06-04 DIAGNOSIS — M542 Cervicalgia: Secondary | ICD-10-CM | POA: Diagnosis not present

## 2017-06-04 MED ORDER — DIAZEPAM 5 MG PO TABS: 2.5000 mg | ORAL_TABLET | Freq: Every evening | ORAL | 0 refills | Status: DC | PRN

## 2017-06-04 MED ORDER — DICLOFENAC SODIUM 1 % TD GEL: 2.0000 g | Freq: Four times a day (QID) | TRANSDERMAL | 1 refills | Status: DC

## 2017-06-04 MED ORDER — ACETAMINOPHEN 325 MG PO TABS
650.0000 mg | ORAL_TABLET | Freq: Once | ORAL | Status: AC
  Administered 2017-06-04: 650 mg via ORAL
  Filled 2017-06-04: qty 2

## 2017-06-04 NOTE — ED Notes (Signed)
Pt ambulated to restroom and reports pain improving.
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
## 2017-06-04 NOTE — ED Notes (Signed)
Patient transported to CT 

## 2017-06-04 NOTE — ED Notes (Signed)
Warm pack provided to neck
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
## 2017-06-04 NOTE — Discharge Instructions (Signed)
Make sure you are taking 2 extra strength Tylenol every 4-6 hours for pain.  Also you can use heat but I would avoid ice.  Return immediately if you have any difficulty raising her arms or walking

## 2017-06-05 ENCOUNTER — Telehealth (HOSPITAL_COMMUNITY): Payer: Self-pay | Admitting: Family Medicine

## 2017-06-06 ENCOUNTER — Other Ambulatory Visit: Payer: Self-pay

## 2017-06-06 ENCOUNTER — Encounter (HOSPITAL_COMMUNITY): Payer: PPO

## 2017-06-06 DIAGNOSIS — S161XXA Strain of muscle, fascia and tendon at neck level, initial encounter: Secondary | ICD-10-CM | POA: Diagnosis not present

## 2017-06-06 DIAGNOSIS — M5442 Lumbago with sciatica, left side: Secondary | ICD-10-CM | POA: Diagnosis not present

## 2017-06-08 ENCOUNTER — Telehealth (HOSPITAL_COMMUNITY): Payer: Self-pay | Admitting: Family Medicine

## 2017-06-11 ENCOUNTER — Telehealth (HOSPITAL_COMMUNITY): Payer: Self-pay | Admitting: *Deleted

## 2017-06-11 ENCOUNTER — Other Ambulatory Visit: Payer: Self-pay

## 2017-06-11 ENCOUNTER — Encounter (HOSPITAL_COMMUNITY): Payer: PPO

## 2017-06-11 NOTE — Patient Outreach (Signed)
Patient returned my call that I left her after in regards to ED visit.  Patient verified PCP and has all necessary appointments arranged.  She does not have any transportation issues at this time.  I explained [facility] services and 24 Hour Nurse Advice Lines.  She was not interested in completing engagement tool at this time.  But would be looking for information in the mail and she will give [place] a call back should she need our services in the future.

## 2017-06-12 ENCOUNTER — Encounter (HOSPITAL_COMMUNITY): Payer: Self-pay | Admitting: *Deleted

## 2017-06-12 DIAGNOSIS — Z955 Presence of coronary angioplasty implant and graft: Secondary | ICD-10-CM
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
## 2017-06-13 ENCOUNTER — Encounter (HOSPITAL_COMMUNITY): Payer: PPO

## 2017-06-14 DIAGNOSIS — M5442 Lumbago with sciatica, left side: Secondary | ICD-10-CM | POA: Diagnosis not present

## 2017-06-14 DIAGNOSIS — M542 Cervicalgia: Secondary | ICD-10-CM | POA: Diagnosis not present

## 2017-06-15 ENCOUNTER — Encounter (HOSPITAL_COMMUNITY): Payer: Self-pay | Admitting: *Deleted

## 2017-06-15 ENCOUNTER — Telehealth (HOSPITAL_COMMUNITY): Payer: Self-pay | Admitting: Family Medicine

## 2017-06-15 DIAGNOSIS — Z955 Presence of coronary angioplasty implant and graft: Secondary | ICD-10-CM
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
## 2017-06-18 ENCOUNTER — Encounter (HOSPITAL_COMMUNITY): Payer: PPO

## 2017-06-18 DIAGNOSIS — S39012D Strain of muscle, fascia and tendon of lower back, subsequent encounter: Secondary | ICD-10-CM | POA: Diagnosis not present

## 2017-06-18 DIAGNOSIS — M545 Low back pain: Secondary | ICD-10-CM | POA: Diagnosis not present

## 2017-06-18 DIAGNOSIS — M799 Soft tissue disorder, unspecified: Secondary | ICD-10-CM | POA: Diagnosis not present

## 2017-06-18 DIAGNOSIS — M62830 Muscle spasm of back: Secondary | ICD-10-CM | POA: Diagnosis not present

## 2017-06-18 DIAGNOSIS — R262 Difficulty in walking, not elsewhere classified: Secondary | ICD-10-CM | POA: Diagnosis not present

## 2017-06-18 DIAGNOSIS — M542 Cervicalgia: Secondary | ICD-10-CM | POA: Diagnosis not present

## 2017-06-20 ENCOUNTER — Encounter (HOSPITAL_COMMUNITY): Payer: PPO

## 2017-06-21 DIAGNOSIS — M542 Cervicalgia: Secondary | ICD-10-CM | POA: Diagnosis not present

## 2017-06-21 DIAGNOSIS — R262 Difficulty in walking, not elsewhere classified: Secondary | ICD-10-CM | POA: Diagnosis not present

## 2017-06-21 DIAGNOSIS — S39012D Strain of muscle, fascia and tendon of lower back, subsequent encounter: Secondary | ICD-10-CM | POA: Diagnosis not present

## 2017-06-21 DIAGNOSIS — M799 Soft tissue disorder, unspecified: Secondary | ICD-10-CM | POA: Diagnosis not present

## 2017-06-21 DIAGNOSIS — M62830 Muscle spasm of back: Secondary | ICD-10-CM | POA: Diagnosis not present

## 2017-06-21 DIAGNOSIS — M545 Low back pain: Secondary | ICD-10-CM | POA: Diagnosis not present

## 2017-06-22 NOTE — Addendum Note (Signed)
Encounter addended by: Sol Passer on: 06/22/2017 7:29 AM  Actions taken: Flowsheet data copied forward, Visit Navigator Flowsheet section accepted, Vitals modified

## 2017-06-25 ENCOUNTER — Encounter (HOSPITAL_COMMUNITY): Payer: PPO

## 2017-06-27 ENCOUNTER — Encounter (HOSPITAL_COMMUNITY): Payer: PPO

## 2017-06-28 ENCOUNTER — Ambulatory Visit: Payer: PPO | Admitting: Cardiology

## 2017-06-28 DIAGNOSIS — M545 Low back pain: Secondary | ICD-10-CM | POA: Diagnosis not present

## 2017-06-28 DIAGNOSIS — M799 Soft tissue disorder, unspecified: Secondary | ICD-10-CM | POA: Diagnosis not present

## 2017-06-28 DIAGNOSIS — R262 Difficulty in walking, not elsewhere classified: Secondary | ICD-10-CM | POA: Diagnosis not present

## 2017-06-28 DIAGNOSIS — S39012D Strain of muscle, fascia and tendon of lower back, subsequent encounter: Secondary | ICD-10-CM | POA: Diagnosis not present

## 2017-06-28 DIAGNOSIS — M542 Cervicalgia: Secondary | ICD-10-CM | POA: Diagnosis not present

## 2017-06-28 DIAGNOSIS — M62830 Muscle spasm of back: Secondary | ICD-10-CM | POA: Diagnosis not present

## 2017-06-29 DIAGNOSIS — S39012D Strain of muscle, fascia and tendon of lower back, subsequent encounter: Secondary | ICD-10-CM | POA: Diagnosis not present

## 2017-06-29 DIAGNOSIS — M799 Soft tissue disorder, unspecified: Secondary | ICD-10-CM | POA: Diagnosis not present

## 2017-06-29 DIAGNOSIS — M545 Low back pain: Secondary | ICD-10-CM | POA: Diagnosis not present

## 2017-06-29 DIAGNOSIS — R262 Difficulty in walking, not elsewhere classified: Secondary | ICD-10-CM | POA: Diagnosis not present

## 2017-06-29 DIAGNOSIS — M542 Cervicalgia: Secondary | ICD-10-CM | POA: Diagnosis not present

## 2017-06-29 DIAGNOSIS — M62830 Muscle spasm of back: Secondary | ICD-10-CM | POA: Diagnosis not present

## 2017-07-02 ENCOUNTER — Encounter (HOSPITAL_COMMUNITY): Payer: PPO

## 2017-07-02 DIAGNOSIS — M545 Low back pain: Secondary | ICD-10-CM | POA: Diagnosis not present

## 2017-07-02 DIAGNOSIS — S39012D Strain of muscle, fascia and tendon of lower back, subsequent encounter: Secondary | ICD-10-CM | POA: Diagnosis not present

## 2017-07-02 DIAGNOSIS — M62830 Muscle spasm of back: Secondary | ICD-10-CM | POA: Diagnosis not present

## 2017-07-02 DIAGNOSIS — R262 Difficulty in walking, not elsewhere classified: Secondary | ICD-10-CM | POA: Diagnosis not present

## 2017-07-02 DIAGNOSIS — M542 Cervicalgia: Secondary | ICD-10-CM | POA: Diagnosis not present

## 2017-07-02 DIAGNOSIS — M799 Soft tissue disorder, unspecified: Secondary | ICD-10-CM | POA: Diagnosis not present

## 2017-07-04 ENCOUNTER — Encounter (HOSPITAL_COMMUNITY): Payer: PPO

## 2017-07-05 DIAGNOSIS — S39012D Strain of muscle, fascia and tendon of lower back, subsequent encounter: Secondary | ICD-10-CM | POA: Diagnosis not present

## 2017-07-05 DIAGNOSIS — M62830 Muscle spasm of back: Secondary | ICD-10-CM | POA: Diagnosis not present

## 2017-07-05 DIAGNOSIS — M542 Cervicalgia: Secondary | ICD-10-CM | POA: Diagnosis not present

## 2017-07-05 DIAGNOSIS — R262 Difficulty in walking, not elsewhere classified: Secondary | ICD-10-CM | POA: Diagnosis not present

## 2017-07-05 DIAGNOSIS — M799 Soft tissue disorder, unspecified: Secondary | ICD-10-CM | POA: Diagnosis not present

## 2017-07-05 DIAGNOSIS — M545 Low back pain: Secondary | ICD-10-CM | POA: Diagnosis not present

## 2017-07-06 DIAGNOSIS — S39012D Strain of muscle, fascia and tendon of lower back, subsequent encounter: Secondary | ICD-10-CM | POA: Diagnosis not present

## 2017-07-06 DIAGNOSIS — M62830 Muscle spasm of back: Secondary | ICD-10-CM | POA: Diagnosis not present

## 2017-07-06 DIAGNOSIS — M542 Cervicalgia: Secondary | ICD-10-CM | POA: Diagnosis not present

## 2017-07-06 DIAGNOSIS — R262 Difficulty in walking, not elsewhere classified: Secondary | ICD-10-CM | POA: Diagnosis not present

## 2017-07-06 DIAGNOSIS — M799 Soft tissue disorder, unspecified: Secondary | ICD-10-CM | POA: Diagnosis not present

## 2017-07-06 DIAGNOSIS — M545 Low back pain: Secondary | ICD-10-CM | POA: Diagnosis not present

## 2017-07-09 ENCOUNTER — Encounter (HOSPITAL_COMMUNITY): Payer: PPO

## 2017-07-10 DIAGNOSIS — M799 Soft tissue disorder, unspecified: Secondary | ICD-10-CM | POA: Diagnosis not present

## 2017-07-10 DIAGNOSIS — M545 Low back pain: Secondary | ICD-10-CM | POA: Diagnosis not present

## 2017-07-10 DIAGNOSIS — S39012D Strain of muscle, fascia and tendon of lower back, subsequent encounter: Secondary | ICD-10-CM | POA: Diagnosis not present

## 2017-07-10 DIAGNOSIS — M62830 Muscle spasm of back: Secondary | ICD-10-CM | POA: Diagnosis not present

## 2017-07-10 DIAGNOSIS — M542 Cervicalgia: Secondary | ICD-10-CM | POA: Diagnosis not present

## 2017-07-10 DIAGNOSIS — R262 Difficulty in walking, not elsewhere classified: Secondary | ICD-10-CM | POA: Diagnosis not present

## 2017-07-11 ENCOUNTER — Encounter (HOSPITAL_COMMUNITY): Payer: PPO

## 2017-07-12 DIAGNOSIS — R262 Difficulty in walking, not elsewhere classified: Secondary | ICD-10-CM | POA: Diagnosis not present

## 2017-07-12 DIAGNOSIS — M542 Cervicalgia: Secondary | ICD-10-CM | POA: Diagnosis not present

## 2017-07-12 DIAGNOSIS — M799 Soft tissue disorder, unspecified: Secondary | ICD-10-CM | POA: Diagnosis not present

## 2017-07-12 DIAGNOSIS — M545 Low back pain: Secondary | ICD-10-CM | POA: Diagnosis not present

## 2017-07-12 DIAGNOSIS — M62830 Muscle spasm of back: Secondary | ICD-10-CM | POA: Diagnosis not present

## 2017-07-12 DIAGNOSIS — S39012D Strain of muscle, fascia and tendon of lower back, subsequent encounter: Secondary | ICD-10-CM | POA: Diagnosis not present

## 2017-07-13 DIAGNOSIS — R262 Difficulty in walking, not elsewhere classified: Secondary | ICD-10-CM | POA: Diagnosis not present

## 2017-07-13 DIAGNOSIS — M542 Cervicalgia: Secondary | ICD-10-CM | POA: Diagnosis not present

## 2017-07-13 DIAGNOSIS — M62830 Muscle spasm of back: Secondary | ICD-10-CM | POA: Diagnosis not present

## 2017-07-13 DIAGNOSIS — M545 Low back pain: Secondary | ICD-10-CM | POA: Diagnosis not present

## 2017-07-13 DIAGNOSIS — M799 Soft tissue disorder, unspecified: Secondary | ICD-10-CM | POA: Diagnosis not present

## 2017-07-13 DIAGNOSIS — S39012D Strain of muscle, fascia and tendon of lower back, subsequent encounter: Secondary | ICD-10-CM | POA: Diagnosis not present

## 2017-07-16 ENCOUNTER — Encounter: Payer: Self-pay | Admitting: Cardiology

## 2017-07-16 ENCOUNTER — Ambulatory Visit: Payer: PPO | Admitting: Cardiology

## 2017-07-16 ENCOUNTER — Encounter (HOSPITAL_COMMUNITY): Payer: PPO

## 2017-07-16 ENCOUNTER — Encounter (INDEPENDENT_AMBULATORY_CARE_PROVIDER_SITE_OTHER): Payer: Self-pay

## 2017-07-16 VITALS — BP 122/84 | HR 73 | Ht 64.0 in | Wt 143.3 lb

## 2017-07-16 DIAGNOSIS — E7800 Pure hypercholesterolemia, unspecified: Secondary | ICD-10-CM

## 2017-07-16 DIAGNOSIS — Z720 Tobacco use: Secondary | ICD-10-CM | POA: Diagnosis not present

## 2017-07-16 DIAGNOSIS — R0789 Other chest pain: Secondary | ICD-10-CM

## 2017-07-16 DIAGNOSIS — F329 Major depressive disorder, single episode, unspecified: Secondary | ICD-10-CM | POA: Diagnosis not present

## 2017-07-16 DIAGNOSIS — I700 Atherosclerosis of aorta: Secondary | ICD-10-CM

## 2017-07-16 NOTE — Patient Instructions (Signed)
Medication Instructions:  The current medical regimen is effective;  continue present plan and medications.  Follow-Up: Follow up in 6 months with Laura Ingold, NP.  You will receive a letter in the mail 2 months before you are due.  Please call us when you receive this letter to schedule your follow up appointment.  Follow up in 1 year with Dr. Skains.  You will receive a letter in the mail 2 months before you are due.  Please call us when you receive this letter to schedule your follow up appointment.  If you need a refill on your cardiac medications before your next appointment, please call your pharmacy.  Thank you for choosing [facility]!!     
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
## 2017-07-17 DIAGNOSIS — M542 Cervicalgia: Secondary | ICD-10-CM | POA: Diagnosis not present

## 2017-07-17 DIAGNOSIS — M545 Low back pain: Secondary | ICD-10-CM | POA: Diagnosis not present

## 2017-07-17 DIAGNOSIS — R262 Difficulty in walking, not elsewhere classified: Secondary | ICD-10-CM | POA: Diagnosis not present

## 2017-07-17 DIAGNOSIS — M62830 Muscle spasm of back: Secondary | ICD-10-CM | POA: Diagnosis not present

## 2017-07-17 DIAGNOSIS — S39012D Strain of muscle, fascia and tendon of lower back, subsequent encounter: Secondary | ICD-10-CM | POA: Diagnosis not present

## 2017-07-17 DIAGNOSIS — M799 Soft tissue disorder, unspecified: Secondary | ICD-10-CM | POA: Diagnosis not present

## 2017-07-18 ENCOUNTER — Encounter (HOSPITAL_COMMUNITY): Payer: PPO

## 2017-07-19 DIAGNOSIS — M545 Low back pain: Secondary | ICD-10-CM | POA: Diagnosis not present

## 2017-07-19 DIAGNOSIS — S39012D Strain of muscle, fascia and tendon of lower back, subsequent encounter: Secondary | ICD-10-CM | POA: Diagnosis not present

## 2017-07-19 DIAGNOSIS — R262 Difficulty in walking, not elsewhere classified: Secondary | ICD-10-CM | POA: Diagnosis not present

## 2017-07-19 DIAGNOSIS — M62830 Muscle spasm of back: Secondary | ICD-10-CM | POA: Diagnosis not present

## 2017-07-19 DIAGNOSIS — M542 Cervicalgia: Secondary | ICD-10-CM | POA: Diagnosis not present

## 2017-07-19 DIAGNOSIS — M799 Soft tissue disorder, unspecified: Secondary | ICD-10-CM | POA: Diagnosis not present

## 2017-07-20 DIAGNOSIS — R262 Difficulty in walking, not elsewhere classified: Secondary | ICD-10-CM | POA: Diagnosis not present

## 2017-07-20 DIAGNOSIS — M799 Soft tissue disorder, unspecified: Secondary | ICD-10-CM | POA: Diagnosis not present

## 2017-07-20 DIAGNOSIS — M542 Cervicalgia: Secondary | ICD-10-CM | POA: Diagnosis not present

## 2017-07-20 DIAGNOSIS — M545 Low back pain: Secondary | ICD-10-CM | POA: Diagnosis not present

## 2017-07-20 DIAGNOSIS — S39012D Strain of muscle, fascia and tendon of lower back, subsequent encounter: Secondary | ICD-10-CM | POA: Diagnosis not present

## 2017-07-20 DIAGNOSIS — M62830 Muscle spasm of back: Secondary | ICD-10-CM | POA: Diagnosis not present

## 2017-07-23 ENCOUNTER — Encounter (HOSPITAL_COMMUNITY): Payer: PPO

## 2017-07-23 DIAGNOSIS — M62830 Muscle spasm of back: Secondary | ICD-10-CM | POA: Diagnosis not present

## 2017-07-23 DIAGNOSIS — M545 Low back pain: Secondary | ICD-10-CM | POA: Diagnosis not present

## 2017-07-23 DIAGNOSIS — M542 Cervicalgia: Secondary | ICD-10-CM | POA: Diagnosis not present

## 2017-07-23 DIAGNOSIS — M799 Soft tissue disorder, unspecified: Secondary | ICD-10-CM | POA: Diagnosis not present

## 2017-07-23 DIAGNOSIS — S39012D Strain of muscle, fascia and tendon of lower back, subsequent encounter: Secondary | ICD-10-CM | POA: Diagnosis not present

## 2017-07-23 DIAGNOSIS — R262 Difficulty in walking, not elsewhere classified: Secondary | ICD-10-CM | POA: Diagnosis not present

## 2017-07-24 DIAGNOSIS — M5416 Radiculopathy, lumbar region: Secondary | ICD-10-CM | POA: Diagnosis not present

## 2017-07-24 DIAGNOSIS — S3992XD Unspecified injury of lower back, subsequent encounter: Secondary | ICD-10-CM | POA: Diagnosis not present

## 2017-07-25 ENCOUNTER — Other Ambulatory Visit (HOSPITAL_BASED_OUTPATIENT_CLINIC_OR_DEPARTMENT_OTHER): Payer: Self-pay | Admitting: Family Medicine

## 2017-07-25 ENCOUNTER — Encounter (HOSPITAL_COMMUNITY): Payer: PPO

## 2017-07-25 DIAGNOSIS — M5416 Radiculopathy, lumbar region: Secondary | ICD-10-CM

## 2017-07-26 DIAGNOSIS — M545 Low back pain: Secondary | ICD-10-CM | POA: Diagnosis not present

## 2017-07-26 DIAGNOSIS — M542 Cervicalgia: Secondary | ICD-10-CM | POA: Diagnosis not present

## 2017-07-26 DIAGNOSIS — R262 Difficulty in walking, not elsewhere classified: Secondary | ICD-10-CM | POA: Diagnosis not present

## 2017-07-26 DIAGNOSIS — M799 Soft tissue disorder, unspecified: Secondary | ICD-10-CM | POA: Diagnosis not present

## 2017-07-26 DIAGNOSIS — S39012D Strain of muscle, fascia and tendon of lower back, subsequent encounter: Secondary | ICD-10-CM | POA: Diagnosis not present

## 2017-07-26 DIAGNOSIS — M62830 Muscle spasm of back: Secondary | ICD-10-CM | POA: Diagnosis not present

## 2017-07-29 ENCOUNTER — Ambulatory Visit (HOSPITAL_BASED_OUTPATIENT_CLINIC_OR_DEPARTMENT_OTHER)
Admission: RE | Admit: 2017-07-29 | Discharge: 2017-07-29 | Disposition: A | Discharge status: Home / Self Care | Payer: PPO | Source: Ambulatory Visit | Attending: Family Medicine | Admitting: Family Medicine

## 2017-07-29 DIAGNOSIS — M545 Low back pain: Secondary | ICD-10-CM | POA: Diagnosis not present

## 2017-07-29 DIAGNOSIS — M5116 Intervertebral disc disorders with radiculopathy, lumbar region: Secondary | ICD-10-CM | POA: Insufficient documentation

## 2017-07-29 DIAGNOSIS — M5416 Radiculopathy, lumbar region: Secondary | ICD-10-CM

## 2017-07-30 ENCOUNTER — Encounter (HOSPITAL_COMMUNITY): Payer: PPO

## 2017-08-01 ENCOUNTER — Encounter (HOSPITAL_COMMUNITY): Payer: PPO

## 2017-08-02 DIAGNOSIS — M542 Cervicalgia: Secondary | ICD-10-CM | POA: Diagnosis not present

## 2017-08-02 DIAGNOSIS — R262 Difficulty in walking, not elsewhere classified: Secondary | ICD-10-CM | POA: Diagnosis not present

## 2017-08-02 DIAGNOSIS — M62830 Muscle spasm of back: Secondary | ICD-10-CM | POA: Diagnosis not present

## 2017-08-02 DIAGNOSIS — M545 Low back pain: Secondary | ICD-10-CM | POA: Diagnosis not present

## 2017-08-02 DIAGNOSIS — M799 Soft tissue disorder, unspecified: Secondary | ICD-10-CM | POA: Diagnosis not present

## 2017-08-02 DIAGNOSIS — S39012D Strain of muscle, fascia and tendon of lower back, subsequent encounter: Secondary | ICD-10-CM | POA: Diagnosis not present

## 2017-08-06 ENCOUNTER — Encounter (HOSPITAL_COMMUNITY): Payer: PPO

## 2017-08-07 DIAGNOSIS — M62830 Muscle spasm of back: Secondary | ICD-10-CM | POA: Diagnosis not present

## 2017-08-07 DIAGNOSIS — S39012D Strain of muscle, fascia and tendon of lower back, subsequent encounter: Secondary | ICD-10-CM | POA: Diagnosis not present

## 2017-08-07 DIAGNOSIS — M545 Low back pain: Secondary | ICD-10-CM | POA: Diagnosis not present

## 2017-08-07 DIAGNOSIS — R262 Difficulty in walking, not elsewhere classified: Secondary | ICD-10-CM | POA: Diagnosis not present

## 2017-08-07 DIAGNOSIS — M542 Cervicalgia: Secondary | ICD-10-CM | POA: Diagnosis not present

## 2017-08-07 DIAGNOSIS — M799 Soft tissue disorder, unspecified: Secondary | ICD-10-CM | POA: Diagnosis not present

## 2017-08-08 ENCOUNTER — Encounter (HOSPITAL_COMMUNITY): Payer: PPO

## 2017-08-08 DIAGNOSIS — M542 Cervicalgia: Secondary | ICD-10-CM | POA: Diagnosis not present

## 2017-08-08 DIAGNOSIS — M545 Low back pain: Secondary | ICD-10-CM | POA: Diagnosis not present

## 2017-08-08 DIAGNOSIS — S39012D Strain of muscle, fascia and tendon of lower back, subsequent encounter: Secondary | ICD-10-CM | POA: Diagnosis not present

## 2017-08-08 DIAGNOSIS — M799 Soft tissue disorder, unspecified: Secondary | ICD-10-CM | POA: Diagnosis not present

## 2017-08-08 DIAGNOSIS — R262 Difficulty in walking, not elsewhere classified: Secondary | ICD-10-CM | POA: Diagnosis not present

## 2017-08-08 DIAGNOSIS — M62830 Muscle spasm of back: Secondary | ICD-10-CM | POA: Diagnosis not present

## 2017-08-13 DIAGNOSIS — M62830 Muscle spasm of back: Secondary | ICD-10-CM | POA: Diagnosis not present

## 2017-08-13 DIAGNOSIS — M542 Cervicalgia: Secondary | ICD-10-CM | POA: Diagnosis not present

## 2017-08-13 DIAGNOSIS — S39012D Strain of muscle, fascia and tendon of lower back, subsequent encounter: Secondary | ICD-10-CM | POA: Diagnosis not present

## 2017-08-13 DIAGNOSIS — M545 Low back pain: Secondary | ICD-10-CM | POA: Diagnosis not present

## 2017-08-13 DIAGNOSIS — M799 Soft tissue disorder, unspecified: Secondary | ICD-10-CM | POA: Diagnosis not present

## 2017-08-13 DIAGNOSIS — R262 Difficulty in walking, not elsewhere classified: Secondary | ICD-10-CM | POA: Diagnosis not present

## 2017-08-14 DIAGNOSIS — M542 Cervicalgia: Secondary | ICD-10-CM | POA: Diagnosis not present

## 2017-08-14 DIAGNOSIS — M799 Soft tissue disorder, unspecified: Secondary | ICD-10-CM | POA: Diagnosis not present

## 2017-08-14 DIAGNOSIS — S39012D Strain of muscle, fascia and tendon of lower back, subsequent encounter: Secondary | ICD-10-CM | POA: Diagnosis not present

## 2017-08-14 DIAGNOSIS — R262 Difficulty in walking, not elsewhere classified: Secondary | ICD-10-CM | POA: Diagnosis not present

## 2017-08-14 DIAGNOSIS — M62830 Muscle spasm of back: Secondary | ICD-10-CM | POA: Diagnosis not present

## 2017-08-14 DIAGNOSIS — M545 Low back pain: Secondary | ICD-10-CM | POA: Diagnosis not present

## 2017-08-20 DIAGNOSIS — Z6830 Body mass index (BMI) 30.0-30.9, adult: Secondary | ICD-10-CM | POA: Diagnosis not present

## 2017-08-20 DIAGNOSIS — M545 Low back pain: Secondary | ICD-10-CM | POA: Diagnosis not present

## 2017-08-20 DIAGNOSIS — R030 Elevated blood-pressure reading, without diagnosis of hypertension: Secondary | ICD-10-CM | POA: Diagnosis not present

## 2017-08-22 DIAGNOSIS — S39012D Strain of muscle, fascia and tendon of lower back, subsequent encounter: Secondary | ICD-10-CM | POA: Diagnosis not present

## 2017-08-22 DIAGNOSIS — M799 Soft tissue disorder, unspecified: Secondary | ICD-10-CM | POA: Diagnosis not present

## 2017-08-22 DIAGNOSIS — M545 Low back pain: Secondary | ICD-10-CM | POA: Diagnosis not present

## 2017-08-22 DIAGNOSIS — R262 Difficulty in walking, not elsewhere classified: Secondary | ICD-10-CM | POA: Diagnosis not present

## 2017-08-22 DIAGNOSIS — M62830 Muscle spasm of back: Secondary | ICD-10-CM | POA: Diagnosis not present

## 2017-08-22 DIAGNOSIS — M542 Cervicalgia: Secondary | ICD-10-CM | POA: Diagnosis not present

## 2017-08-23 DIAGNOSIS — S39012D Strain of muscle, fascia and tendon of lower back, subsequent encounter: Secondary | ICD-10-CM | POA: Diagnosis not present

## 2017-08-23 DIAGNOSIS — M799 Soft tissue disorder, unspecified: Secondary | ICD-10-CM | POA: Diagnosis not present

## 2017-08-23 DIAGNOSIS — M545 Low back pain: Secondary | ICD-10-CM | POA: Diagnosis not present

## 2017-08-23 DIAGNOSIS — R262 Difficulty in walking, not elsewhere classified: Secondary | ICD-10-CM | POA: Diagnosis not present

## 2017-08-23 DIAGNOSIS — M62830 Muscle spasm of back: Secondary | ICD-10-CM | POA: Diagnosis not present

## 2017-08-23 DIAGNOSIS — M542 Cervicalgia: Secondary | ICD-10-CM | POA: Diagnosis not present

## 2017-08-29 DIAGNOSIS — M542 Cervicalgia: Secondary | ICD-10-CM | POA: Diagnosis not present

## 2017-08-29 DIAGNOSIS — M799 Soft tissue disorder, unspecified: Secondary | ICD-10-CM | POA: Diagnosis not present

## 2017-08-29 DIAGNOSIS — M545 Low back pain: Secondary | ICD-10-CM | POA: Diagnosis not present

## 2017-08-29 DIAGNOSIS — M62830 Muscle spasm of back: Secondary | ICD-10-CM | POA: Diagnosis not present

## 2017-08-29 DIAGNOSIS — S39012D Strain of muscle, fascia and tendon of lower back, subsequent encounter: Secondary | ICD-10-CM | POA: Diagnosis not present

## 2017-08-29 DIAGNOSIS — R262 Difficulty in walking, not elsewhere classified: Secondary | ICD-10-CM | POA: Diagnosis not present

## 2017-08-30 DIAGNOSIS — M62830 Muscle spasm of back: Secondary | ICD-10-CM | POA: Diagnosis not present

## 2017-08-30 DIAGNOSIS — M545 Low back pain: Secondary | ICD-10-CM | POA: Diagnosis not present

## 2017-08-30 DIAGNOSIS — S39012D Strain of muscle, fascia and tendon of lower back, subsequent encounter: Secondary | ICD-10-CM | POA: Diagnosis not present

## 2017-08-30 DIAGNOSIS — R262 Difficulty in walking, not elsewhere classified: Secondary | ICD-10-CM | POA: Diagnosis not present

## 2017-08-30 DIAGNOSIS — M542 Cervicalgia: Secondary | ICD-10-CM | POA: Diagnosis not present

## 2017-08-30 DIAGNOSIS — M799 Soft tissue disorder, unspecified: Secondary | ICD-10-CM | POA: Diagnosis not present

## 2017-09-03 DIAGNOSIS — M545 Low back pain: Secondary | ICD-10-CM | POA: Diagnosis not present

## 2017-09-03 DIAGNOSIS — M799 Soft tissue disorder, unspecified: Secondary | ICD-10-CM | POA: Diagnosis not present

## 2017-09-03 DIAGNOSIS — S39012D Strain of muscle, fascia and tendon of lower back, subsequent encounter: Secondary | ICD-10-CM | POA: Diagnosis not present

## 2017-09-03 DIAGNOSIS — R262 Difficulty in walking, not elsewhere classified: Secondary | ICD-10-CM | POA: Diagnosis not present

## 2017-09-03 DIAGNOSIS — M62830 Muscle spasm of back: Secondary | ICD-10-CM | POA: Diagnosis not present

## 2017-09-03 DIAGNOSIS — M542 Cervicalgia: Secondary | ICD-10-CM | POA: Diagnosis not present

## 2017-09-06 DIAGNOSIS — M62830 Muscle spasm of back: Secondary | ICD-10-CM | POA: Diagnosis not present

## 2017-09-06 DIAGNOSIS — R262 Difficulty in walking, not elsewhere classified: Secondary | ICD-10-CM | POA: Diagnosis not present

## 2017-09-06 DIAGNOSIS — M542 Cervicalgia: Secondary | ICD-10-CM | POA: Diagnosis not present

## 2017-09-06 DIAGNOSIS — S39012D Strain of muscle, fascia and tendon of lower back, subsequent encounter: Secondary | ICD-10-CM | POA: Diagnosis not present

## 2017-09-06 DIAGNOSIS — M799 Soft tissue disorder, unspecified: Secondary | ICD-10-CM | POA: Diagnosis not present

## 2017-09-06 DIAGNOSIS — M545 Low back pain: Secondary | ICD-10-CM | POA: Diagnosis not present

## 2017-09-13 DIAGNOSIS — S39012D Strain of muscle, fascia and tendon of lower back, subsequent encounter: Secondary | ICD-10-CM | POA: Diagnosis not present

## 2017-09-13 DIAGNOSIS — R262 Difficulty in walking, not elsewhere classified: Secondary | ICD-10-CM | POA: Diagnosis not present

## 2017-09-13 DIAGNOSIS — M545 Low back pain: Secondary | ICD-10-CM | POA: Diagnosis not present

## 2017-09-13 DIAGNOSIS — M62830 Muscle spasm of back: Secondary | ICD-10-CM | POA: Diagnosis not present

## 2017-09-13 DIAGNOSIS — M799 Soft tissue disorder, unspecified: Secondary | ICD-10-CM | POA: Diagnosis not present

## 2017-09-13 DIAGNOSIS — M542 Cervicalgia: Secondary | ICD-10-CM | POA: Diagnosis not present

## 2017-09-19 DIAGNOSIS — M799 Soft tissue disorder, unspecified: Secondary | ICD-10-CM | POA: Diagnosis not present

## 2017-09-19 DIAGNOSIS — M545 Low back pain: Secondary | ICD-10-CM | POA: Diagnosis not present

## 2017-09-19 DIAGNOSIS — M542 Cervicalgia: Secondary | ICD-10-CM | POA: Diagnosis not present

## 2017-09-19 DIAGNOSIS — R262 Difficulty in walking, not elsewhere classified: Secondary | ICD-10-CM | POA: Diagnosis not present

## 2017-09-19 DIAGNOSIS — S39012D Strain of muscle, fascia and tendon of lower back, subsequent encounter: Secondary | ICD-10-CM | POA: Diagnosis not present

## 2017-09-19 DIAGNOSIS — M62830 Muscle spasm of back: Secondary | ICD-10-CM | POA: Diagnosis not present

## 2017-10-25 DIAGNOSIS — H25811 Combined forms of age-related cataract, right eye: Secondary | ICD-10-CM | POA: Diagnosis not present

## 2017-10-25 DIAGNOSIS — H2511 Age-related nuclear cataract, right eye: Secondary | ICD-10-CM | POA: Diagnosis not present

## 2017-10-25 DIAGNOSIS — H25011 Cortical age-related cataract, right eye: Secondary | ICD-10-CM | POA: Diagnosis not present

## 2017-11-08 DIAGNOSIS — I779 Disorder of arteries and arterioles, unspecified: Secondary | ICD-10-CM | POA: Diagnosis not present

## 2017-11-08 DIAGNOSIS — I700 Atherosclerosis of aorta: Secondary | ICD-10-CM | POA: Diagnosis not present

## 2017-11-08 DIAGNOSIS — Z79899 Other long term (current) drug therapy: Secondary | ICD-10-CM | POA: Diagnosis not present

## 2017-11-08 DIAGNOSIS — E782 Mixed hyperlipidemia: Secondary | ICD-10-CM | POA: Diagnosis not present

## 2017-11-08 DIAGNOSIS — E7800 Pure hypercholesterolemia, unspecified: Secondary | ICD-10-CM | POA: Diagnosis not present

## 2017-11-08 DIAGNOSIS — J449 Chronic obstructive pulmonary disease, unspecified: Secondary | ICD-10-CM | POA: Diagnosis not present

## 2017-12-17 DIAGNOSIS — Z9189 Other specified personal risk factors, not elsewhere classified: Secondary | ICD-10-CM | POA: Diagnosis not present

## 2017-12-17 DIAGNOSIS — Z1231 Encounter for screening mammogram for malignant neoplasm of breast: Secondary | ICD-10-CM | POA: Diagnosis not present

## 2017-12-17 DIAGNOSIS — N842 Polyp of vagina: Secondary | ICD-10-CM | POA: Diagnosis not present

## 2017-12-17 DIAGNOSIS — Z7251 High risk heterosexual behavior: Secondary | ICD-10-CM | POA: Diagnosis not present

## 2017-12-21 DIAGNOSIS — R928 Other abnormal and inconclusive findings on diagnostic imaging of breast: Secondary | ICD-10-CM | POA: Diagnosis not present

## 2018-01-11 ENCOUNTER — Ambulatory Visit: Payer: PPO

## 2018-01-14 ENCOUNTER — Ambulatory Visit (INDEPENDENT_AMBULATORY_CARE_PROVIDER_SITE_OTHER)
Admission: RE | Admit: 2018-01-14 | Discharge: 2018-01-14 | Disposition: A | Discharge status: Home / Self Care | Payer: PPO | Source: Ambulatory Visit | Attending: Acute Care | Admitting: Acute Care

## 2018-01-14 DIAGNOSIS — F17210 Nicotine dependence, cigarettes, uncomplicated: Secondary | ICD-10-CM | POA: Diagnosis not present

## 2018-01-14 DIAGNOSIS — Z122 Encounter for screening for malignant neoplasm of respiratory organs: Secondary | ICD-10-CM

## 2018-01-22 ENCOUNTER — Other Ambulatory Visit: Payer: Self-pay | Admitting: Acute Care

## 2018-01-22 DIAGNOSIS — Z122 Encounter for screening for malignant neoplasm of respiratory organs: Secondary | ICD-10-CM

## 2018-01-22 DIAGNOSIS — F17210 Nicotine dependence, cigarettes, uncomplicated: Principal | ICD-10-CM

## 2018-01-24 ENCOUNTER — Ambulatory Visit: Payer: PPO | Admitting: Cardiology

## 2018-01-24 ENCOUNTER — Encounter: Payer: Self-pay | Admitting: Cardiology

## 2018-01-24 VITALS — BP 124/74 | HR 69 | Ht 64.0 in | Wt 137.0 lb

## 2018-01-24 DIAGNOSIS — E7800 Pure hypercholesterolemia, unspecified: Secondary | ICD-10-CM

## 2018-01-24 DIAGNOSIS — Z720 Tobacco use: Secondary | ICD-10-CM

## 2018-01-24 DIAGNOSIS — I200 Unstable angina: Secondary | ICD-10-CM

## 2018-01-24 DIAGNOSIS — F329 Major depressive disorder, single episode, unspecified: Secondary | ICD-10-CM | POA: Diagnosis not present

## 2018-01-24 DIAGNOSIS — I700 Atherosclerosis of aorta: Secondary | ICD-10-CM | POA: Diagnosis not present

## 2018-01-24 MED ORDER — ISOSORBIDE MONONITRATE ER 30 MG PO TB24: 15.0000 mg | ORAL_TABLET | Freq: Every day | ORAL | 3 refills | Status: DC

## 2018-01-24 NOTE — Patient Instructions (Addendum)
Medication Instructions:  START IMDUR 30 MG TABLET WITH DIRECTIONS TO READ: TAKE 1/2 TABLET DAILY = 15 MG DAILY; RX HAS BEEN SENT IN  Labwork: TODAY BMET, CBC   Testing/Procedures: Your physician has requested that you have a cardiac catheterization. Cardiac catheterization is used to diagnose and/or treat various heart conditions. Doctors may recommend this procedure for a number of different reasons. The most common reason is to evaluate chest pain. Chest pain can be a symptom of coronary artery disease (CAD), and cardiac catheterization can show whether plaque is narrowing or blocking your heart's arteries. This procedure is also used to evaluate the valves, as well as measure the blood flow and oxygen levels in different parts of your heart. For further information please visit [email or web address]. Please follow instruction sheet, as given.    Follow-Up: 02/14/18 @ 11:30 WITH Cecilie Kicks, NP FOR YOUR POST PROCEDURE FOLLOW UP APPT   Any Other Special Instructions Will Be Listed Below (If Applicable).  If you need a refill on your cardiac medications before your next appointment, please call your pharmacy.     [facility] [place], [place] [address] Dept: [phone] Loc: [place]  01/24/2018  You are scheduled for a Cardiac Catheterization on Wednesday, September 11 with Dr. Lauree Chandler @ 1 PM.  1. Please arrive at the [facility] (Main Entrance A) at [facility]: [place], [place] [address] at 11:00 AM (This time is two hours before your procedure to ensure your preparation). Free valet parking service is available.   Special note: Every effort is made to have your procedure done on time. Please understand that emergencies sometimes delay scheduled procedures.  2. Diet: Do not eat solid foods after midnight.  The patient may  have clear liquids until 5am upon the day of the procedure.  3. Labs: You will need to have blood drawn on Thursday, September 5 at [facility]. [address]  Open: 7:30am - 5pm    Phone: [phone]. You do not need to be fasting.  4. Medication instructions in preparation for your procedure:    On the morning of your procedure, take your Aspirin and PLAVIX and any    morning medicines NOT listed above.  You may use sips of water.   Contrast Allergy: No  5. Plan for one night stay--bring personal belongings. 6. Bring a current list of your medications and current insurance cards. 7. You MUST have a responsible person to drive you home. 8. Someone MUST be with you the first 24 hours after you arrive home or your discharge will be delayed. 9. Please wear clothes that are easy to get on and off and wear slip-on shoes.  Thank you for allowing [place] to care for you!   -- [facility]

## 2018-01-24 NOTE — H&P (View-Only) (Signed)
Cardiology Office Note   Date:  01/24/2018   ID:  Casey Kirk, DOB 12-Sep-1945, MRN [id number]  PCP:  Casey Cruel, MD  Cardiologist:  Dr. Marlou Porch    No chief complaint on file.     History of Present Illness: Casey Kirk is a 72 y.o. female who presents for increasing chest soreness and dyspnea.  The soreness is just like before.   COPD, bipolar disorder, GERD, IBS, hyperlipidemia, thyroid disease with coronary calcification on CT scan screening with symptoms of fatigue and chest pain.  She went forward with heart catheterization on 03/16/17 showed an 80% mid LAD stenosis FFR 0.7.  DES x1 PCI performed. She had 10% stenosis of RCA.   She did well.  Same day discharge.  She was doing quite well at her last clinic visit on 03/29/17.  Still having difficulty with smoking.  Bilateral upper leg pain with ambulation.  ABIs were normal on 05/09/17.   Carotid Dopplers on 04/17/17: 1. Mild amount of calcified plaque at the level of the left carotid bulb and proximal ICA with estimated left ICA stenosis of less than 50%. 2. Minimal plaque at the level of the right carotid bulb. No evidence of right ICA stenosis.  Today as above with increased chest soreness, it had gone completely away after previous stents.   Now has returned - she does have COPD so she is never sure if cardiac or pulmonary.   But this soreness increases is with activity and she is more fatigued.  She is decreasing activity due to the symptoms.  BP is stable   She has not tried NTG.  Prior to heart cath she had normal nuc..    She does continue to smoke.  She is now on Chantix and is deciding on a stop date she is finding it extremely hard to stop smoking.      Past Medical History:  Diagnosis Date  . Allergic rhinitis   . Aortic atherosclerosis ([place]) 08/17/2016  . ARTHRITIS, HX OF 01/09/2007  . BIPOLAR II DISORDER 03/17/2008  . Carcinoma of vaginal vault (Casey Kirk)   . COPD 12/21/2006  . COPD (chronic  obstructive pulmonary disease) ([place]) 12/21/2006   Emphysema on CT 06/2016 02/2016 FEV1 of 98%    . Coronary artery calcification 08/17/2016  . Coronary artery disease involving native coronary artery of native heart with unstable angina pectoris ([place])   . Dysphasia 05/10/2011  . Family history of early CAD 08/17/2016  . GERD (gastroesophageal reflux disease)   . GOUT 01/09/2007  . Hx of colonic polyps   . Hypothyroidism 10/17/2010  . IBS (irritable bowel syndrome)   . Osteoporosis   . Sinusitis   . Thyroid disease   . TINEA CORPORIS 10/06/2009  . Tobacco abuse 05/10/2011    Past Surgical History:  Procedure Laterality Date  . ABDOMINAL HYSTERECTOMY     CA cells removed end of vagina  . BREAST SURGERY     biopsy  . CORONARY STENT INTERVENTION N/A 03/16/2017   Procedure: CORONARY STENT INTERVENTION;  Surgeon: Burnell Blanks, MD;  Location: [facility];  Service: Cardiovascular;  Laterality: N/A;  . INTRAVASCULAR PRESSURE WIRE/FFR STUDY N/A 03/16/2017   Procedure: INTRAVASCULAR PRESSURE WIRE/FFR STUDY;  Surgeon: Burnell Blanks, MD;  Location: [facility];  Service: Cardiovascular;  Laterality: N/A;  . LEFT HEART CATH AND CORONARY ANGIOGRAPHY N/A 03/16/2017   Procedure: LEFT HEART CATH AND CORONARY ANGIOGRAPHY;  Surgeon: Burnell Blanks, MD;  Location: [facility];  Service: Cardiovascular;  Laterality: N/A;  . vaginal carcinoma surgery       Current Outpatient Medications  Medication Sig Dispense Refill  . albuterol (PROVENTIL HFA;VENTOLIN HFA) 108 (90 Base) MCG/ACT inhaler Inhale 2 puffs into the lungs every 6 (six) hours as needed for wheezing or shortness of breath. 1 Inhaler 5  . aspirin 81 MG tablet Take 81 mg by mouth daily.      . calcium citrate-vitamin D (CITRACAL+D) 315-200 MG-UNIT per tablet Take 1 tablet by mouth daily.     Marland Kitchen CALCIUM/MAGNESIUM/ZINC FORMULA PO Take 1 tablet by mouth 3 (three) times a week.    . cholecalciferol  (VITAMIN D) 1000 units tablet Take 1,000 Units by mouth daily.    . clopidogrel (PLAVIX) 75 MG tablet Take 1 tablet (75 mg total) daily by mouth. 90 tablet 3  . diphenhydrAMINE (BENADRYL) 25 mg capsule Take 25 mg by mouth daily as needed for allergies.    . fish oil-omega-3 fatty acids 1000 MG capsule Take 1 g by mouth 2 (two) times a week.     . fluticasone (FLONASE) 50 MCG/ACT nasal spray Place 2 sprays into both nostrils daily as needed for allergies or rhinitis.    Marland Kitchen isosorbide mononitrate (IMDUR) 30 MG 24 hr tablet Take 0.5 tablets (15 mg total) by mouth daily. 90 tablet 3  . Melatonin 1 MG TABS Take 3 mg by mouth at bedtime.     . Multiple Vitamin (MULTIVITAMIN) tablet Take 1 tablet by mouth daily.      . nitroGLYCERIN (NITROSTAT) 0.4 MG SL tablet Place 1 tablet (0.4 mg total) under the tongue every 5 (five) minutes as needed for chest pain. 25 tablet 12  . pantoprazole (PROTONIX) 40 MG tablet Take 1 tablet (40 mg total) by mouth daily. 30 tablet 6  . rosuvastatin (CRESTOR) 20 MG tablet Take 1 tablet daily by mouth.    Marland Kitchen tiZANidine (ZANAFLEX) 2 MG tablet Take 2 mg by mouth every 6 (six) hours as needed for muscle spasms.    . traZODone (DESYREL) 50 MG tablet Take 50 mg by mouth at bedtime.    . varenicline (CHANTIX STARTING MONTH PAK) 0.5 MG X 11 & 1 MG X 42 tablet TAKE AS DIRECTED 53 tablet 0  . vitamin E 100 UNIT capsule Take 100 Units by mouth daily.      No current facility-administered medications for this visit.     Allergies:   Hydrocodone-acetaminophen; Latex; and Pneumococcal polysaccharide vaccine    Social History:  The patient  reports that she has been smoking cigarettes. She has a 20.00 pack-year smoking history. She has never used smokeless tobacco. She reports that she drinks alcohol. She reports that she does not use drugs.   Family History:  The patient's family history includes Alcohol abuse in her other; Arthritis in her other; COPD in her mother; Cancer in her  other; Colon polyps in her brother and mother; Diabetes in her other; Heart attack (age of onset: 51) in her brother; Heart disease in her other; Hyperlipidemia in her other; Hypertension in her other; Sudden death in her other; Tremor in her mother.    ROS:  General:no colds or fevers, no weight changes Skin:no rashes or ulcers HEENT:no blurred vision, no congestion- voice with laryngitis.    CV:see HPI PUL:see HPI GI:no diarrhea constipation or melena, no indigestion GU:no hematuria, no dysuria MS:no joint pain, no claudication Neuro:no syncope, no lightheadedness Endo:no diabetes, no thyroid disease  Wt Readings from Last 3 Encounters:  01/24/18 137 lb (62.1 kg)  07/16/17 143 lb 4.8 oz (65 kg)  05/30/17 142 lb 3.2 oz (64.5 kg)     PHYSICAL EXAM: VS:  BP 124/74   Pulse 69   Ht 5\' 4"  (1.626 m)   Wt 137 lb (62.1 kg)   BMI 23.52 kg/m  , BMI Body mass index is 23.52 kg/m. General:Pleasant affect, NAD Skin:Warm and dry, brisk capillary refill HEENT:normocephalic, sclera clear, mucus membranes moist Neck:supple, no JVD, no bruits  Heart:S1S2 RRR without murmur, gallup, rub or click Lungs:clear without rales, rhonchi, or wheezes AJO:INOM, non tender, + BS, do not palpate liver spleen or masses Ext:no lower ext edema, 2+ pedal pulses, 2+ radial pulses Neuro:alert and oriented X 3, MAE, follows commands, + facial symmetry    EKG:  EKG is ordered today. The ekg ordered today demonstrates SR with no acute changes.  Low voltage QRS     Recent Labs: 03/12/2017: ALT 15; BUN 8; Creatinine, Ser 0.80; Hemoglobin 14.7; Platelets 237; Potassium 4.4; Sodium 142    Lipid Panel    Component Value Date/Time   CHOL 107 05/10/2017 0832   TRIG 94 05/10/2017 0832   HDL 51 05/10/2017 0832   CHOLHDL 2.1 05/10/2017 0832   CHOLHDL 5 09/22/2010 0843   VLDL 20.6 09/22/2010 0843   LDLCALC 37 05/10/2017 0832       Other studies Reviewed: Additional studies/ records that were reviewed  today include: . Cath 03/16/17  Mid RCA lesion, 10 %stenosed.  A STENT PROMUS PREM MR 2.75X16 drug eluting stent was successfully placed.  Mid LAD lesion, 80 %stenosed.  Post intervention, there is a 0% residual stenosis.  The left ventricular systolic function is normal.  LV end diastolic pressure is normal.  The left ventricular ejection fraction is greater than 65% by visual estimate.  There is no mitral valve regurgitation.   1. Severe single vessel CAD 2. Severe stenosis mid LAD with FFR of 0.70. 3. Successful PTCA/DES x 1 mid LAD 4. Mild plaque RCA 5. Normal LV systolic function  Recommendations: Will continue DAPT with ASA and Plavix for one year. Start statin. Same day PCI discharge.    ASSESSMENT AND PLAN:  1.  Crescendo angina - same symptoms as prior to cath.  Will add Imdur 15 mg daily and after discussion with Dr. Acie Fredrickson DOD we believe her symptoms warrant cardiac cath.  Last nuc did not reveal severity of her disease.   Discussed with pt and she is agreeable.    The patient understands that risks included but are not limited to stroke (1 in 1000), death (1 in 31), kidney failure [usually temporary] (1 in 500), bleeding (1 in 200), allergic reaction [possibly serious] (1 in 200).   2.  HLD on statin. Continue  3.  Tobacco use, continue with cessation.    4.  COPD  Per pulmonary recent CT of chest stable.     Current medicines are reviewed with the patient today.  The patient Has no concerns regarding medicines.  The following changes have been made:  See above Labs/ tests ordered today include:see above  Disposition:   FU:  see above  Signed, Cecilie Kicks, NP  01/24/2018 4:56 PM    [facility], [place], [place], [place] Phone: [phone]; Fax: [phone]

## 2018-01-24 NOTE — Progress Notes (Signed)
Cardiology Office Note   Date:  01/24/2018   ID:  Casey Kirk, DOB 1945-07-26, MRN [id number]  PCP:  Lawerance Cruel, MD  Cardiologist:  Dr. Marlou Porch    No chief complaint on file.     History of Present Illness: Casey Kirk is a 72 y.o. female who presents for increasing chest soreness and dyspnea.  The soreness is just like before.   COPD, bipolar disorder, GERD, IBS, hyperlipidemia, thyroid disease with coronary calcification on CT scan screening with symptoms of fatigue and chest pain.  She went forward with heart catheterization on 03/16/17 showed an 80% mid LAD stenosis FFR 0.7.  DES x1 PCI performed. She had 10% stenosis of RCA.   She did well.  Same day discharge.  She was doing quite well at her last clinic visit on 03/29/17.  Still having difficulty with smoking.  Bilateral upper leg pain with ambulation.  ABIs were normal on 05/09/17.   Carotid Dopplers on 04/17/17: 1. Mild amount of calcified plaque at the level of the left carotid bulb and proximal ICA with estimated left ICA stenosis of less than 50%. 2. Minimal plaque at the level of the right carotid bulb. No evidence of right ICA stenosis.  Today as above with increased chest soreness, it had gone completely away after previous stents.   Now has returned - she does have COPD so she is never sure if cardiac or pulmonary.   But this soreness increases is with activity and she is more fatigued.  She is decreasing activity due to the symptoms.  BP is stable   She has not tried NTG.  Prior to heart cath she had normal nuc..    She does continue to smoke.  She is now on Chantix and is deciding on a stop date she is finding it extremely hard to stop smoking.      Past Medical History:  Diagnosis Date  . Allergic rhinitis   . Aortic atherosclerosis ([place]) 08/17/2016  . ARTHRITIS, HX OF 01/09/2007  . BIPOLAR II DISORDER 03/17/2008  . Carcinoma of vaginal vault ([place])   . COPD 12/21/2006  . COPD (chronic  obstructive pulmonary disease) ([place]) 12/21/2006   Emphysema on CT 06/2016 02/2016 FEV1 of 98%    . Coronary artery calcification 08/17/2016  . Coronary artery disease involving native coronary artery of native heart with unstable angina pectoris ([place])   . Dysphasia 05/10/2011  . Family history of early CAD 08/17/2016  . GERD (gastroesophageal reflux disease)   . GOUT 01/09/2007  . Hx of colonic polyps   . Hypothyroidism 10/17/2010  . IBS (irritable bowel syndrome)   . Osteoporosis   . Sinusitis   . Thyroid disease   . TINEA CORPORIS 10/06/2009  . Tobacco abuse 05/10/2011    Past Surgical History:  Procedure Laterality Date  . ABDOMINAL HYSTERECTOMY     CA cells removed end of vagina  . BREAST SURGERY     biopsy  . CORONARY STENT INTERVENTION N/A 03/16/2017   Procedure: CORONARY STENT INTERVENTION;  Surgeon: Burnell Blanks, MD;  Location: [facility];  Service: Cardiovascular;  Laterality: N/A;  . INTRAVASCULAR PRESSURE WIRE/FFR STUDY N/A 03/16/2017   Procedure: INTRAVASCULAR PRESSURE WIRE/FFR STUDY;  Surgeon: Burnell Blanks, MD;  Location: [facility];  Service: Cardiovascular;  Laterality: N/A;  . LEFT HEART CATH AND CORONARY ANGIOGRAPHY N/A 03/16/2017   Procedure: LEFT HEART CATH AND CORONARY ANGIOGRAPHY;  Surgeon: Burnell Blanks, MD;  Location: [facility];  Service: Cardiovascular;  Laterality: N/A;  . vaginal carcinoma surgery       Current Outpatient Medications  Medication Sig Dispense Refill  . albuterol (PROVENTIL HFA;VENTOLIN HFA) 108 (90 Base) MCG/ACT inhaler Inhale 2 puffs into the lungs every 6 (six) hours as needed for wheezing or shortness of breath. 1 Inhaler 5  . aspirin 81 MG tablet Take 81 mg by mouth daily.      . calcium citrate-vitamin D (CITRACAL+D) 315-200 MG-UNIT per tablet Take 1 tablet by mouth daily.     Marland Kitchen CALCIUM/MAGNESIUM/ZINC FORMULA PO Take 1 tablet by mouth 3 (three) times a week.    . cholecalciferol  (VITAMIN D) 1000 units tablet Take 1,000 Units by mouth daily.    . clopidogrel (PLAVIX) 75 MG tablet Take 1 tablet (75 mg total) daily by mouth. 90 tablet 3  . diphenhydrAMINE (BENADRYL) 25 mg capsule Take 25 mg by mouth daily as needed for allergies.    . fish oil-omega-3 fatty acids 1000 MG capsule Take 1 g by mouth 2 (two) times a week.     . fluticasone (FLONASE) 50 MCG/ACT nasal spray Place 2 sprays into both nostrils daily as needed for allergies or rhinitis.    Marland Kitchen isosorbide mononitrate (IMDUR) 30 MG 24 hr tablet Take 0.5 tablets (15 mg total) by mouth daily. 90 tablet 3  . Melatonin 1 MG TABS Take 3 mg by mouth at bedtime.     . Multiple Vitamin (MULTIVITAMIN) tablet Take 1 tablet by mouth daily.      . nitroGLYCERIN (NITROSTAT) 0.4 MG SL tablet Place 1 tablet (0.4 mg total) under the tongue every 5 (five) minutes as needed for chest pain. 25 tablet 12  . pantoprazole (PROTONIX) 40 MG tablet Take 1 tablet (40 mg total) by mouth daily. 30 tablet 6  . rosuvastatin (CRESTOR) 20 MG tablet Take 1 tablet daily by mouth.    Marland Kitchen tiZANidine (ZANAFLEX) 2 MG tablet Take 2 mg by mouth every 6 (six) hours as needed for muscle spasms.    . traZODone (DESYREL) 50 MG tablet Take 50 mg by mouth at bedtime.    . varenicline (CHANTIX STARTING MONTH PAK) 0.5 MG X 11 & 1 MG X 42 tablet TAKE AS DIRECTED 53 tablet 0  . vitamin E 100 UNIT capsule Take 100 Units by mouth daily.      No current facility-administered medications for this visit.     Allergies:   Hydrocodone-acetaminophen; Latex; and Pneumococcal polysaccharide vaccine    Social History:  The patient  reports that she has been smoking cigarettes. She has a 20.00 pack-year smoking history. She has never used smokeless tobacco. She reports that she drinks alcohol. She reports that she does not use drugs.   Family History:  The patient's family history includes Alcohol abuse in her other; Arthritis in her other; COPD in her mother; Cancer in her  other; Colon polyps in her brother and mother; Diabetes in her other; Heart attack (age of onset: 40) in her brother; Heart disease in her other; Hyperlipidemia in her other; Hypertension in her other; Sudden death in her other; Tremor in her mother.    ROS:  General:no colds or fevers, no weight changes Skin:no rashes or ulcers HEENT:no blurred vision, no congestion- voice with laryngitis.    CV:see HPI PUL:see HPI GI:no diarrhea constipation or melena, no indigestion GU:no hematuria, no dysuria MS:no joint pain, no claudication Neuro:no syncope, no lightheadedness Endo:no diabetes, no thyroid disease  Wt Readings from Last 3 Encounters:  01/24/18 137 lb (62.1 kg)  07/16/17 143 lb 4.8 oz (65 kg)  05/30/17 142 lb 3.2 oz (64.5 kg)     PHYSICAL EXAM: VS:  BP 124/74   Pulse 69   Ht 5\' 4"  (1.626 m)   Wt 137 lb (62.1 kg)   BMI 23.52 kg/m  , BMI Body mass index is 23.52 kg/m. General:Pleasant affect, NAD Skin:Warm and dry, brisk capillary refill HEENT:normocephalic, sclera clear, mucus membranes moist Neck:supple, no JVD, no bruits  Heart:S1S2 RRR without murmur, gallup, rub or click Lungs:clear without rales, rhonchi, or wheezes BMW:UXLK, non tender, + BS, do not palpate liver spleen or masses Ext:no lower ext edema, 2+ pedal pulses, 2+ radial pulses Neuro:alert and oriented X 3, MAE, follows commands, + facial symmetry    EKG:  EKG is ordered today. The ekg ordered today demonstrates SR with no acute changes.  Low voltage QRS     Recent Labs: 03/12/2017: ALT 15; BUN 8; Creatinine, Ser 0.80; Hemoglobin 14.7; Platelets 237; Potassium 4.4; Sodium 142    Lipid Panel    Component Value Date/Time   CHOL 107 05/10/2017 0832   TRIG 94 05/10/2017 0832   HDL 51 05/10/2017 0832   CHOLHDL 2.1 05/10/2017 0832   CHOLHDL 5 09/22/2010 0843   VLDL 20.6 09/22/2010 0843   LDLCALC 37 05/10/2017 0832       Other studies Reviewed: Additional studies/ records that were reviewed  today include: . Cath 03/16/17  Mid RCA lesion, 10 %stenosed.  A STENT PROMUS PREM MR 2.75X16 drug eluting stent was successfully placed.  Mid LAD lesion, 80 %stenosed.  Post intervention, there is a 0% residual stenosis.  The left ventricular systolic function is normal.  LV end diastolic pressure is normal.  The left ventricular ejection fraction is greater than 65% by visual estimate.  There is no mitral valve regurgitation.   1. Severe single vessel CAD 2. Severe stenosis mid LAD with FFR of 0.70. 3. Successful PTCA/DES x 1 mid LAD 4. Mild plaque RCA 5. Normal LV systolic function  Recommendations: Will continue DAPT with ASA and Plavix for one year. Start statin. Same day PCI discharge.    ASSESSMENT AND PLAN:  1.  Crescendo angina - same symptoms as prior to cath.  Will add Imdur 15 mg daily and after discussion with Dr. Acie Fredrickson DOD we believe her symptoms warrant cardiac cath.  Last nuc did not reveal severity of her disease.   Discussed with pt and she is agreeable.    The patient understands that risks included but are not limited to stroke (1 in 1000), death (1 in 58), kidney failure [usually temporary] (1 in 500), bleeding (1 in 200), allergic reaction [possibly serious] (1 in 200).   2.  HLD on statin. Continue  3.  Tobacco use, continue with cessation.    4.  COPD  Per pulmonary recent CT of chest stable.     Current medicines are reviewed with the patient today.  The patient Has no concerns regarding medicines.  The following changes have been made:  See above Labs/ tests ordered today include:see above  Disposition:   FU:  see above  Signed, Cecilie Kicks, NP  01/24/2018 4:56 PM    [facility] [place], [place], [place], [place] Phone: [phone]; Fax: [phone]

## 2018-01-25 LAB — CBC
Hematocrit: 43.4 % (ref 34.0–46.6)
Hemoglobin: 14.1 g/dL (ref 11.1–15.9)
MCH: 29 pg (ref 26.6–33.0)
MCHC: 32.5 g/dL (ref 31.5–35.7)
MCV: 89 fL (ref 79–97)
Platelets: 216 10*3/uL (ref 150–450)
RBC: 4.86 x10E6/uL (ref 3.77–5.28)
RDW: 13.8 % (ref 12.3–15.4)
WBC: 9.9 10*3/uL (ref 3.4–10.8)

## 2018-01-25 LAB — BASIC METABOLIC PANEL
BUN/Creatinine Ratio: 12 (ref 12–28)
BUN: 10 mg/dL (ref 8–27)
CO2: 25 mmol/L (ref 20–29)
Calcium: 9.5 mg/dL (ref 8.7–10.3)
Chloride: 106 mmol/L (ref 96–106)
Creatinine, Ser: 0.83 mg/dL (ref 0.57–1.00)
GFR calc Af Amer: 83 mL/min/{1.73_m2} (ref >59)
GFR calc non Af Amer: 72 mL/min/{1.73_m2} (ref >59)
Glucose: 75 mg/dL (ref 65–99)
Potassium: 4.4 mmol/L (ref 3.5–5.2)
Sodium: 146 mmol/L — ABNORMAL HIGH (ref 134–144)

## 2018-01-29 ENCOUNTER — Telehealth: Payer: Self-pay | Admitting: *Deleted

## 2018-01-29 NOTE — Telephone Encounter (Addendum)
Pt contacted pre-catheterization scheduled at [facility] for: Wednesday January 30, 2018 1 PM Verified arrival time and place: [facility] Entrance A at: 11 AM  No solid food after midnight prior to cath, clear liquids until 5 AM day of procedure. Verified no diabetes medications.  AM meds can be  taken pre-cath with sip of water including: ASA 81 mg Clopidogrel 75 mg  Confirmed patient has responsible person to drive home post procedure and for 24 hours after you arrive home : yes

## 2018-01-30 ENCOUNTER — Ambulatory Visit (HOSPITAL_COMMUNITY)
Admission: RE | Admit: 2018-01-30 | Discharge: 2018-01-30 | Disposition: A | Discharge status: Home / Self Care | Payer: PPO | Source: Ambulatory Visit | Attending: Cardiovascular Disease | Admitting: Cardiovascular Disease

## 2018-01-30 ENCOUNTER — Encounter (HOSPITAL_COMMUNITY)
Admission: RE | Disposition: A | Discharge status: Home / Self Care | Payer: Self-pay | Source: Ambulatory Visit | Attending: Cardiovascular Disease

## 2018-01-30 DIAGNOSIS — Z7951 Long term (current) use of inhaled steroids: Secondary | ICD-10-CM | POA: Insufficient documentation

## 2018-01-30 DIAGNOSIS — K589 Irritable bowel syndrome without diarrhea: Secondary | ICD-10-CM | POA: Diagnosis not present

## 2018-01-30 DIAGNOSIS — K219 Gastro-esophageal reflux disease without esophagitis: Secondary | ICD-10-CM | POA: Insufficient documentation

## 2018-01-30 DIAGNOSIS — F17210 Nicotine dependence, cigarettes, uncomplicated: Secondary | ICD-10-CM | POA: Insufficient documentation

## 2018-01-30 DIAGNOSIS — M810 Age-related osteoporosis without current pathological fracture: Secondary | ICD-10-CM | POA: Insufficient documentation

## 2018-01-30 DIAGNOSIS — Z7902 Long term (current) use of antithrombotics/antiplatelets: Secondary | ICD-10-CM | POA: Diagnosis not present

## 2018-01-30 DIAGNOSIS — Z83438 Family history of other disorder of lipoprotein metabolism and other lipidemia: Secondary | ICD-10-CM | POA: Diagnosis not present

## 2018-01-30 DIAGNOSIS — Z8249 Family history of ischemic heart disease and other diseases of the circulatory system: Secondary | ICD-10-CM | POA: Insufficient documentation

## 2018-01-30 DIAGNOSIS — M109 Gout, unspecified: Secondary | ICD-10-CM | POA: Insufficient documentation

## 2018-01-30 DIAGNOSIS — Z885 Allergy status to narcotic agent status: Secondary | ICD-10-CM | POA: Diagnosis not present

## 2018-01-30 DIAGNOSIS — E039 Hypothyroidism, unspecified: Secondary | ICD-10-CM | POA: Insufficient documentation

## 2018-01-30 DIAGNOSIS — E785 Hyperlipidemia, unspecified: Secondary | ICD-10-CM | POA: Insufficient documentation

## 2018-01-30 DIAGNOSIS — F3181 Bipolar II disorder: Secondary | ICD-10-CM | POA: Diagnosis not present

## 2018-01-30 DIAGNOSIS — R072 Precordial pain: Secondary | ICD-10-CM

## 2018-01-30 DIAGNOSIS — I25110 Atherosclerotic heart disease of native coronary artery with unstable angina pectoris: Secondary | ICD-10-CM | POA: Insufficient documentation

## 2018-01-30 DIAGNOSIS — M1990 Unspecified osteoarthritis, unspecified site: Secondary | ICD-10-CM | POA: Insufficient documentation

## 2018-01-30 DIAGNOSIS — Z955 Presence of coronary angioplasty implant and graft: Secondary | ICD-10-CM | POA: Diagnosis not present

## 2018-01-30 DIAGNOSIS — Z7982 Long term (current) use of aspirin: Secondary | ICD-10-CM | POA: Insufficient documentation

## 2018-01-30 DIAGNOSIS — Z91040 Latex allergy status: Secondary | ICD-10-CM | POA: Diagnosis not present

## 2018-01-30 DIAGNOSIS — J449 Chronic obstructive pulmonary disease, unspecified: Secondary | ICD-10-CM | POA: Insufficient documentation

## 2018-01-30 HISTORY — PX: LEFT HEART CATH AND CORONARY ANGIOGRAPHY: CATH118249

## 2018-01-30 SURGERY — LEFT HEART CATH AND CORONARY ANGIOGRAPHY
Anesthesia: LOCAL

## 2018-01-30 MED ORDER — VERAPAMIL HCL 2.5 MG/ML IV SOLN
INTRAVENOUS | Status: AC
  Filled 2018-01-30: qty 2

## 2018-01-30 MED ORDER — MIDAZOLAM HCL 2 MG/2ML IJ SOLN
INTRAMUSCULAR | Status: DC | PRN
  Administered 2018-01-30: 0.5 mg via INTRAVENOUS
  Administered 2018-01-30: 1 mg via INTRAVENOUS
  Administered 2018-01-30: 0.5 mg via INTRAVENOUS

## 2018-01-30 MED ORDER — SODIUM CHLORIDE 0.9 % IV SOLN: 250.0000 mL | INTRAVENOUS | Status: DC | PRN

## 2018-01-30 MED ORDER — ASPIRIN 81 MG PO CHEW: 81.0000 mg | CHEWABLE_TABLET | ORAL | Status: DC

## 2018-01-30 MED ORDER — SODIUM CHLORIDE 0.9% FLUSH: 3.0000 mL | INTRAVENOUS | Status: DC | PRN

## 2018-01-30 MED ORDER — SODIUM CHLORIDE 0.9 % WEIGHT BASED INFUSION
3.0000 mL/kg/h | INTRAVENOUS | Status: AC
  Administered 2018-01-30: 3 mL/kg/h via INTRAVENOUS

## 2018-01-30 MED ORDER — VERAPAMIL HCL 2.5 MG/ML IV SOLN
INTRAVENOUS | Status: DC | PRN
  Administered 2018-01-30: 10 mL via INTRA_ARTERIAL

## 2018-01-30 MED ORDER — SODIUM CHLORIDE 0.9% FLUSH: 3.0000 mL | Freq: Two times a day (BID) | INTRAVENOUS | Status: DC

## 2018-01-30 MED ORDER — HEPARIN SODIUM (PORCINE) 1000 UNIT/ML IJ SOLN
INTRAMUSCULAR | Status: AC
  Filled 2018-01-30: qty 1

## 2018-01-30 MED ORDER — MIDAZOLAM HCL 2 MG/2ML IJ SOLN
INTRAMUSCULAR | Status: AC
  Filled 2018-01-30: qty 2

## 2018-01-30 MED ORDER — HEPARIN SODIUM (PORCINE) 1000 UNIT/ML IJ SOLN
INTRAMUSCULAR | Status: DC | PRN
  Administered 2018-01-30: 3000 [IU] via INTRAVENOUS

## 2018-01-30 MED ORDER — FENTANYL CITRATE (PF) 100 MCG/2ML IJ SOLN
INTRAMUSCULAR | Status: DC | PRN
  Administered 2018-01-30 (×3): 25 ug via INTRAVENOUS

## 2018-01-30 MED ORDER — LIDOCAINE HCL (PF) 1 % IJ SOLN
INTRAMUSCULAR | Status: AC
  Filled 2018-01-30: qty 30

## 2018-01-30 MED ORDER — SODIUM CHLORIDE 0.9 % WEIGHT BASED INFUSION: 1.0000 mL/kg/h | INTRAVENOUS | Status: DC

## 2018-01-30 MED ORDER — FENTANYL CITRATE (PF) 100 MCG/2ML IJ SOLN
INTRAMUSCULAR | Status: AC
  Filled 2018-01-30: qty 2

## 2018-01-30 MED ORDER — LIDOCAINE HCL (PF) 1 % IJ SOLN
INTRAMUSCULAR | Status: DC | PRN
  Administered 2018-01-30: 2 mL

## 2018-01-30 MED ORDER — IOHEXOL 350 MG/ML SOLN
INTRAVENOUS | Status: DC | PRN
  Administered 2018-01-30: 75 mL via INTRA_ARTERIAL

## 2018-01-30 MED ORDER — HEPARIN (PORCINE) IN NACL 1000-0.9 UT/500ML-% IV SOLN
INTRAVENOUS | Status: AC
  Filled 2018-01-30: qty 1000

## 2018-01-30 MED ORDER — HEPARIN (PORCINE) IN NACL 1000-0.9 UT/500ML-% IV SOLN
INTRAVENOUS | Status: DC | PRN
  Administered 2018-01-30 (×2): 500 mL

## 2018-01-30 SURGICAL SUPPLY — 10 items
CATH 5FR JL3.5 JR4 ANG PIG MP (CATHETERS) ×2 IMPLANT
DEVICE RAD COMP TR BAND LRG (VASCULAR PRODUCTS) ×1 IMPLANT
GLIDESHEATH SLEND SS 6F .021 (SHEATH) ×1 IMPLANT
GUIDEWIRE INQWIRE 1.5J.035X260 (WIRE) IMPLANT
INQWIRE 1.5J .035X260CM (WIRE) ×2
KIT HEART LEFT (KITS) ×2 IMPLANT
PACK CARDIAC CATHETERIZATION (CUSTOM PROCEDURE TRAY) ×2 IMPLANT
SYR MEDRAD MARK V 150ML (SYRINGE) ×2 IMPLANT
TRANSDUCER W/STOPCOCK (MISCELLANEOUS) ×2 IMPLANT
TUBING CIL FLEX 10 FLL-RA (TUBING) ×2 IMPLANT

## 2018-01-30 NOTE — Interval H&P Note (Signed)
History and Physical Interval Note:  01/30/2018 1:38 PM  Casey Kirk  has presented today for cardiac cath with the diagnosis of angina. The various methods of treatment have been discussed with the patient and family. After consideration of risks, benefits and other options for treatment, the patient has consented to  Procedure(s): LEFT HEART CATH AND CORONARY ANGIOGRAPHY (N/A) as a surgical intervention .  The patient's history has been reviewed, patient examined, no change in status, stable for surgery.  I have reviewed the patient's chart and labs.  Questions were answered to the patient's satisfaction.    Cath Lab Visit (complete for each Cath Lab visit)  Clinical Evaluation Leading to the Procedure:   ACS: No.  Non-ACS:    Anginal Classification: CCS III  Anti-ischemic medical therapy: Minimal Therapy (1 class of medications)  Non-Invasive Test Results: No non-invasive testing performed  Prior CABG: No previous CABG        Lauree Chandler

## 2018-01-30 NOTE — Discharge Instructions (Signed)
° °  DRINK PLENTY OF FLUIDS FOR THE NEXT 2-3 DAYS TO KEEP HYDRATED. ° °Radial Site Care °Refer to this sheet in the next few weeks. These instructions provide you with information about caring for yourself after your procedure. Your health care provider may also give you more specific instructions. Your treatment has been planned according to current medical practices, but problems sometimes occur. Call your health care provider if you have any problems or questions after your procedure. °What can I expect after the procedure? °After your procedure, it is typical to have the following: °· Bruising at the radial site that usually fades within 1-2 weeks. °· Blood collecting in the tissue (hematoma) that may be painful to the touch. It should usually decrease in size and tenderness within 1-2 weeks. ° °Follow these instructions at home: °· Take medicines only as directed by your health care provider. °· You may shower 24-48 hours after the procedure or as directed by your health care provider. Remove the bandage (dressing) and gently wash the site with plain soap and water. Pat the area dry with a clean towel. Do not rub the site, because this may cause bleeding. °· Do not take baths, swim, or use a hot tub until your health care provider approves. °· Check your insertion site every day for redness, swelling, or drainage. °· Do not apply powder or lotion to the site. °· Do not flex or bend the affected arm for 24 hours or as directed by your health care provider. °· Do not push or pull heavy objects with the affected arm for 24 hours or as directed by your health care provider. °· Do not lift over 10 lb (4.5 kg) for 5 days after your procedure or as directed by your health care provider. °· Ask your health care provider when it is okay to: °? Return to work or school. °? Resume usual physical activities or sports. °? Resume sexual activity. °· Do not drive home if you are discharged the same day as the procedure. Have  someone else drive you. °· You may drive 24 hours after the procedure unless otherwise instructed by your health care provider. °· Do not operate machinery or power tools for 24 hours after the procedure. °· If your procedure was done as an outpatient procedure, which means that you went home the same day as your procedure, a responsible adult should be with you for the first 24 hours after you arrive home. °· Keep all follow-up visits as directed by your health care provider. This is important. °Contact a health care provider if: °· You have a fever. °· You have chills. °· You have increased bleeding from the radial site. Hold pressure on the site. °Get help right away if: °· You have unusual pain at the radial site. °· You have redness, warmth, or swelling at the radial site. °· You have drainage (other than a small amount of blood on the dressing) from the radial site. °· The radial site is bleeding, and the bleeding does not stop after 30 minutes of holding steady pressure on the site. °· Your arm or hand becomes pale, cool, tingly, or numb. °This information is not intended to replace advice given to you by your health care provider. Make sure you discuss any questions you have with your health care provider. °Document Released: 06/10/2010 Document Revised: 10/14/2015 Document Reviewed: 11/24/2013 °Elsevier Interactive Patient Education © 2018 [employer] ° °

## 2018-01-31 ENCOUNTER — Encounter (HOSPITAL_COMMUNITY): Payer: Self-pay | Admitting: Cardiovascular Disease

## 2018-02-07 ENCOUNTER — Encounter: Payer: Self-pay | Admitting: Pulmonary Disease

## 2018-02-07 ENCOUNTER — Ambulatory Visit: Payer: PPO | Admitting: Pulmonary Disease

## 2018-02-07 DIAGNOSIS — Z23 Encounter for immunization: Secondary | ICD-10-CM | POA: Diagnosis not present

## 2018-02-07 DIAGNOSIS — J432 Centrilobular emphysema: Secondary | ICD-10-CM | POA: Diagnosis not present

## 2018-02-07 DIAGNOSIS — Z720 Tobacco use: Secondary | ICD-10-CM | POA: Diagnosis not present

## 2018-02-07 NOTE — Assessment & Plan Note (Signed)
SHE IS  trying to quit, continue on Chantix. Trial of Nicotrol Inhaler 1 to 2 puffs every 4 hours as needed #5

## 2018-02-07 NOTE — Assessment & Plan Note (Signed)
Flu shot today. Use albuterol as needed only. The main intervention here is smoking cessation

## 2018-02-07 NOTE — Progress Notes (Signed)
   Subjective:    Patient ID: Casey Kirk, female    DOB: Aug 09, 1945, 72 y.o.   MRN: [id number]  HPI  72 yo smoker for FU of COPD She smoked . 40 pack years She has cad with stent,  bipolar disorder and halting speech  She continues to smoke about half pack per day.  She denies dyspnea or wheezing, seldom uses albuterol.  We discontinued Spiriva within a year ago.  We reviewed screening CT of the chest. She recently had a cardiac cath that showed patent stent  She has been able to cut down smoking some with Chantix but continues to have breakthrough urges   Significant tests/ events reviewed   Spirometry 02/2016- normal ratio 72 and normal lung function FEV1 of 98%  CT chest screen 06/2016 RADS 3- Patchy ground-glass in the lower lobes , mild emphysema, 66mm LLL nodule 12/2017 LDCT >> RADS 2S    Review of Systems Patient denies significant dyspnea,cough, hemoptysis,  chest pain, palpitations, pedal edema, orthopnea, paroxysmal nocturnal dyspnea, lightheadedness, nausea, vomiting, abdominal or  leg pains      Objective:   Physical Exam   Gen. Pleasant, well-nourished, in no distress ENT - no thrush, no post nasal drip, halting speech Neck: No JVD, no thyromegaly, no carotid bruits Lungs: no use of accessory muscles, no dullness to percussion, clear without rales or rhonchi  Cardiovascular: Rhythm regular, heart sounds  normal, no murmurs or gallops, no peripheral edema Musculoskeletal: No deformities, no cyanosis or clubbing         Assessment & Plan:

## 2018-02-07 NOTE — Patient Instructions (Signed)
Flu shot today. Congratulations on trying to quit, continue on Chantix. Trial of Nicotrol Inhaler 1 to 2 puffs every 4 hours as needed #5

## 2018-02-13 NOTE — Progress Notes (Signed)
Cardiology Office Note   Date:  02/14/2018   ID:  YASMINE KILBOURNE, DOB [birth year], MRN [id number]  PCP:  Lawerance Cruel, MD  Cardiologist:  Dr. Marlou Porch    Chief Complaint  Patient presents with  . Hospitalization Follow-up    post cath      History of Present Illness: Casey Kirk is a 72 y.o. female who presents for post cath for chest pain, and patent previous stent.    She has a hx of COPD, bipolar disorder, GERD, IBS, hyperlipidemia, thyroid disease with coronary calcification on CT scan screening with symptoms of fatigue and chest pain. She went forward with heart catheterization on 03/16/17 showed an 80% mid LAD stenosis FFR 0.7. DES x1 PCI performed. She had 10% stenosis of RCA.  She did well. Same day discharge.  She was doing quite well at her last clinic visit on 03/29/17. Still having difficulty with smoking. Bilateral upper leg pain with ambulation. ABIs were normal on 05/09/17.  Carotid Dopplers on 04/17/17: 1. Mild amount of calcified plaque at the level of the left carotid bulb and proximal ICA with estimated left ICA stenosis of less than 50%. 2. Minimal plaque at the level of the right carotid bulb. No evidence of right ICA stenosis.  On last visit  as above with increased chest soreness, it had gone completely away after previous stents.   Now has returned - she does have COPD so she is never sure if cardiac or pulmonary.   But this soreness increases is with activity and she is more fatigued.  She is decreasing activity due to the symptoms.  BP is stable   She has not tried NTG.  Prior to heart cath she had normal nuc.Marland Kitchen   after discussing with  DOD plans were made for cardiac cath.    She does continue to smoke.  She is now on Chantix and is deciding on a stop date she is finding it extremely hard to stop smoking.    Her cath revealed previous placed stent was patent.  She has mild non obstructive CAD of 20% only.  All very reassuring.  EF 65%.   Cath repot stated balloon angioplasty was performed, I will check with Dr. Angelena Form to evaluate as pt was told everything was good.  Pt has not had further discomfort.  She feels well and is decreasing her tobacco on Chantix on   She is walking a mile per day without issues.  She just saw Dr. Elsworth Soho and she knows she needs to stop smoking.    Past Medical History:  Diagnosis Date  . Allergic rhinitis   . Aortic atherosclerosis ([place]) 08/17/2016  . ARTHRITIS, HX OF 01/09/2007  . BIPOLAR II DISORDER 03/17/2008  . 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 COPD 12/21/2006  . COPD (chronic obstructive pulmonary disease) ([place]) 12/21/2006   Emphysema on CT 06/2016 02/2016 FEV1 of 98%    . Coronary artery calcification 08/17/2016  . Coronary artery disease involving native coronary artery of native heart with unstable angina pectoris ([place])   . Dysphasia 05/10/2011  . Family history of early CAD 08/17/2016  . GERD (gastroesophageal reflux disease)   . GOUT 01/09/2007  . Hx of colonic polyps   . Hypothyroidism 10/17/2010  . IBS (irritable bowel syndrome)   . Osteoporosis   . Sinusitis   . Thyroid disease   . TINEA CORPORIS 10/06/2009  . Tobacco abuse 05/10/2011    Past Surgical History:  Procedure  Laterality Date  . ABDOMINAL HYSTERECTOMY     CA cells removed end of vagina  . BREAST SURGERY     biopsy  . CORONARY STENT INTERVENTION N/A 03/16/2017   Procedure: CORONARY STENT INTERVENTION;  Surgeon: Burnell Blanks, MD;  Location: [facility];  Service: Cardiovascular;  Laterality: N/A;  . INTRAVASCULAR PRESSURE WIRE/FFR STUDY N/A 03/16/2017   Procedure: INTRAVASCULAR PRESSURE WIRE/FFR STUDY;  Surgeon: Burnell Blanks, MD;  Location: [facility];  Service: Cardiovascular;  Laterality: N/A;  . LEFT HEART CATH AND CORONARY ANGIOGRAPHY N/A 03/16/2017   Procedure: LEFT HEART CATH AND CORONARY ANGIOGRAPHY;  Surgeon: Burnell Blanks, MD;  Location: [facility];   Service: Cardiovascular;  Laterality: N/A;  . LEFT HEART CATH AND CORONARY ANGIOGRAPHY N/A 01/30/2018   Procedure: LEFT HEART CATH AND CORONARY ANGIOGRAPHY;  Surgeon: Burnell Blanks, MD;  Location: [facility];  Service: Cardiovascular;  Laterality: N/A;  . vaginal carcinoma surgery       Current Outpatient Medications  Medication Sig Dispense Refill  . albuterol (PROVENTIL HFA;VENTOLIN HFA) 108 (90 Base) MCG/ACT inhaler Inhale 2 puffs into the lungs every 6 (six) hours as needed for wheezing or shortness of breath. 1 Inhaler 5  . aspirin 81 MG tablet Take 81 mg by mouth daily.      Marland Kitchen CALCIUM/MAGNESIUM/ZINC FORMULA PO Take 1 tablet by mouth 3 (three) times a week.    . cholecalciferol (VITAMIN D) 1000 units tablet Take 1,000 Units by mouth daily.    . clopidogrel (PLAVIX) 75 MG tablet Take 1 tablet (75 mg total) daily by mouth. 90 tablet 3  . diphenhydrAMINE (BENADRYL) 25 mg capsule Take 25 mg by mouth daily as needed for allergies.    . fish oil-omega-3 fatty acids 1000 MG capsule Take 1 g by mouth 2 (two) times a week.     . fluticasone (FLONASE) 50 MCG/ACT nasal spray Place 2 sprays into both nostrils daily as needed for allergies or rhinitis.    Marland Kitchen lamoTRIgine (LAMICTAL) 25 MG tablet Take 1 tablet by mouth daily as needed.    . Melatonin 3 MG TABS Take 3 mg by mouth at bedtime.    . Multiple Vitamin (MULTIVITAMIN) tablet Take 1 tablet by mouth daily.      . nitroGLYCERIN (NITROSTAT) 0.4 MG SL tablet Place 1 tablet (0.4 mg total) under the tongue every 5 (five) minutes as needed for chest pain. 25 tablet 12  . pantoprazole (PROTONIX) 40 MG tablet Take 1 tablet (40 mg total) by mouth daily. 30 tablet 6  . rosuvastatin (CRESTOR) 20 MG tablet Take 20 mg by mouth daily.     . traZODone (DESYREL) 50 MG tablet Take 50 mg by mouth at bedtime.    . varenicline (CHANTIX STARTING MONTH PAK) 0.5 MG X 11 & 1 MG X 42 tablet TAKE AS DIRECTED 53 tablet 0  . vitamin E 100 UNIT capsule Take  100 Units by mouth daily.      No current facility-administered medications for this visit.     Allergies:   Hydrocodone-acetaminophen; Latex; and Pneumococcal polysaccharide vaccine    Social History:  The patient  reports that she has been smoking cigarettes. She has a 20.00 pack-year smoking history. She has never used smokeless tobacco. She reports that she drinks alcohol. She reports that she does not use drugs.   Family History:  The patient's family history includes Alcohol abuse in her other; Arthritis in her other; COPD  in her mother; Cancer in her other; Colon polyps in her brother and mother; Diabetes in her other; Heart attack (age of onset: 67) in her brother; Heart disease in her other; Hyperlipidemia in her other; Hypertension in her other; Sudden death in her other; Tremor in her mother.    ROS:  General:no colds or fevers, no weight changes Skin:no rashes or ulcers HEENT:no blurred vision, no congestion CV:see HPI PUL:see HPI GI:no diarrhea constipation or melena, no indigestion GU:no hematuria, no dysuria MS:no joint pain, no claudication Neuro:no syncope, no lightheadedness Endo:no diabetes, no thyroid disease  Wt Readings from Last 3 Encounters:  02/14/18 136 lb 12.8 oz (62.1 kg)  02/07/18 137 lb (62.1 kg)  01/30/18 137 lb (62.1 kg)     PHYSICAL EXAM: VS:  BP 114/70   Pulse 73   Ht 5\' 3"  (1.6 m)   Wt 136 lb 12.8 oz (62.1 kg)   SpO2 98%   BMI 24.23 kg/m  , BMI Body mass index is 24.23 kg/m. General:Pleasant affect, NAD Skin:Warm and dry, brisk capillary refill HEENT:normocephalic, sclera clear, mucus membranes moist Neck:supple, no JVD, no bruits  Heart:S1S2 RRR without murmur, gallup, rub or click, rt wrist with no hematoma at cath site.  Lungs:clear without rales, rhonchi, or wheezes URK:YHCW, non tender, + BS, do not palpate liver spleen or masses Ext:no lower ext edema, 2+ pedal pulses, 2+ radial pulses Neuro:alert and oriented X 3, MAE, follows  commands, + facial symmetry    EKG:  EKG is NOT ordered today.    Recent Labs: 03/12/2017: ALT 15 01/24/2018: BUN 10; Creatinine, Ser 0.83; Hemoglobin 14.1; Platelets 216; Potassium 4.4; Sodium 146    Lipid Panel    Component Value Date/Time   CHOL 107 05/10/2017 0832   TRIG 94 05/10/2017 0832   HDL 51 05/10/2017 0832   CHOLHDL 2.1 05/10/2017 0832   CHOLHDL 5 09/22/2010 0843   VLDL 20.6 09/22/2010 0843   LDLCALC 37 05/10/2017 0832       Other studies Reviewed: Additional studies/ records that were reviewed today include: .  Cardiac cath 01/30/18  Previously placed Mid LAD drug eluting stent is widely patent.  Balloon angioplasty was performed.  Mid RCA lesion is 20% stenosed.  The left ventricular systolic function is normal.  LV end diastolic pressure is normal.  The left ventricular ejection fraction is greater than 65% by visual estimate.  There is no mitral valve regurgitation.  Ost Cx to Prox Cx lesion is 20% stenosed.   1. Single vessel CAD with patent stent mid LAD without restenosis 2. Mild non-obstructive disease in the mid RCA and ostial Circumflex.  3. Normal LV systolic function.   Recommendations: Continue medical management of CAD. Tobacco cessation is advised.   ASSESSMENT AND PLAN:  1.  Chest pain, most likely related to pulmonary issues, cath was stable, patent stent.  She stopped the imdur, it did not help pain and not need for it with patent stent.    2.  CAD with prior LAD stent.  This is patent.  She should remain on plavix until the end of October. Will check with Dr. Marlou Porch if ok to stop Plavix at that time.  Her prior stent was placed 03/16/17  --follow up with Dr. Marlou Porch in 6 months.    3.   HLD on statin, will recheck hepatic and lipids in Dec.   4.   COPD per pulmonary  5.   Tobacco use, on Chantix cutting back.  Also some diarrhea  with the chantix.     Current medicines are reviewed with the patient today.  The patient Has  no concerns regarding medicines.  The following changes have been made:  See above Labs/ tests ordered today include:see above  Disposition:   FU:  see above  Signed, Cecilie Kicks, NP  02/14/2018 11:27 AM    [facility], [address] [place], [place] Phone: [phone]; Fax: [phone]

## 2018-02-14 ENCOUNTER — Encounter: Payer: Self-pay | Admitting: Cardiology

## 2018-02-14 ENCOUNTER — Ambulatory Visit: Payer: PPO | Admitting: Cardiology

## 2018-02-14 VITALS — BP 114/70 | HR 73 | Ht 63.0 in | Wt 136.8 lb

## 2018-02-14 DIAGNOSIS — I2510 Atherosclerotic heart disease of native coronary artery without angina pectoris: Secondary | ICD-10-CM | POA: Diagnosis not present

## 2018-02-14 DIAGNOSIS — R0789 Other chest pain: Secondary | ICD-10-CM

## 2018-02-14 DIAGNOSIS — E7800 Pure hypercholesterolemia, unspecified: Secondary | ICD-10-CM | POA: Diagnosis not present

## 2018-02-14 DIAGNOSIS — Z720 Tobacco use: Secondary | ICD-10-CM

## 2018-02-14 NOTE — Patient Instructions (Signed)
Medication Instructions:  1. Your physician recommends that you continue on your current medications as directed. Please refer to the Current Medication list given to you today.   Labwork: FASTING LIPID AND LIVER PANEL TO BE DONE 04/2018  Testing/Procedures: NONE ORDERED TODAY  Follow-Up: DR. Marlou Porch IN 6 MONTHS   Any Other Special Instructions Will Be Listed Below (If Applicable).     If you need a refill on your cardiac medications before your next appointment, please call your pharmacy.

## 2018-02-15 ENCOUNTER — Encounter (HOSPITAL_COMMUNITY): Payer: Self-pay | Admitting: Cardiovascular Disease

## 2018-03-08 DIAGNOSIS — J029 Acute pharyngitis, unspecified: Secondary | ICD-10-CM | POA: Diagnosis not present

## 2018-03-08 DIAGNOSIS — F17200 Nicotine dependence, unspecified, uncomplicated: Secondary | ICD-10-CM | POA: Diagnosis not present

## 2018-03-12 ENCOUNTER — Telehealth: Payer: Self-pay | Admitting: *Deleted

## 2018-03-12 NOTE — Telephone Encounter (Signed)
-----   Message from Isaiah Serge, NP sent at 03/12/2018 12:53 PM EDT ----- Pt may stop plavix on 03/16/18 I discussed with Dr. Marlou Porch.  This is one year after stent ----- Message ----- From: Jerline Pain, MD Sent: 02/15/2018   2:44 PM EDT To: Isaiah Serge, NP  Yes could stop either/or and continue with monotherapy. Candee Furbish, MD  ----- Message ----- From: Isaiah Serge, NP Sent: 02/14/2018  12:16 PM EDT To: Jerline Pain, MD  Pt with patent stent on recent cath for chest pain.  She is bruising a lot, her previous stent was placed 03/16/2017,  Could we stop plavix the end of Oct and continue Plavix?  Just let me know. Thanks.  Mickel Baas

## 2018-03-12 NOTE — Telephone Encounter (Signed)
Follow up  ° ° °Patient is returning your call. °

## 2018-03-12 NOTE — Telephone Encounter (Signed)
Spoke with pt and she has been advised that she can stop the Plavix on 03/16/18. Pt verbalized understanding and thanked me for the call.

## 2018-03-12 NOTE — Telephone Encounter (Signed)
Called pt re: Cecilie Kicks, NP's message below. Left a message for pt to call back.

## 2018-03-27 DIAGNOSIS — Q385 Congenital malformations of palate, not elsewhere classified: Secondary | ICD-10-CM | POA: Diagnosis not present

## 2018-03-27 DIAGNOSIS — R220 Localized swelling, mass and lump, head: Secondary | ICD-10-CM | POA: Diagnosis not present

## 2018-03-29 ENCOUNTER — Other Ambulatory Visit: Payer: Self-pay | Admitting: Otolaryngology

## 2018-03-29 DIAGNOSIS — R220 Localized swelling, mass and lump, head: Secondary | ICD-10-CM

## 2018-04-02 ENCOUNTER — Other Ambulatory Visit: Payer: Self-pay | Admitting: Cardiology

## 2018-04-02 NOTE — Telephone Encounter (Signed)
Please advise refill on Plavix since Cecilie Kicks last note state to f/u on continuation of Plavix with you

## 2018-04-03 ENCOUNTER — Ambulatory Visit
Admission: RE | Admit: 2018-04-03 | Discharge: 2018-04-03 | Disposition: A | Discharge status: Home / Self Care | Payer: PPO | Source: Ambulatory Visit | Attending: Otolaryngology | Admitting: Otolaryngology

## 2018-04-03 DIAGNOSIS — R220 Localized swelling, mass and lump, head: Secondary | ICD-10-CM

## 2018-04-03 DIAGNOSIS — R221 Localized swelling, mass and lump, neck: Secondary | ICD-10-CM | POA: Diagnosis not present

## 2018-04-03 MED ORDER — IOPAMIDOL (ISOVUE-300) INJECTION 61%
75.0000 mL | Freq: Once | INTRAVENOUS | Status: AC | PRN
  Administered 2018-04-03: 75 mL via INTRAVENOUS

## 2018-04-04 NOTE — Telephone Encounter (Signed)
-----   Message from Isaiah Serge, NP sent at 03/12/2018 12:53 PM EDT ----- Pt may stop plavix on 03/16/18 I discussed with Dr. Marlou Porch.  This is one year after stent ----- Message ----- From: Jerline Pain, MD

## 2018-04-10 DIAGNOSIS — Q385 Congenital malformations of palate, not elsewhere classified: Secondary | ICD-10-CM | POA: Diagnosis not present

## 2018-04-10 DIAGNOSIS — R220 Localized swelling, mass and lump, head: Secondary | ICD-10-CM | POA: Diagnosis not present

## 2018-04-11 ENCOUNTER — Other Ambulatory Visit: Payer: Self-pay | Admitting: Cardiology

## 2018-04-11 MED ORDER — CLOPIDOGREL BISULFATE 75 MG PO TABS: 75.0000 mg | ORAL_TABLET | Freq: Every day | ORAL | 2 refills | Status: DC

## 2018-04-22 DIAGNOSIS — Z79899 Other long term (current) drug therapy: Secondary | ICD-10-CM | POA: Diagnosis not present

## 2018-04-22 DIAGNOSIS — M810 Age-related osteoporosis without current pathological fracture: Secondary | ICD-10-CM | POA: Diagnosis not present

## 2018-04-22 DIAGNOSIS — E7800 Pure hypercholesterolemia, unspecified: Secondary | ICD-10-CM | POA: Diagnosis not present

## 2018-04-22 DIAGNOSIS — E559 Vitamin D deficiency, unspecified: Secondary | ICD-10-CM | POA: Diagnosis not present

## 2018-04-23 DIAGNOSIS — L821 Other seborrheic keratosis: Secondary | ICD-10-CM | POA: Diagnosis not present

## 2018-04-23 DIAGNOSIS — Z0000 Encounter for general adult medical examination without abnormal findings: Secondary | ICD-10-CM | POA: Diagnosis not present

## 2018-04-23 DIAGNOSIS — I700 Atherosclerosis of aorta: Secondary | ICD-10-CM | POA: Diagnosis not present

## 2018-04-23 DIAGNOSIS — M810 Age-related osteoporosis without current pathological fracture: Secondary | ICD-10-CM | POA: Diagnosis not present

## 2018-04-23 DIAGNOSIS — I779 Disorder of arteries and arterioles, unspecified: Secondary | ICD-10-CM | POA: Diagnosis not present

## 2018-04-23 DIAGNOSIS — K635 Polyp of colon: Secondary | ICD-10-CM | POA: Diagnosis not present

## 2018-04-23 DIAGNOSIS — E7800 Pure hypercholesterolemia, unspecified: Secondary | ICD-10-CM | POA: Diagnosis not present

## 2018-04-23 DIAGNOSIS — E559 Vitamin D deficiency, unspecified: Secondary | ICD-10-CM | POA: Diagnosis not present

## 2018-04-23 DIAGNOSIS — I2510 Atherosclerotic heart disease of native coronary artery without angina pectoris: Secondary | ICD-10-CM | POA: Diagnosis not present

## 2018-04-23 DIAGNOSIS — J449 Chronic obstructive pulmonary disease, unspecified: Secondary | ICD-10-CM | POA: Diagnosis not present

## 2018-04-24 ENCOUNTER — Other Ambulatory Visit: Payer: Self-pay | Admitting: Otolaryngology

## 2018-04-29 NOTE — Pre-Procedure Instructions (Signed)
[facility]  04/29/2018      [id number] - [place] [address] Phone: [phone] Fax: [phone]    Your procedure is scheduled on Friday December 20th.  Report to [facility] Admitting at 0830 A.M.  Call this number if you have problems the morning of surgery:  [phone]   Remember:  Do not eat or drink after midnight.    Take these medicines the morning of surgery with A SIP OF WATER  acetaminophen (TYLENOL)  If needed albuterol (PROVENTIL HFA;VENTOLIN HFA) if needed, Bring inhaler with you fluticasone (FLONASE)  If needed lamoTRIgine (LAMICTAL) if needed rosuvastatin (CRESTOR)  Follow your surgeon's instructions for when to stop/resume your aspirin. If no instructions were given to you, please call your surgeon's office.  7 days prior to surgery STOP taking any Aspirin(unless otherwise instructed by your surgeon), Aleve, Naproxen, Ibuprofen, Motrin, Advil, Goody's, BC's, all herbal medications, fish oil, and all vitamins    Do not wear jewelry, make-up or nail polish.  Do not wear lotions, powders, or perfumes, or deodorant.  Do not shave 48 hours prior to surgery.  Men may shave face and neck.  Do not bring valuables to the hospital.  [facility] is not responsible for any belongings or valuables.  Contacts, dentures or bridgework may not be worn into surgery.  Leave your suitcase in the car.  After surgery it may be brought to your room.  For patients admitted to the hospital, discharge time will be determined by your treatment team.  Patients discharged the day of surgery will not be allowed to drive home.    [facility]- Preparing For Surgery  Before surgery, you can play an important role. Because skin is not sterile, your skin needs to be as free of germs as possible. You can reduce the number of germs on your skin by washing with CHG (chlorahexidine gluconate) Soap before  surgery.  CHG is an antiseptic cleaner which kills germs and bonds with the skin to continue killing germs even after washing.    Oral Hygiene is also important to reduce your risk of infection.  Remember - BRUSH YOUR TEETH THE MORNING OF SURGERY WITH YOUR REGULAR TOOTHPASTE  Please do not use if you have an allergy to CHG or antibacterial soaps. If your skin becomes reddened/irritated stop using the CHG.  Do not shave (including legs and underarms) for at least 48 hours prior to first CHG shower. It is OK to shave your face.  Please follow these instructions carefully.   1. Shower the NIGHT BEFORE SURGERY and the MORNING OF SURGERY with CHG.   2. If you chose to wash your hair, wash your hair first as usual with your normal shampoo.  3. After you shampoo, rinse your hair and body thoroughly to remove the shampoo.  4. Use CHG as you would any other liquid soap. You can apply CHG directly to the skin and wash gently with a scrungie or a clean washcloth.   5. Apply the CHG Soap to your body ONLY FROM THE NECK DOWN.  Do not use on open wounds or open sores. Avoid contact with your eyes, ears, mouth and genitals (private parts). Wash Face and genitals (private parts)  with your normal soap.  6. Wash thoroughly, paying special attention to the area where your surgery will be performed.  7. Thoroughly rinse your body with warm water from  the neck down.  8. DO NOT shower/wash with your normal soap after using and rinsing off the CHG Soap.  9. Pat yourself dry with a CLEAN TOWEL.  10. Wear CLEAN PAJAMAS to bed the night before surgery, wear comfortable clothes the morning of surgery  11. Place CLEAN SHEETS on your bed the night of your first shower and DO NOT SLEEP WITH PETS.    Day of Surgery:  Do not apply any deodorants/lotions.  Please wear clean clothes to the hospital/surgery center.   Remember to brush your teeth WITH YOUR REGULAR TOOTHPASTE.    Please read over the following  fact sheets that you were given.

## 2018-04-30 ENCOUNTER — Encounter (HOSPITAL_COMMUNITY): Payer: Self-pay

## 2018-04-30 ENCOUNTER — Other Ambulatory Visit: Payer: Self-pay

## 2018-04-30 ENCOUNTER — Encounter (HOSPITAL_COMMUNITY)
Admission: RE | Admit: 2018-04-30 | Discharge: 2018-04-30 | Disposition: A | Discharge status: Home / Self Care | Payer: PPO | Source: Ambulatory Visit | Attending: Otolaryngology | Admitting: Otolaryngology

## 2018-04-30 DIAGNOSIS — Z01812 Encounter for preprocedural laboratory examination: Secondary | ICD-10-CM | POA: Diagnosis not present

## 2018-04-30 HISTORY — DX: Unspecified osteoarthritis, unspecified site: M19.90

## 2018-04-30 LAB — BASIC METABOLIC PANEL
Anion gap: 10 (ref 5–15)
BUN: 7 mg/dL — ABNORMAL LOW (ref 8–23)
CO2: 27 mmol/L (ref 22–32)
Calcium: 9.5 mg/dL (ref 8.9–10.3)
Chloride: 108 mmol/L (ref 98–111)
Creatinine, Ser: 0.86 mg/dL (ref 0.44–1.00)
GFR calc Af Amer: >60 mL/min (ref >60)
GFR calc non Af Amer: >60 mL/min (ref >60)
Glucose, Bld: 89 mg/dL (ref 70–99)
Potassium: 4.3 mmol/L (ref 3.5–5.1)
Sodium: 145 mmol/L (ref 135–145)

## 2018-04-30 LAB — HEMOGLOBIN: Hemoglobin: 15 g/dL (ref 12.0–15.0)

## 2018-04-30 NOTE — Progress Notes (Signed)
PCP - Lona Kettle MD Cardiologist - Dr. Marlou Porch  Chest x-ray - N/A EKG - 01/24/18 Stress Test - 2018 Cardiac Cath - 2019   Blood Thinner Instructions:N/A Aspirin Instructions: will call Dr. Wilburn Cornelia for instructions  Anesthesia review: yes, hx CAD  Patient denies shortness of breath, fever, cough and chest pain at PAT appointment   Patient verbalized understanding of instructions that were given to them at the PAT appointment. Patient was also instructed that they will need to review over the PAT instructions again at home before surgery.

## 2018-05-02 ENCOUNTER — Other Ambulatory Visit (HOSPITAL_COMMUNITY): Payer: PPO

## 2018-05-02 NOTE — Progress Notes (Signed)
Anesthesia Chart Review:  Case:  [id number] Date/Time:  05/10/18 1015   Procedure:  EXCISION MASS OF RIGHT PALATAL  WITH LOCAL SOFT TISSUE RECONSTRUCTION (Right )   Anesthesia type:  General   Pre-op diagnosis:  Right palatal mass   Location:  [facility] OR ROOM 09 / [facility]   Surgeon:  Jerrell Belfast, MD      DISCUSSION: 72 yo female current smoker. Pertinent hx includes CAD (Cath prompted by disease noted on coronary CT; subsequently had DES to LAD 02/2017), COPD, GERD, Bipolar.  Pt follows with cardiology for CAD s/p DES to LAD 02/2017. Last seen by Cecilie Kicks, NP 02/14/2018. Per her note the pt recently underwent cath 01/30/18 to eval recent chest discomfort. Cath showed patent stent to LAD with otherwise mild nonobstructive CAD. Normal LV systolic function.  Chest pain felt most likely pulmonary etiology based on reassuring cath. Plavix was stopped in Oct due to completion of 12 months DAPT s/p DES. Continues on ASA 81mg . She was recommended 6 month followup.   Follows with pulmonology, Dr. Elsworth Soho, for COPD. Per his note 02/07/2018 pt still working on quitting smoking, now 1/2 PPD. Spirometry 02/2016- normal ratio 72 and normal lung function FEV1 of 98%. Recommended continue albuterol PRN.  Given recent cath with patent LAD stent and other mild nonobstructive CAD, anticipate she can proceed as planned barring acute status change.   VS: BP (!) 144/61   Pulse 71   Temp 37.1 C   Resp 18   Ht 5\' 2"  (1.575 m)   Wt 62.7 kg   SpO2 98%   BMI 25.28 kg/m   PROVIDERS: Lawerance Cruel, MD is PCP  Kara Mead, MD is Pulmonologist  Candee Furbish, MD is Cardiologist  LABS: Labs reviewed: Acceptable for surgery. (all labs ordered are listed, but only abnormal results are displayed)  Labs Reviewed  BASIC METABOLIC PANEL - Abnormal; Notable for the following components:      Result Value   BUN 7 (*)    All other components within normal limits  HEMOGLOBIN     IMAGES: CT Chest lung CA  screening 01/14/2018: IMPRESSION: 1. Lung-RADS 2S, benign appearance or behavior. Continue annual screening with low-dose chest CT without contrast in 12 months. 2. The "S" modifier above refers to potentially clinically significant non lung cancer related findings. Specifically, there is aortic atherosclerosis, in addition to 2 vessel coronary artery disease. Please note that although the presence of coronary artery calcium documents the presence of coronary artery disease, the severity of this disease and any potential stenosis cannot be assessed on this non-gated CT examination. Assessment for potential risk factor modification, dietary therapy or pharmacologic therapy may be warranted, if clinically indicated. 3. Mild diffuse bronchial wall thickening with mild centrilobular and paraseptal emphysema; imaging findings suggestive of underlying COPD.  EKG: 01/24/2018:  NSR. Rate 69.  CV: Cath 01/30/2018:  Previously placed Mid LAD drug eluting stent is widely patent.  Mid RCA lesion is 20% stenosed.  The left ventricular systolic function is normal.  LV end diastolic pressure is normal.  The left ventricular ejection fraction is greater than 65% by visual estimate.  There is no mitral valve regurgitation.  Ost Cx to Prox Cx lesion is 20% stenosed.   1. Single vessel CAD with patent stent mid LAD without restenosis 2. Mild non-obstructive disease in the mid RCA and ostial Circumflex.  3. Normal LV systolic function.   Recommendations: Continue medical management of CAD. Tobacco cessation is advised.  Past Medical History:  Diagnosis Date  . Allergic rhinitis   . Aortic atherosclerosis ([place]) 08/17/2016  . Arthritis   . ARTHRITIS, HX OF 01/09/2007  . BIPOLAR II DISORDER 03/17/2008  . 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 COPD 12/21/2006  . COPD (chronic obstructive pulmonary disease) ([place]) 12/21/2006   Emphysema on CT 06/2016 02/2016 FEV1 of 98%    . Coronary artery  calcification 08/17/2016  . Coronary artery disease involving native coronary artery of native heart with unstable angina pectoris ([place])   . Dysphasia 05/10/2011  . Family history of early CAD 08/17/2016  . GERD (gastroesophageal reflux disease)   . GOUT 01/09/2007  . Hx of colonic polyps   . Hypothyroidism 10/17/2010  . IBS (irritable bowel syndrome)   . Osteoporosis   . Sinusitis   . Thyroid disease   . TINEA CORPORIS 10/06/2009  . Tobacco abuse 05/10/2011    Past Surgical History:  Procedure Laterality Date  . ABDOMINAL HYSTERECTOMY     CA cells removed end of vagina  . BREAST SURGERY     biopsy  . COLONOSCOPY    . CORONARY STENT INTERVENTION N/A 03/16/2017   Procedure: CORONARY STENT INTERVENTION;  Surgeon: Burnell Blanks, MD;  Location: [facility];  Service: Cardiovascular;  Laterality: N/A;  . EYE SURGERY Right    cataract  . INTRAVASCULAR PRESSURE WIRE/FFR STUDY N/A 03/16/2017   Procedure: INTRAVASCULAR PRESSURE WIRE/FFR STUDY;  Surgeon: Burnell Blanks, MD;  Location: [facility];  Service: Cardiovascular;  Laterality: N/A;  . LEFT HEART CATH AND CORONARY ANGIOGRAPHY N/A 03/16/2017   Procedure: LEFT HEART CATH AND CORONARY ANGIOGRAPHY;  Surgeon: Burnell Blanks, MD;  Location: [facility];  Service: Cardiovascular;  Laterality: N/A;  . LEFT HEART CATH AND CORONARY ANGIOGRAPHY N/A 01/30/2018   Procedure: LEFT HEART CATH AND CORONARY ANGIOGRAPHY;  Surgeon: Burnell Blanks, MD;  Location: [facility];  Service: Cardiovascular;  Laterality: N/A;  . TOOTH EXTRACTION    . vaginal carcinoma surgery      MEDICATIONS: . acetaminophen (TYLENOL) 500 MG tablet  . albuterol (PROVENTIL HFA;VENTOLIN HFA) 108 (90 Base) MCG/ACT inhaler  . aspirin 81 MG tablet  . CALCIUM/MAGNESIUM/ZINC FORMULA PO  . cholecalciferol (VITAMIN D) 1000 units tablet  . diphenhydrAMINE (BENADRYL) 25 mg capsule  . fish oil-omega-3 fatty acids 1000 MG  capsule  . fluticasone (FLONASE) 50 MCG/ACT nasal spray  . lamoTRIgine (LAMICTAL) 25 MG tablet  . Melatonin 3 MG TABS  . Multiple Vitamin (MULTIVITAMIN) tablet  . rosuvastatin (CRESTOR) 20 MG tablet  . traZODone (DESYREL) 50 MG tablet  . Turmeric Curcumin 500 MG CAPS  . vitamin C (ASCORBIC ACID) 500 MG tablet  . vitamin E 100 UNIT capsule   No current facility-administered medications for this encounter.     Wynonia Musty [facility] Short Stay Center/Anesthesiology Phone [phone] 05/02/2018 5:00 PM

## 2018-05-02 NOTE — Anesthesia Preprocedure Evaluation (Addendum)
Anesthesia Evaluation  Patient identified by MRN, date of birth, ID band Patient awake    Reviewed: Allergy & Precautions, NPO status , Patient's Chart, lab work & pertinent test results  History of Anesthesia Complications Negative for: history of anesthetic complications  Airway Mallampati: II  TM Distance: >3 FB Neck ROM: Full    Dental  (+) Dental Advisory Given, Upper Dentures   Pulmonary COPD,  COPD inhaler, Current Smoker,    breath sounds clear to auscultation       Cardiovascular + angina + CAD and + Cardiac Stents   Rhythm:Regular Rate:Normal   '19 Cath - Previously placed Mid LAD drug eluting stent is widely patent. Mid RCA lesion is 20% stenosed. The left ventricular systolic function is normal. LV end diastolic pressure is normal. The LVEF is greater than 65% by visual estimate. There is no mitral valve regurgitation. Ost Cx to Prox Cx lesion is 20% stenosed.  '19 Carotid US - 1. Mild amount of calcified plaque at the level of the left carotid bulb and proximal ICA with estimated left ICA stenosis of less than 50%. 2. Minimal plaque at the level of the right carotid bulb. No evidence of right ICA stenosis.   Neuro/Psych PSYCHIATRIC DISORDERS Bipolar Disorder negative neurological ROS     GI/Hepatic Neg liver ROS, GERD  Controlled, IBS    Endo/Other  Hypothyroidism   Renal/GU negative Renal ROS     Musculoskeletal  (+) Arthritis ,  Gout    Abdominal   Peds  Hematology negative hematology ROS (+)   Anesthesia Other Findings   Reproductive/Obstetrics                           Anesthesia Physical Anesthesia Plan  ASA: III  Anesthesia Plan: General   Post-op Pain Management:    Induction: Intravenous  PONV Risk Score and Plan: 4 or greater and Treatment may vary due to age or medical condition, Ondansetron and Dexamethasone  Airway Management Planned: Oral  ETT  Additional Equipment: None  Intra-op Plan:   Post-operative Plan: Extubation in OR  Informed Consent: I have reviewed the patients History and Physical, chart, labs and discussed the procedure including the risks, benefits and alternatives for the proposed anesthesia with the patient or authorized representative who has indicated his/her understanding and acceptance.   Dental advisory given  Plan Discussed with: CRNA and Anesthesiologist  Anesthesia Plan Comments:       Anesthesia Quick Evaluation

## 2018-05-10 ENCOUNTER — Encounter (HOSPITAL_COMMUNITY): Payer: Self-pay

## 2018-05-10 ENCOUNTER — Ambulatory Visit (HOSPITAL_COMMUNITY)
Admission: RE | Admit: 2018-05-10 | Discharge: 2018-05-10 | Disposition: A | Discharge status: Home / Self Care | Payer: PPO | Attending: Otolaryngology | Admitting: Otolaryngology

## 2018-05-10 ENCOUNTER — Ambulatory Visit (HOSPITAL_COMMUNITY): Payer: PPO | Admitting: Anesthesiology

## 2018-05-10 ENCOUNTER — Encounter (HOSPITAL_COMMUNITY)
Admission: RE | Disposition: A | Discharge status: Home / Self Care | Payer: Self-pay | Source: Home / Self Care | Attending: Otolaryngology

## 2018-05-10 ENCOUNTER — Ambulatory Visit (HOSPITAL_COMMUNITY): Payer: PPO | Admitting: Vascular Surgery

## 2018-05-10 ENCOUNTER — Other Ambulatory Visit: Payer: PPO

## 2018-05-10 ENCOUNTER — Other Ambulatory Visit: Payer: Self-pay

## 2018-05-10 DIAGNOSIS — J449 Chronic obstructive pulmonary disease, unspecified: Secondary | ICD-10-CM | POA: Diagnosis not present

## 2018-05-10 DIAGNOSIS — M1990 Unspecified osteoarthritis, unspecified site: Secondary | ICD-10-CM | POA: Diagnosis not present

## 2018-05-10 DIAGNOSIS — E079 Disorder of thyroid, unspecified: Secondary | ICD-10-CM | POA: Insufficient documentation

## 2018-05-10 DIAGNOSIS — K589 Irritable bowel syndrome without diarrhea: Secondary | ICD-10-CM | POA: Diagnosis not present

## 2018-05-10 DIAGNOSIS — F17210 Nicotine dependence, cigarettes, uncomplicated: Secondary | ICD-10-CM | POA: Diagnosis not present

## 2018-05-10 DIAGNOSIS — Z7982 Long term (current) use of aspirin: Secondary | ICD-10-CM | POA: Diagnosis not present

## 2018-05-10 DIAGNOSIS — Z8544 Personal history of malignant neoplasm of other female genital organs: Secondary | ICD-10-CM | POA: Insufficient documentation

## 2018-05-10 DIAGNOSIS — Z885 Allergy status to narcotic agent status: Secondary | ICD-10-CM | POA: Diagnosis not present

## 2018-05-10 DIAGNOSIS — C059 Malignant neoplasm of palate, unspecified: Secondary | ICD-10-CM | POA: Diagnosis not present

## 2018-05-10 DIAGNOSIS — Z955 Presence of coronary angioplasty implant and graft: Secondary | ICD-10-CM | POA: Insufficient documentation

## 2018-05-10 DIAGNOSIS — K1379 Other lesions of oral mucosa: Secondary | ICD-10-CM | POA: Diagnosis not present

## 2018-05-10 DIAGNOSIS — K219 Gastro-esophageal reflux disease without esophagitis: Secondary | ICD-10-CM | POA: Insufficient documentation

## 2018-05-10 DIAGNOSIS — Z79899 Other long term (current) drug therapy: Secondary | ICD-10-CM | POA: Insufficient documentation

## 2018-05-10 DIAGNOSIS — C051 Malignant neoplasm of soft palate: Secondary | ICD-10-CM | POA: Diagnosis not present

## 2018-05-10 DIAGNOSIS — I2510 Atherosclerotic heart disease of native coronary artery without angina pectoris: Secondary | ICD-10-CM | POA: Diagnosis not present

## 2018-05-10 DIAGNOSIS — R220 Localized swelling, mass and lump, head: Secondary | ICD-10-CM | POA: Diagnosis not present

## 2018-05-10 DIAGNOSIS — E039 Hypothyroidism, unspecified: Secondary | ICD-10-CM | POA: Diagnosis not present

## 2018-05-10 HISTORY — PX: MASS EXCISION: SHX2000

## 2018-05-10 SURGERY — EXCISION MASS
Anesthesia: General | Laterality: Right

## 2018-05-10 MED ORDER — FENTANYL CITRATE (PF) 100 MCG/2ML IJ SOLN
INTRAMUSCULAR | Status: DC | PRN
  Administered 2018-05-10: 25 ug via INTRAVENOUS
  Administered 2018-05-10 (×2): 50 ug via INTRAVENOUS
  Administered 2018-05-10: 25 ug via INTRAVENOUS
  Administered 2018-05-10 (×2): 50 ug via INTRAVENOUS

## 2018-05-10 MED ORDER — LIDOCAINE 2% (20 MG/ML) 5 ML SYRINGE
INTRAMUSCULAR | Status: AC
  Filled 2018-05-10: qty 5

## 2018-05-10 MED ORDER — PROPOFOL 10 MG/ML IV BOLUS
INTRAVENOUS | Status: AC
  Filled 2018-05-10: qty 20

## 2018-05-10 MED ORDER — LIDOCAINE-EPINEPHRINE 1 %-1:100000 IJ SOLN
INTRAMUSCULAR | Status: DC | PRN
  Administered 2018-05-10: 1 mL

## 2018-05-10 MED ORDER — FENTANYL CITRATE (PF) 100 MCG/2ML IJ SOLN
INTRAMUSCULAR | Status: AC
  Filled 2018-05-10: qty 2

## 2018-05-10 MED ORDER — OXYCODONE-ACETAMINOPHEN 5-325 MG PO TABS
1.0000 | ORAL_TABLET | Freq: Once | ORAL | Status: AC
  Administered 2018-05-10: 1 via ORAL

## 2018-05-10 MED ORDER — LACTATED RINGERS IV SOLN
INTRAVENOUS | Status: DC
  Administered 2018-05-10 (×2): via INTRAVENOUS

## 2018-05-10 MED ORDER — FENTANYL CITRATE (PF) 100 MCG/2ML IJ SOLN
25.0000 ug | INTRAMUSCULAR | Status: DC | PRN
  Administered 2018-05-10: 50 ug via INTRAVENOUS

## 2018-05-10 MED ORDER — LIDOCAINE 2% (20 MG/ML) 5 ML SYRINGE
INTRAMUSCULAR | Status: DC | PRN
  Administered 2018-05-10: 60 mg via INTRAVENOUS

## 2018-05-10 MED ORDER — CHLORHEXIDINE GLUCONATE CLOTH 2 % EX PADS: 6.0000 | MEDICATED_PAD | Freq: Once | CUTANEOUS | Status: DC

## 2018-05-10 MED ORDER — DEXAMETHASONE SODIUM PHOSPHATE 10 MG/ML IJ SOLN
10.0000 mg | Freq: Once | INTRAMUSCULAR | Status: AC
  Administered 2018-05-10: 10 mg via INTRAVENOUS

## 2018-05-10 MED ORDER — SUGAMMADEX SODIUM 200 MG/2ML IV SOLN
INTRAVENOUS | Status: DC | PRN
  Administered 2018-05-10: 100 mg via INTRAVENOUS

## 2018-05-10 MED ORDER — ESMOLOL HCL 100 MG/10ML IV SOLN
INTRAVENOUS | Status: DC | PRN
  Administered 2018-05-10 (×2): 10 mg via INTRAVENOUS

## 2018-05-10 MED ORDER — ONDANSETRON HCL 4 MG/2ML IJ SOLN
INTRAMUSCULAR | Status: AC
  Filled 2018-05-10: qty 2

## 2018-05-10 MED ORDER — ESMOLOL HCL 100 MG/10ML IV SOLN
INTRAVENOUS | Status: AC
  Filled 2018-05-10: qty 10

## 2018-05-10 MED ORDER — ONDANSETRON HCL 4 MG/2ML IJ SOLN: 4.0000 mg | Freq: Once | INTRAMUSCULAR | Status: DC | PRN

## 2018-05-10 MED ORDER — FENTANYL CITRATE (PF) 250 MCG/5ML IJ SOLN
INTRAMUSCULAR | Status: AC
  Filled 2018-05-10: qty 5

## 2018-05-10 MED ORDER — MIDAZOLAM HCL 5 MG/5ML IJ SOLN
INTRAMUSCULAR | Status: DC | PRN
  Administered 2018-05-10 (×2): 1 mg via INTRAVENOUS

## 2018-05-10 MED ORDER — ROCURONIUM BROMIDE 50 MG/5ML IV SOSY
PREFILLED_SYRINGE | INTRAVENOUS | Status: AC
  Filled 2018-05-10: qty 5

## 2018-05-10 MED ORDER — MIDAZOLAM HCL 2 MG/2ML IJ SOLN
INTRAMUSCULAR | Status: AC
  Filled 2018-05-10: qty 2

## 2018-05-10 MED ORDER — CEPHALEXIN 500 MG PO CAPS: 500.0000 mg | ORAL_CAPSULE | Freq: Three times a day (TID) | ORAL | 1 refills | Status: DC

## 2018-05-10 MED ORDER — ONDANSETRON HCL 4 MG/2ML IJ SOLN
INTRAMUSCULAR | Status: DC | PRN
  Administered 2018-05-10: 4 mg via INTRAVENOUS

## 2018-05-10 MED ORDER — OXYCODONE-ACETAMINOPHEN 5-325 MG PO TABS
ORAL_TABLET | ORAL | Status: AC
  Filled 2018-05-10: qty 1

## 2018-05-10 MED ORDER — OXYCODONE-ACETAMINOPHEN 2.5-325 MG PO TABS: 1.0000 | ORAL_TABLET | Freq: Four times a day (QID) | ORAL | 0 refills | Status: AC | PRN

## 2018-05-10 MED ORDER — DEXAMETHASONE SODIUM PHOSPHATE 10 MG/ML IJ SOLN
INTRAMUSCULAR | Status: AC
  Filled 2018-05-10: qty 1

## 2018-05-10 MED ORDER — PROPOFOL 10 MG/ML IV BOLUS
INTRAVENOUS | Status: DC | PRN
  Administered 2018-05-10: 120 mg via INTRAVENOUS

## 2018-05-10 MED ORDER — CEFAZOLIN SODIUM-DEXTROSE 2-4 GM/100ML-% IV SOLN
INTRAVENOUS | Status: AC
  Filled 2018-05-10: qty 100

## 2018-05-10 MED ORDER — ROCURONIUM BROMIDE 100 MG/10ML IV SOLN
INTRAVENOUS | Status: DC | PRN
  Administered 2018-05-10: 40 mg via INTRAVENOUS

## 2018-05-10 MED ORDER — 0.9 % SODIUM CHLORIDE (POUR BTL) OPTIME
TOPICAL | Status: DC | PRN
  Administered 2018-05-10: 1000 mL

## 2018-05-10 MED ORDER — EPHEDRINE SULFATE-NACL 50-0.9 MG/10ML-% IV SOSY
PREFILLED_SYRINGE | INTRAVENOUS | Status: DC | PRN
  Administered 2018-05-10: 10 mg via INTRAVENOUS

## 2018-05-10 MED ORDER — CEFAZOLIN SODIUM-DEXTROSE 2-4 GM/100ML-% IV SOLN
2.0000 g | INTRAVENOUS | Status: AC
  Administered 2018-05-10: 2 g via INTRAVENOUS

## 2018-05-10 SURGICAL SUPPLY — 30 items
ADH SKN CLS APL DERMABOND .7 (GAUZE/BANDAGES/DRESSINGS)
BLADE SURG 15 STRL LF DISP TIS (BLADE) ×1 IMPLANT
BLADE SURG 15 STRL SS (BLADE) ×3
CLEANER TIP ELECTROSURG 2X2 (MISCELLANEOUS) ×2 IMPLANT
COAGULATOR SUCT 8FR VV (MISCELLANEOUS) ×2 IMPLANT
COVER SURGICAL LIGHT HANDLE (MISCELLANEOUS) ×3 IMPLANT
COVER WAND RF STERILE (DRAPES) ×1 IMPLANT
DERMABOND ADVANCED (GAUZE/BANDAGES/DRESSINGS)
DERMABOND ADVANCED .7 DNX12 (GAUZE/BANDAGES/DRESSINGS) ×1 IMPLANT
DRAPE HALF SHEET 40X57 (DRAPES) IMPLANT
ELECT COATED BLADE 2.86 ST (ELECTRODE) ×3 IMPLANT
ELECT REM PT RETURN 9FT ADLT (ELECTROSURGICAL)
ELECTRODE REM PT RTRN 9FT ADLT (ELECTROSURGICAL) IMPLANT
GAUZE 4X4 16PLY RFD (DISPOSABLE) ×3 IMPLANT
GLOVE BIOGEL M 7.0 STRL (GLOVE) ×1 IMPLANT
GOWN STRL REUS W/ TWL LRG LVL3 (GOWN DISPOSABLE) ×2 IMPLANT
GOWN STRL REUS W/TWL LRG LVL3 (GOWN DISPOSABLE) ×6
KIT BASIN OR (CUSTOM PROCEDURE TRAY) ×3 IMPLANT
KIT TURNOVER KIT B (KITS) ×3 IMPLANT
NDL HYPO 25GX1X1/2 BEV (NEEDLE) ×1 IMPLANT
NEEDLE HYPO 25GX1X1/2 BEV (NEEDLE) ×3 IMPLANT
NS IRRIG 1000ML POUR BTL (IV SOLUTION) ×3 IMPLANT
PAD ARMBOARD 7.5X6 YLW CONV (MISCELLANEOUS) ×4 IMPLANT
PENCIL BUTTON HOLSTER BLD 10FT (ELECTRODE) ×2 IMPLANT
SUT CHROMIC 4 0 RB 1X27 (SUTURE) ×8 IMPLANT
SUT SILK 3 0 SH 30 (SUTURE) ×2 IMPLANT
SUT VICRYL 4-0 PS2 18IN ABS (SUTURE) IMPLANT
SYR CONTROL 10ML LL (SYRINGE) ×3 IMPLANT
TOWEL OR 17X24 6PK STRL BLUE (TOWEL DISPOSABLE) ×3 IMPLANT
TRAY ENT MC OR (CUSTOM PROCEDURE TRAY) ×3 IMPLANT

## 2018-05-10 NOTE — Anesthesia Postprocedure Evaluation (Signed)
Anesthesia Post Note  Patient: Casey Kirk  Procedure(s) Performed: EXCISION MASS OF RIGHT PALATAL  WITH LOCAL SOFT TISSUE RECONSTRUCTION (Right )     Patient location during evaluation: PACU Anesthesia Type: General Level of consciousness: awake and alert Pain management: pain level controlled Vital Signs Assessment: post-procedure vital signs reviewed and stable Respiratory status: spontaneous breathing, nonlabored ventilation and respiratory function stable Cardiovascular status: blood pressure returned to baseline and stable Postop Assessment: no apparent nausea or vomiting Anesthetic complications: no    Last Vitals:  Vitals:   05/10/18 1240 05/10/18 1255  BP: (!) 158/77 128/83  Pulse: 66 68  Resp: 13 17  Temp:    SpO2: 95% 96%    Last Pain:  Vitals:   05/10/18 1255  PainSc: [place]

## 2018-05-10 NOTE — Transfer of Care (Signed)
Immediate Anesthesia Transfer of Care Note  Patient: Casey Kirk  Procedure(s) Performed: EXCISION MASS OF RIGHT PALATAL  WITH LOCAL SOFT TISSUE RECONSTRUCTION (Right )  Patient Location: PACU  Anesthesia Type:General  Level of Consciousness: drowsy and patient cooperative  Airway & Oxygen Therapy: Patient Spontanous Breathing and Patient connected to face mask oxygen  Post-op Assessment: Report given to RN and Post -op Vital signs reviewed and stable  Post vital signs: Reviewed and stable  Last Vitals:  Vitals Value Taken Time  BP    Temp    Pulse    Resp    SpO2      Last Pain:  Vitals:   05/10/18 0919  PainSc: 0-No pain         Complications: No apparent anesthesia complications

## 2018-05-10 NOTE — H&P (Signed)
Casey Kirk is an 72 y.o. female.   Chief Complaint: Right posterior soft palate mass HPI: Patient with a history of asymptomatic swelling in the right posterior soft palate.  She was evaluated for dentures and was found to have an asymptomatic mass.  This is been gradually enlarging over the last several months.  No ulcer or lesion, no lymphadenopathy.  Past Medical History:  Diagnosis Date  . Allergic rhinitis   . Aortic atherosclerosis ([place]) 08/17/2016  . Arthritis   . ARTHRITIS, HX OF 01/09/2007  . BIPOLAR II DISORDER 03/17/2008  . 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 COPD 12/21/2006  . COPD (chronic obstructive pulmonary disease) ([place]) 12/21/2006   Emphysema on CT 06/2016 02/2016 FEV1 of 98%    . Coronary artery calcification 08/17/2016  . Coronary artery disease involving native coronary artery of native heart with unstable angina pectoris ([place])   . Dysphasia 05/10/2011  . Family history of early CAD 08/17/2016  . GERD (gastroesophageal reflux disease)   . GOUT 01/09/2007  . Hx of colonic polyps   . Hypothyroidism 10/17/2010  . IBS (irritable bowel syndrome)   . Osteoporosis   . Sinusitis   . Thyroid disease   . TINEA CORPORIS 10/06/2009  . Tobacco abuse 05/10/2011    Past Surgical History:  Procedure Laterality Date  . ABDOMINAL HYSTERECTOMY     CA cells removed end of vagina  . BREAST SURGERY     biopsy  . COLONOSCOPY    . CORONARY STENT INTERVENTION N/A 03/16/2017   Procedure: CORONARY STENT INTERVENTION;  Surgeon: Burnell Blanks, MD;  Location: [facility] LAB;  Service: Cardiovascular;  Laterality: N/A;  . EYE SURGERY Right    cataract  . INTRAVASCULAR PRESSURE WIRE/FFR STUDY N/A 03/16/2017   Procedure: INTRAVASCULAR PRESSURE WIRE/FFR STUDY;  Surgeon: Burnell Blanks, MD;  Location: [facility] CV LAB;  Service: Cardiovascular;  Laterality: N/A;  . LEFT HEART CATH AND CORONARY ANGIOGRAPHY N/A 03/16/2017   Procedure: LEFT HEART CATH AND CORONARY  ANGIOGRAPHY;  Surgeon: Burnell Blanks, MD;  Location: [facility] CV LAB;  Service: Cardiovascular;  Laterality: N/A;  . LEFT HEART CATH AND CORONARY ANGIOGRAPHY N/A 01/30/2018   Procedure: LEFT HEART CATH AND CORONARY ANGIOGRAPHY;  Surgeon: Burnell Blanks, MD;  Location: [facility] LAB;  Service: Cardiovascular;  Laterality: N/A;  . TOOTH EXTRACTION    . vaginal carcinoma surgery      Family History  Problem Relation Age of Onset  . Alcohol abuse Other   . Arthritis Other   . Diabetes Other   . Hyperlipidemia Other   . Hypertension Other   . Cancer Other        ovarian, uterine  . Heart disease Other   . Sudden death Other   . Colon polyps Mother   . Tremor Mother   . COPD Mother   . Colon polyps Brother   . Heart attack Brother 65  . Colon cancer Neg Hx    Social History:  reports that she has been smoking cigarettes. She has a 20.00 pack-year smoking history. She has never used smokeless tobacco. She reports current alcohol use. She reports that she does not use drugs.  Allergies:  Allergies  Allergen Reactions  . Hydrocodone-Acetaminophen Itching and Swelling  . Latex Itching and Swelling  . Pneumococcal Polysaccharide Vaccine Swelling    Fever    Medications Prior to Admission  Medication Sig Dispense Refill  . acetaminophen (TYLENOL) 500 MG tablet  Take 500 mg by mouth every 8 (eight) hours as needed (pain).    Marland Kitchen albuterol (PROVENTIL HFA;VENTOLIN HFA) 108 (90 Base) MCG/ACT inhaler Inhale 2 puffs into the lungs every 6 (six) hours as needed for wheezing or shortness of breath. 1 Inhaler 5  . aspirin 81 MG tablet Take 81 mg by mouth daily.      Marland Kitchen CALCIUM/MAGNESIUM/ZINC FORMULA PO Take 1 tablet by mouth 3 (three) times a week.    . cholecalciferol (VITAMIN D) 1000 units tablet Take 1,000 Units by mouth daily.    . diphenhydrAMINE (BENADRYL) 25 mg capsule Take 25 mg by mouth daily as needed for allergies.    . fish oil-omega-3 fatty acids 1000 MG  capsule Take 1 g by mouth 2 (two) times a week.     . fluticasone (FLONASE) 50 MCG/ACT nasal spray Place 2 sprays into both nostrils daily as needed for allergies or rhinitis.    Marland Kitchen lamoTRIgine (LAMICTAL) 25 MG tablet Take 1 tablet by mouth daily as needed (stress).     . Melatonin 3 MG TABS Take 3 mg by mouth at bedtime.    . Multiple Vitamin (MULTIVITAMIN) tablet Take 1 tablet by mouth daily.      . rosuvastatin (CRESTOR) 20 MG tablet Take 20 mg by mouth daily.     . traZODone (DESYREL) 50 MG tablet Take 50 mg by mouth at bedtime.    . Turmeric Curcumin 500 MG CAPS Take 1 capsule by mouth every other day.    . vitamin C (ASCORBIC ACID) 500 MG tablet Take 500 mg by mouth daily.    . vitamin E 100 UNIT capsule Take 100 Units by mouth daily.       No results found for this or any previous visit (from the past 48 hour(s)). No results found.  Review of Systems  Constitutional: Negative.   HENT: Negative.   Respiratory: Negative.   Cardiovascular: Negative.     Blood pressure (!) 144/58, pulse 65, temperature 98.7 F (37.1 C), resp. rate 18, height 5\' 2"  (1.575 m), weight 62.7 kg, SpO2 99 %. Physical Exam  Constitutional: She appears well-developed and well-nourished.  HENT:  Right posterior soft palate mass  Neck: Normal range of motion. Neck supple.  Cardiovascular: Normal rate.  Respiratory: Effort normal.  GI: Soft.     Assessment/Plan Admit for excision of soft palatal mass with local reconstruction as an outpatient with possible overnight observation.  Jerrell Belfast, MD 05/10/2018, 9:57 AM

## 2018-05-10 NOTE — Anesthesia Procedure Notes (Signed)
Procedure Name: Intubation Date/Time: 05/10/2018 10:39 AM Performed by: Gwyndolyn Saxon, CRNA Pre-anesthesia Checklist: Patient identified, Emergency Drugs available, Suction available and Patient being monitored Patient Re-evaluated:Patient Re-evaluated prior to induction Oxygen Delivery Method: Circle system utilized Preoxygenation: Pre-oxygenation with 100% oxygen Induction Type: IV induction Ventilation: Mask ventilation without difficulty Laryngoscope Size: Miller and 2 Grade View: Grade I Tube type: Oral Tube size: 7.0 mm Number of attempts: 1 Airway Equipment and Method: Stylet Placement Confirmation: ETT inserted through vocal cords under direct vision,  positive ETCO2 and breath sounds checked- equal and bilateral Secured at: 21 cm Tube secured with: Tape Dental Injury: Teeth and Oropharynx as per pre-operative assessment

## 2018-05-10 NOTE — Op Note (Signed)
Operative Note: Excision of Palatal Mass  Patient: Casey Kirk record number: [id number]  Date:05/10/2018  Pre-operative Indications: Right soft palate mass  Postoperative Indications: Same  Surgical Procedure: 1.  Excision of soft palatal mass    2.  Rotational flap reconstruction  Anesthesia: GET  Surgeon: Delsa Bern, M.D.  Assist: None  Complications: None  EBL: Less than 50 cc   Brief History: The patient is a 72 y.o. female with a history of asymptomatic swelling of the right soft palate.  The patient was fit for dentures and had difficulty wearing them because of fullness and swelling.  No ulcer or lesion, the patient is a chronic smoker. Given the patient's history and findings I recommended excision of the mass with local reconstruction under general anesthesia, risks and benefits were discussed in detail with the patient and their family. They understand and agree with our plan for surgery which is scheduled at [facility] on an elective basis.  Surgical Procedure: The patient is brought to the operating room on 05/10/2018 and placed in supine position on the operating table. General endotracheal anesthesia was established without difficulty. When the patient was adequately anesthetized, surgical timeout was performed and correct identification of the patient and the surgical procedure. The patient was positioned and prepped and draped in sterile fashion.  The patient's oral cavity was examined she had a firm submucosal mass in the right posterior lateral soft palate adjacent to the tonsillar fossa.  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  The surrounding soft tissue was infiltrated with 1 cc of 1% lidocaine with 1: 100,000 epinephrine.  After allowing adequate time for vasoconstriction Bovie electrocautery was used to resect the entire mass and overlying mucosa.  The mass was free of the deep palatal musculature which was  preserved.  The mass was marked for pathology and sent for gross and microscopic evaluation.  The patient had an approximately 2 x 3 cm defect in the soft palate.  Reconstruction of the palate defect was undertaken by creating a soft tissue rotation flap from the right lateral buccal region.  This was an inferiorly broad-based rotation flap measuring approximately 2 x 3 cm.  The tissue flap was demarcated with Bovie electrocautery and then dissected free from the underlying deep tissue.  The lap was then rotated into position and sutured with interrupted 4-0 chromic suture on a tapered needle in an interrupted fashion.  The left buccal donor site was then closed with interrupted 4-0 chromic suture.  The patient's oral cavity was irrigated and suctioned.  There was no bleeding or swelling.   An orogastric tube was passed and stomach contents were aspirated. Patient was awakened from anesthetic and transferred from the operating room to the recovery room in stable condition. There were no complications and blood loss was minimal.   Delsa Bern, M.D. [facility] 05/10/2018

## 2018-05-11 ENCOUNTER — Encounter (HOSPITAL_COMMUNITY): Payer: Self-pay | Admitting: Otolaryngology

## 2018-07-09 ENCOUNTER — Ambulatory Visit (INDEPENDENT_AMBULATORY_CARE_PROVIDER_SITE_OTHER): Payer: PPO | Admitting: Psychiatry

## 2018-07-09 DIAGNOSIS — Z8659 Personal history of other mental and behavioral disorders: Secondary | ICD-10-CM

## 2018-07-09 DIAGNOSIS — F3170 Bipolar disorder, currently in remission, most recent episode unspecified: Secondary | ICD-10-CM | POA: Diagnosis not present

## 2018-07-09 MED ORDER — LAMOTRIGINE 25 MG PO TABS: 25.0000 mg | ORAL_TABLET | Freq: Every day | ORAL | 1 refills | Status: DC

## 2018-07-09 MED ORDER — TRAZODONE HCL 50 MG PO TABS: 50.0000 mg | ORAL_TABLET | Freq: Every day | ORAL | 1 refills | Status: DC

## 2018-07-09 NOTE — Progress Notes (Signed)
[facility] Med Check  Patient ID: Casey Kirk,  MRN: [id number]  PCP: Lawerance Cruel, MD  Date of Evaluation: 07/09/2018 Time spent:20 minutes  Chief Complaint:   HISTORY/CURRENT STATUS: HPI seen in September of last year and was doing well.  None she continues to do well.  She did have a cardiac stent in 2018 where she had an 80% blockage.  Individual Medical History/ Review of Systems: Changes? :No   Allergies: Hydrocodone-acetaminophen; Latex; and Pneumococcal polysaccharide vaccine  Current Medications:  Current Outpatient Medications:  .  lamoTRIgine (LAMICTAL) 25 MG tablet, Take 1 tablet by mouth daily as needed (stress)., Disp: , Rfl:  .  traZODone (DESYREL) 50 MG tablet, Take 1 tablet (50 mg total) by mouth at bedtime., Disp: 90 tablet, Rfl: 1 .  acetaminophen (TYLENOL) 500 MG tablet, Take 500 mg by mouth every 8 (eight) hours as needed (pain)., Disp: , Rfl:  .  albuterol (PROVENTIL HFA;VENTOLIN HFA) 108 (90 Base) MCG/ACT inhaler, Inhale 2 puffs into the lungs every 6 (six) hours as needed for wheezing or shortness of breath., Disp: 1 Inhaler, Rfl: 5 .  CALCIUM/MAGNESIUM/ZINC FORMULA PO, Take 1 tablet by mouth 3 (three) times a week., Disp: , Rfl:  .  cephALEXin (KEFLEX) 500 MG capsule, Take 1 capsule (500 mg total) by mouth 3 (three) times daily., Disp: 30 capsule, Rfl: 1 .  cholecalciferol (VITAMIN D) 1000 units tablet, Take 1,000 Units by mouth daily., Disp: , Rfl:  .  diphenhydrAMINE (BENADRYL) 25 mg capsule, Take 25 mg by mouth daily as needed for allergies., Disp: , Rfl:  .  fish oil-omega-3 fatty acids 1000 MG capsule, Take 1 g by mouth 2 (two) times a week. , Disp: , Rfl:  .  fluticasone (FLONASE) 50 MCG/ACT nasal spray, Place 2 sprays into both nostrils daily as needed for allergies or rhinitis., Disp: , Rfl:  .  lamoTRIgine (LAMICTAL) 25 MG tablet, Take 1 tablet (25 mg total) by mouth daily., Disp: 90 tablet, Rfl: 1 .  Melatonin 3 MG TABS, Take 3 mg by  mouth at bedtime., Disp: , Rfl:  .  Multiple Vitamin (MULTIVITAMIN) tablet, Take 1 tablet by mouth daily.  , Disp: , Rfl:  .  rosuvastatin (CRESTOR) 20 MG tablet, Take 20 mg by mouth daily. , Disp: , Rfl:  .  Turmeric Curcumin 500 MG CAPS, Take 1 capsule by mouth every other day., Disp: , Rfl:  .  vitamin C (ASCORBIC ACID) 500 MG tablet, Take 500 mg by mouth daily., Disp: , Rfl:  .  vitamin E 100 UNIT capsule, Take 100 Units by mouth daily. , Disp: , Rfl:  Medication Side Effects: none  Family Medical/ Social History: Changes? No change  MENTAL HEALTH EXAM:  There were no vitals taken for this visit.There is no height or weight on file to calculate BMI.  General Appearance: Casual  Eye Contact:  Good  Speech:  Clear and Coherent  Volume:  Normal  Mood:  Euthymic  Affect:  Appropriate  Thought Process:  Linear  Orientation:  Full (Time, Place, and Person)  Thought Content: Logical   Suicidal Thoughts:  No  Homicidal Thoughts:  No  Memory:  WNL  Judgement:  Good  Insight:  Good  Psychomotor Activity:  Normal  Concentration:  Concentration: Good  Recall:  Good  Fund of Knowledge: Good  Language: Good  Assets:  Desire for Improvement  ADL's:  Intact  Cognition: WNL  Prognosis:  Good    DIAGNOSES:  ICD-10-CM   1. Bipolar disorder in full remission, most recent episode unspecified type ([place]) F31.70   2. History of psychosis Z86.59     Receiving Psychotherapy: No    RECOMMENDATIONS: She is doing well.  She will continue the Lamictal and trazodone. I mention again in getting a sleep study.  We have talked about this before but she said her family physician did not feel like she needed 1.  The patient does not snore.  I am recommending a sleep study as she had a cardiac stent 2018 with 80% blockage. Declines mood stabler.    [name], PA-C

## 2018-07-23 ENCOUNTER — Encounter: Payer: Self-pay | Admitting: Cardiology

## 2018-07-23 ENCOUNTER — Ambulatory Visit: Payer: PPO | Admitting: Cardiology

## 2018-07-23 VITALS — BP 120/80 | HR 78 | Ht 62.0 in | Wt 137.8 lb

## 2018-07-23 DIAGNOSIS — I2510 Atherosclerotic heart disease of native coronary artery without angina pectoris: Secondary | ICD-10-CM | POA: Diagnosis not present

## 2018-07-23 DIAGNOSIS — Z955 Presence of coronary angioplasty implant and graft: Secondary | ICD-10-CM

## 2018-07-23 DIAGNOSIS — Z720 Tobacco use: Secondary | ICD-10-CM | POA: Diagnosis not present

## 2018-07-23 DIAGNOSIS — I700 Atherosclerosis of aorta: Secondary | ICD-10-CM | POA: Diagnosis not present

## 2018-07-23 NOTE — Progress Notes (Signed)
Cardiology Office Note:    Date:  07/23/2018   ID:  Casey Kirk, DOB [birth year], MRN [id number]  PCP:  Lawerance Cruel, MD  Cardiologist:  Candee Furbish, MD   Referring MD: Lawerance Cruel, MD     History of Present Illness:    Casey Kirk is a 73 y.o. female with COPD, bipolar disorder, GERD, IBS, hyperlipidemia, thyroid disease with coronary calcification on CT scan screening with symptoms of fatigue and chest pain.  She went forward with heart catheterization on 03/16/17 showed an 80% mid LAD stenosis FFR 0.7.  DES x1 PCI performed.  She did well.  Same day discharge.  She was doing quite well at her last clinic visit on 03/29/17.  Still having difficulty with smoking.  Bilateral upper leg pain with ambulation.  ABIs were normal on 05/09/17.  Chest soreness at times, left check.   Carotid Dopplers on 04/17/17: 1. Mild amount of calcified plaque at the level of the left carotid bulb and proximal ICA with estimated left ICA stenosis of less than 50%. 2. Minimal plaque at the level of the right carotid bulb. No evidence of right ICA stenosis.  07/23/2018- here for the follow-up of coronary artery disease prior PCI to LAD.  She is now on aspirin only.  Doing well.  Had some bruising on Plavix.  Feels good without any anginal symptoms.  She did ask about some redness in the palms of her hands, some cracking.  I do not have a good explanation for this from a cardiac perspective.  Overall doing well.  No fevers chills nausea vomiting syncope bleeding.  No side effects with Crestor.   Past Medical History:  Diagnosis Date  . Allergic rhinitis   . Aortic atherosclerosis ([place]) 08/17/2016  . Arthritis   . ARTHRITIS, HX OF 01/09/2007  . BIPOLAR II DISORDER 03/17/2008  . 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 COPD 12/21/2006  . COPD (chronic obstructive pulmonary disease) ([place]) 12/21/2006   Emphysema on CT 06/2016 02/2016 FEV1 of 98%    . Coronary artery calcification 08/17/2016  .  Coronary artery disease involving native coronary artery of native heart with unstable angina pectoris ([place])   . Dysphasia 05/10/2011  . Family history of early CAD 08/17/2016  . GERD (gastroesophageal reflux disease)   . GOUT 01/09/2007  . Hx of colonic polyps   . Hypothyroidism 10/17/2010  . IBS (irritable bowel syndrome)   . Osteoporosis   . Sinusitis   . Thyroid disease   . TINEA CORPORIS 10/06/2009  . Tobacco abuse 05/10/2011    Past Surgical History:  Procedure Laterality Date  . ABDOMINAL HYSTERECTOMY     CA cells removed end of vagina  . BREAST SURGERY     biopsy  . COLONOSCOPY    . CORONARY STENT INTERVENTION N/A 03/16/2017   Procedure: CORONARY STENT INTERVENTION;  Surgeon: Burnell Blanks, MD;  Location: [facility];  Service: Cardiovascular;  Laterality: N/A;  . EYE SURGERY Right    cataract  . INTRAVASCULAR PRESSURE WIRE/FFR STUDY N/A 03/16/2017   Procedure: INTRAVASCULAR PRESSURE WIRE/FFR STUDY;  Surgeon: Burnell Blanks, MD;  Location: [facility];  Service: Cardiovascular;  Laterality: N/A;  . LEFT HEART CATH AND CORONARY ANGIOGRAPHY N/A 03/16/2017   Procedure: LEFT HEART CATH AND CORONARY ANGIOGRAPHY;  Surgeon: Burnell Blanks, MD;  Location: [facility];  Service: Cardiovascular;  Laterality: N/A;  . LEFT HEART CATH AND CORONARY ANGIOGRAPHY N/A 01/30/2018  Procedure: LEFT HEART CATH AND CORONARY ANGIOGRAPHY;  Surgeon: Burnell Blanks, MD;  Location: [facility];  Service: Cardiovascular;  Laterality: N/A;  . MASS EXCISION Right 05/10/2018   Procedure: EXCISION MASS OF RIGHT PALATAL  WITH LOCAL SOFT TISSUE RECONSTRUCTION;  Surgeon: Jerrell Belfast, MD;  Location: [facility];  Service: ENT;  Laterality: Right;  . TOOTH EXTRACTION    . vaginal carcinoma surgery      Current Medications: Current Meds  Medication Sig  . acetaminophen (TYLENOL) 500 MG tablet Take 500 mg by mouth every 8 (eight) hours as needed  (pain).  Marland Kitchen albuterol (PROVENTIL HFA;VENTOLIN HFA) 108 (90 Base) MCG/ACT inhaler Inhale 2 puffs into the lungs every 6 (six) hours as needed for wheezing or shortness of breath.  Marland Kitchen aspirin EC 81 MG tablet Take 81 mg by mouth daily.  Marland Kitchen CALCIUM/MAGNESIUM/ZINC FORMULA PO Take 1 tablet by mouth 3 (three) times a week.  . cephALEXin (KEFLEX) 500 MG capsule Take 1 capsule (500 mg total) by mouth 3 (three) times daily.  . cholecalciferol (VITAMIN D) 1000 units tablet Take 1,000 Units by mouth daily.  . diphenhydrAMINE (BENADRYL) 25 mg capsule Take 25 mg by mouth daily as needed for allergies.  . fish oil-omega-3 fatty acids 1000 MG capsule Take 1 g by mouth 2 (two) times a week.   . fluticasone (FLONASE) 50 MCG/ACT nasal spray Place 2 sprays into both nostrils daily as needed for allergies or rhinitis.  Marland Kitchen lamoTRIgine (LAMICTAL) 25 MG tablet Take 1 tablet by mouth daily as needed (stress).  . Melatonin 3 MG TABS Take 3 mg by mouth at bedtime.  . Multiple Vitamin (MULTIVITAMIN) tablet Take 1 tablet by mouth daily.    . rosuvastatin (CRESTOR) 20 MG tablet Take 20 mg by mouth daily.   . traZODone (DESYREL) 50 MG tablet Take 1 tablet (50 mg total) by mouth at bedtime.  . Turmeric Curcumin 500 MG CAPS Take 1 capsule by mouth every other day.  . vitamin C (ASCORBIC ACID) 500 MG tablet Take 500 mg by mouth daily.  . vitamin E 100 UNIT capsule Take 100 Units by mouth daily.      Allergies:   Hydrocodone-acetaminophen; Latex; and Pneumococcal polysaccharide vaccine   Social History   Socioeconomic History  . Marital status: Married    Spouse name: Not on file  . Number of children: 2  . Years of education: Not on file  . Highest education level: Not on file  Occupational History  . Occupation: retired  Scientific laboratory technician  . Financial resource strain: Not on file  . Food insecurity:    Worry: Not on file    Inability: Not on file  . Transportation needs:    Medical: Not on file    Non-medical: Not on  file  Tobacco Use  . Smoking status: Current Every Day Smoker    Packs/day: 0.50    Years: 40.00    Pack years: 20.00    Types: Cigarettes  . Smokeless tobacco: Never Used  . Tobacco comment: Pt states that she is currently smoking about 5cigarettes a day now.  Substance and Sexual Activity  . Alcohol use: Yes    Comment: Rarely   . Drug use: No  . Sexual activity: Yes  Lifestyle  . Physical activity:    Days per week: Not on file    Minutes per session: Not on file  . Stress: Not on file  Relationships  . Social connections:    Talks  on phone: Not on file    Gets together: Not on file    Attends religious service: Not on file    Active member of club or organization: Not on file    Attends meetings of clubs or organizations: Not on file    Relationship status: Not on file  Other Topics Concern  . Not on file  Social History Narrative   Lives alone.  She has two grown children.   She is retired from working at [employer].   Highest level of education:  2 year business college     Family History: The patient's family history includes Alcohol abuse in an other family member; Arthritis in an other family member; COPD in her mother; Cancer in an other family member; Colon polyps in her brother and mother; Diabetes in an other family member; Heart attack (age of onset: 69) in her brother; Heart disease in an other family member; Hyperlipidemia in an other family member; Hypertension in an other family member; Sudden death in an other family member; Tremor in her mother. There is no history of Colon cancer.  ROS:   Please see the history of present illness.     All other systems reviewed and are negative.  EKGs/Labs/Other Studies Reviewed:    The following studies were reviewed today: Prior office notes, lab work, catheterization reviewed  EKG: no new ecg  Recent Labs: 01/24/2018: Platelets 216 04/30/2018: BUN 7; Creatinine, Ser 0.86; Hemoglobin 15.0; Potassium 4.3; Sodium 145    Recent Lipid Panel    Component Value Date/Time   CHOL 107 05/10/2017 0832   TRIG 94 05/10/2017 0832   HDL 51 05/10/2017 0832   CHOLHDL 2.1 05/10/2017 0832   CHOLHDL 5 09/22/2010 0843   VLDL 20.6 09/22/2010 0843   LDLCALC 37 05/10/2017 0832    Physical Exam:    VS:  BP 120/80   Pulse 78   Ht 5\' 2"  (1.575 m)   Wt 137 lb 12.8 oz (62.5 kg)   SpO2 99%   BMI 25.20 kg/m     Wt Readings from Last 3 Encounters:  07/23/18 137 lb 12.8 oz (62.5 kg)  05/10/18 138 lb 3.2 oz (62.7 kg)  04/30/18 138 lb 3.2 oz (62.7 kg)     GEN: Well nourished, well developed, in no acute distress  HEENT: normal  Neck: no JVD, carotid bruits, or masses Cardiac: RRR; no murmurs, rubs, or gallops,no edema  Respiratory:  clear to auscultation bilaterally, normal work of breathing GI: soft, nontender, nondistended, + BS MS: no deformity or atrophy  Skin: warm and dry, no rash, minor redness palms hands bilaterally Neuro:  Alert and Oriented x 3, Strength and sensation are intact Psych: euthymic mood, full affect   ASSESSMENT:    1. Coronary artery disease involving native coronary artery of native heart without angina pectoris   2. Aortic atherosclerosis (HCC)   3. Status post coronary artery stent placement   4. Tobacco abuse    PLAN:    In order of problems listed above:  Coronary artery disease -Status post LAD stent 03/16/17.  Now on aspirin 81 mg only, was having Plavix bruising previous.  No anginal symptoms.  Tobacco use -Continue with tobacco cessation.  Discussed.  Reduce his risk for future cardiovascular events.  Excellent.  Mouth surgery with Dr. Wilburn Cornelia.  Continuing to heal.  Leg pain -ABIs were reassuring.  No changes.  COPD -Tobacco cessation.  No changes  Hyperlipidemia - Excellent job with Crestor.  LDL 38.  Wonderful.  Doing well without any myalgias.  Aortic atherosclerosis -Continue with secondary prevention.  Crestor.  Aspirin.  Redness in her palms with some  stiffness - I do not have a good cardiovascular reason for this.  Could this be a medication side effect?.  Continue with lotion.  39yr fu   Medication Adjustments/Labs and Tests Ordered: Current medicines are reviewed at length with the patient today.  Concerns regarding medicines are outlined above.  No orders of the defined types were placed in this encounter.  No orders of the defined types were placed in this encounter.   Signed, Candee Furbish, MD  07/23/2018 10:35 AM    [place]

## 2018-07-23 NOTE — Patient Instructions (Signed)
Medication Instructions:  The current medical regimen is effective;  continue present plan and medications.  If you need a refill on your cardiac medications before your next appointment, please call your pharmacy.   Follow-Up: At [facility], you and your health needs are our priority.  As part of our continuing mission to provide you with exceptional heart care, we have created designated Provider Care Teams.  These Care Teams include your primary Cardiologist (physician) and Advanced Practice Providers (APPs -  Physician Assistants and Nurse Practitioners) who all work together to provide you with the care you need, when you need it. You will need a follow up appointment in 12 months.  Please call our office 2 months in advance to schedule this appointment.  You may see Mark Skains, MD or one of the following Advanced Practice Providers on your designated Care Team:   Lori Gerhardt, NP Laura Ingold, NP . Jill McDaniel, NP  Thank you for choosing [facility]!!      

## 2018-09-10 DIAGNOSIS — R197 Diarrhea, unspecified: Secondary | ICD-10-CM | POA: Diagnosis not present

## 2018-10-28 DIAGNOSIS — C069 Malignant neoplasm of mouth, unspecified: Secondary | ICD-10-CM | POA: Diagnosis not present

## 2018-12-20 DIAGNOSIS — N842 Polyp of vagina: Secondary | ICD-10-CM | POA: Diagnosis not present

## 2018-12-20 DIAGNOSIS — Z01419 Encounter for gynecological examination (general) (routine) without abnormal findings: Secondary | ICD-10-CM | POA: Diagnosis not present
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
## 2018-12-23 DIAGNOSIS — Z1231 Encounter for screening mammogram for malignant neoplasm of breast: Secondary | ICD-10-CM | POA: Diagnosis not present

## 2018-12-25 ENCOUNTER — Telehealth: Payer: Self-pay | Admitting: Pulmonary Disease

## 2018-12-25 NOTE — Telephone Encounter (Signed)
Spoke with pt. Advised her that she is scheduled for a Lung Cancer Screening CT on 02/11/2019. She is aware that she does not need a CT prior to that date due to insurance and issues with scheduling due to [place]. Pt verbalized understanding. Nothing further was needed.

## 2019-01-07 ENCOUNTER — Other Ambulatory Visit: Payer: Self-pay

## 2019-01-07 ENCOUNTER — Ambulatory Visit: Payer: PPO | Admitting: Psychiatry

## 2019-01-07 ENCOUNTER — Ambulatory Visit (INDEPENDENT_AMBULATORY_CARE_PROVIDER_SITE_OTHER): Payer: PPO | Admitting: Physician Assistant

## 2019-01-07 ENCOUNTER — Encounter: Payer: Self-pay | Admitting: Physician Assistant

## 2019-01-07 DIAGNOSIS — F4329 Adjustment disorder with other symptoms: Secondary | ICD-10-CM

## 2019-01-07 DIAGNOSIS — F99 Mental disorder, not otherwise specified: Secondary | ICD-10-CM

## 2019-01-07 DIAGNOSIS — F5105 Insomnia due to other mental disorder: Secondary | ICD-10-CM | POA: Diagnosis not present

## 2019-01-07 MED ORDER — TRAZODONE HCL 50 MG PO TABS: 25.0000 mg | ORAL_TABLET | Freq: Every evening | ORAL | 1 refills | Status: DC | PRN

## 2019-01-07 MED ORDER — LAMOTRIGINE 25 MG PO TABS: 25.0000 mg | ORAL_TABLET | Freq: Every day | ORAL | 0 refills | Status: DC | PRN

## 2019-01-07 NOTE — Progress Notes (Signed)
[facility] Med Check  Patient ID: Casey Kirk,  MRN: [id number]  PCP: Lawerance Cruel, MD  Date of Evaluation: 01/07/2019 Time spent:30 minutes  Chief Complaint:  Chief Complaint    Depression; Follow-up     Virtual Visit via Telephone Note  I connected with patient by a video enabled telemedicine application or telephone, with their informed consent, and verified patient privacy and that I am speaking with the correct person using two identifiers.  I am private, in my home and the patient is home.  I discussed the limitations, risks, security and privacy concerns of performing an evaluation and management service by telephone and the availability of in person appointments. I also discussed with the patient that there may be a patient responsible charge related to this service. The patient expressed understanding and agreed to proceed.   I discussed the assessment and treatment plan with the patient. The patient was provided an opportunity to ask questions and all were answered. The patient agreed with the plan and demonstrated an understanding of the instructions.   The patient was advised to call back or seek an in-person evaluation if the symptoms worsen or if the condition fails to improve as anticipated.  I provided 30 minutes of non-face-to-face time during this encounter.  HISTORY/CURRENT STATUS: HPI For routine 6 month med check.  transferred to my care after her provider and my colleague, [name], [place] passed away recently.  She was seen last 6 months ago and has been doing well since that time.  States she takes Lamictal on an as-needed basis for "stress.".  Does not need it often but when she takes it it is helpful.  Her stress is mostly related to family issues.  At times, but not often, she can be overwhelmed.  Has more anxiety when that happens, feels jittery and nervous.  When she takes the Lamictal it goes away.  She sleeps well as long as she has  trazodone.  Has trouble falling asleep and staying asleep if she does not take it.  She has no side effects such as increased fatigue or drowsiness the day after taking it.  Is currently trying to quit smoking.  Is on Chantix.  States that it has helped her decrease the amount she smokes.  But if she is more stressed than that we will increase again.  Her PCP is giving her this Chantix.  While reviewing her records, I see the diagnosis of bipolar disorder with previous psychosis.  She told me that in 2008 she had "a breakdown.".  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  She was only getting 3 to 4 hours of sleep every 24 hours for an entire year.  States that she became so overwhelmed that she had a "breakdown" that was diagnosed as bipolar disorder.  She has never had any episodic cyclical mood swings in her life.  Patient denies loss of interest in usual activities and is able to enjoy things.  Denies decreased energy or motivation.  Appetite has not changed.  No extreme sadness, tearfulness, or feelings of hopelessness.  Denies any changes in concentration, making decisions or remembering things.  Denies suicidal or homicidal thoughts.  Patient denies increased energy with decreased need for sleep, no increased talkativeness, no racing thoughts, no impulsivity or risky behaviors, no increased spending, no increased libido, no grandiosity.  Denies dizziness, syncope, seizures, numbness, tingling,  tremor, tics, unsteady gait, slurred speech, confusion. Denies muscle or joint pain, stiffness, or dystonia.  Individual Medical History/ Review of Systems: Changes? :No    Past medications for mental health diagnoses include: uncertain  Allergies: Hydrocodone-acetaminophen, Latex, and Pneumococcal polysaccharide vaccine  Current Medications:  Current Outpatient Medications:  .  acetaminophen (TYLENOL) 500 MG tablet,  Take 500 mg by mouth every 8 (eight) hours as needed (pain)., Disp: , Rfl:  .  albuterol (PROVENTIL HFA;VENTOLIN HFA) 108 (90 Base) MCG/ACT inhaler, Inhale 2 puffs into the lungs every 6 (six) hours as needed for wheezing or shortness of breath., Disp: 1 Inhaler, Rfl: 5 .  aspirin EC 81 MG tablet, Take 81 mg by mouth daily., Disp: , Rfl:  .  CALCIUM/MAGNESIUM/ZINC FORMULA PO, Take 1 tablet by mouth 3 (three) times a week., Disp: , Rfl:  .  CHANTIX STARTING MONTH PAK 0.5 MG X 11 & 1 MG X 42 tablet, See admin instructions., Disp: , Rfl:  .  cholecalciferol (VITAMIN D) 1000 units tablet, Take 1,000 Units by mouth daily., Disp: , Rfl:  .  diphenhydrAMINE (BENADRYL) 25 mg capsule, Take 25 mg by mouth daily as needed for allergies., Disp: , Rfl:  .  diphenoxylate-atropine (LOMOTIL) 2.5-0.025 MG tablet, TAKE 2 TABLETS BY MOUTH AS NEEDED 4 TIMES A DAY, Disp: , Rfl:  .  fish oil-omega-3 fatty acids 1000 MG capsule, Take 1 g by mouth 2 (two) times a week. qod, Disp: , Rfl:  .  fluticasone (FLONASE) 50 MCG/ACT nasal spray, Place 2 sprays into both nostrils daily as needed for allergies or rhinitis., Disp: , Rfl:  .  lamoTRIgine (LAMICTAL) 25 MG tablet, Take 1 tablet (25 mg total) by mouth daily as needed (stress)., Disp: 90 tablet, Rfl: 0 .  Melatonin 3 MG TABS, Take 3 mg by mouth at bedtime., Disp: , Rfl:  .  Multiple Vitamin (MULTIVITAMIN) tablet, Take 1 tablet by mouth daily.  , Disp: , Rfl:  .  rosuvastatin (CRESTOR) 20 MG tablet, Take 20 mg by mouth daily. , Disp: , Rfl:  .  traZODone (DESYREL) 50 MG tablet, Take 0.5-1 tablets (25-50 mg total) by mouth at bedtime as needed., Disp: 90 tablet, Rfl: 1 .  Turmeric Curcumin 500 MG CAPS, Take 1 capsule by mouth every other day., Disp: , Rfl:  .  vitamin C (ASCORBIC ACID) 500 MG tablet, Take 500 mg by mouth daily., Disp: , Rfl:  .  vitamin E 100 UNIT capsule, Take 100 Units by mouth daily. , Disp: , Rfl:  .  cephALEXin (KEFLEX) 500 MG capsule, Take 1 capsule  (500 mg total) by mouth 3 (three) times daily. (Patient not taking: Reported on 01/07/2019), Disp: 30 capsule, Rfl: 1 Medication Side Effects: none  Family Medical/ Social History: Changes? No  MENTAL HEALTH EXAM:  There were no vitals taken for this visit.There is no height or weight on file to calculate BMI.  General Appearance: unable to assess  Eye Contact:  unable to assess  Speech:  Clear and Coherent  Volume:  Normal  Mood:  Euthymic  Affect:  unable to assess  Thought Process:  Goal Directed  Orientation:  Full (Time, Place, and Person)  Thought Content: Logical   Suicidal Thoughts:  No  Homicidal Thoughts:  No  Memory:  WNL  Judgement:  Good  Insight:  Good  Psychomotor Activity:  unable to assess  Concentration:  Concentration: Good  Recall:  Good  Fund of Knowledge: Good  Language: Good  Assets:  Desire for Improvement  ADL's:  Intact  Cognition: WNL  Prognosis:  Good    DIAGNOSES:    ICD-10-CM   1. Insomnia due to other mental disorder  F51.05    F99   2. Stress and adjustment reaction  F43.29     Receiving Psychotherapy: No    RECOMMENDATIONS:  I spent 30 minutes with her and 50% of that time was spent in counseling concerning the differential diagnosis and treatment options.  Given the history, I do not believe she has bipolar disorder.  It sounds like the episode she had in 2008 was most likely due to sleep deprivation, stress, and anxiety.  However, we discussed that bipolar disorder is extremely difficult to diagnose at times and I will certainly be watching for signs and symptoms.  She has been doing well on the current treatment for several years and she does not want to change anything.  I am in agreement since she is doing well.  She knows to let me know if there are any symptoms of depression or mania which I have described. Continue Lamictal 25 mg qd prn. For 'stress' Continue Trazodone 50mg  1/2-1 po qhs prn. Return in 6 months.  Donnal Moat,  PA-C   This record has been created using [employer].  Chart creation errors have been sought, but may not always have been located and corrected. Such creation errors do not reflect on the standard of medical care.

## 2019-01-20 DIAGNOSIS — D124 Benign neoplasm of descending colon: Secondary | ICD-10-CM | POA: Diagnosis not present

## 2019-01-20 DIAGNOSIS — K635 Polyp of colon: Secondary | ICD-10-CM | POA: Diagnosis not present

## 2019-01-20 DIAGNOSIS — K52831 Collagenous colitis: Secondary | ICD-10-CM | POA: Diagnosis not present

## 2019-01-20 DIAGNOSIS — K640 First degree hemorrhoids: Secondary | ICD-10-CM | POA: Diagnosis not present

## 2019-01-20 DIAGNOSIS — D125 Benign neoplasm of sigmoid colon: Secondary | ICD-10-CM | POA: Diagnosis not present

## 2019-01-20 DIAGNOSIS — R197 Diarrhea, unspecified: Secondary | ICD-10-CM | POA: Diagnosis not present

## 2019-01-22 DIAGNOSIS — D125 Benign neoplasm of sigmoid colon: Secondary | ICD-10-CM | POA: Diagnosis not present

## 2019-01-22 DIAGNOSIS — K52831 Collagenous colitis: Secondary | ICD-10-CM | POA: Diagnosis not present

## 2019-01-22 DIAGNOSIS — D124 Benign neoplasm of descending colon: Secondary | ICD-10-CM | POA: Diagnosis not present

## 2019-01-22 DIAGNOSIS — K635 Polyp of colon: Secondary | ICD-10-CM | POA: Diagnosis not present

## 2019-02-03 DIAGNOSIS — C069 Malignant neoplasm of mouth, unspecified: Secondary | ICD-10-CM | POA: Diagnosis not present

## 2019-02-10 ENCOUNTER — Ambulatory Visit: Payer: PPO | Admitting: Adult Health

## 2019-02-10 ENCOUNTER — Encounter: Payer: Self-pay | Admitting: Adult Health

## 2019-02-10 ENCOUNTER — Other Ambulatory Visit: Payer: Self-pay

## 2019-02-10 DIAGNOSIS — J432 Centrilobular emphysema: Secondary | ICD-10-CM

## 2019-02-10 DIAGNOSIS — Z720 Tobacco use: Secondary | ICD-10-CM | POA: Diagnosis not present

## 2019-02-10 DIAGNOSIS — R918 Other nonspecific abnormal finding of lung field: Secondary | ICD-10-CM

## 2019-02-10 DIAGNOSIS — Z23 Encounter for immunization: Secondary | ICD-10-CM | POA: Diagnosis not present

## 2019-02-10 NOTE — Patient Instructions (Addendum)
Continue on Ventolin inhaler 2 puffs every 6 hours as needed Work on not smoking .  Activity as tolerated.  Mucinex DM Twice daily  As needed  Cough/congestion  Follow up with Dr. Elsworth Soho  In 1 year and As needed   Flu shot today .

## 2019-02-10 NOTE — Assessment & Plan Note (Signed)
COPD/EMphysema- mild -no significant symptoms .  Smoking cessation   Plan  Patient Instructions  Continue on Ventolin inhaler 2 puffs every 6 hours as needed Work on not smoking .  Activity as tolerated.  Mucinex DM Twice daily  As needed  Cough/congestion  Follow up with Dr. Elsworth Soho  In 1 year and As needed   Flu shot today .   '

## 2019-02-10 NOTE — Assessment & Plan Note (Signed)
Scattered pulmonary nodules seen on LDCT - RADS 2 S  Plans for LDCT on 02/11/19 .  Cont w/ protocol .

## 2019-02-10 NOTE — Progress Notes (Signed)
@Patient  ID: Casey Kirk, female    DOB: 03/05/1946, 73 y.o.   MRN: [id number]  Chief Complaint  Patient presents with   Follow-up    COPD     Referring provider: Lawerance Cruel, MD  HPI: 73 year old female active smoker followed for COPD with emphysema Medical history significant for coronary artery disease, bipolar disorder Colitis on Budesonide tablets (on short term)   TEST/EVENTS :  Spirometry 02/2016- normal ratio 72 and normal lung function FEV1 of 98%  CT chest screen 06/2016 RADS 3-Patchy ground-glass in the lower lobes, mild emphysema, 8mm LLL nodule 12/2017 LDCT >> RADS 2S  02/10/2019 Follow up : COPD , pulmonary nodules Patient returns for a one-year follow-up.  She has underlying mild COPD with emphysema (no airflow obstruction on this spirometry 2017).  She is not on any maintenance inhalers.  Says overall breathing is doing okay.  She denies any flare of cough wheezing or shortness of breath.  Patient does continue to smoke.  Smoking cessation was discussed. Quit smoking literature given . Rare use of ventolin . Says she active , shopping, cleaning. Says she goes up and down stairs without issue, walks dog.   Low-dose CT screening August 2019 showed multiple small pulmonary nodules throughout lungs bilaterally, largest right middle lobe nodule measuring 3.9 mm.  Mild emphysema Has low-dose CT screening planned for tomorrow.   Allergies  Allergen Reactions   Hydrocodone-Acetaminophen Itching and Swelling   Latex Itching and Swelling   Pneumococcal Polysaccharide Vaccine Swelling    Fever    Immunization History  Administered Date(s) Administered   Fluad Quad(high Dose 65+) 02/10/2019   Influenza Split 03/08/2016   Influenza Whole 02/20/2008   Influenza, High Dose Seasonal PF 01/31/2017   Influenza,inj,Quad PF,6+ Mos 02/07/2018    Past Medical History:  Diagnosis Date   Allergic rhinitis    Aortic atherosclerosis ([place]) 08/17/2016    Arthritis    ARTHRITIS, HX OF 01/09/2007   BIPOLAR II DISORDER 03/17/2008   Carcinoma of vaginal vault ([place])    COPD 12/21/2006   COPD (chronic obstructive pulmonary disease) ([place]) 12/21/2006   Emphysema on CT 06/2016 02/2016 FEV1 of 98%     Coronary artery calcification 08/17/2016   Coronary artery disease involving native coronary artery of native heart with unstable angina pectoris ([place])    Dysphasia 05/10/2011   Family history of early CAD 08/17/2016   GERD (gastroesophageal reflux disease)    GOUT 01/09/2007   Hx of colonic polyps    Hypothyroidism 10/17/2010   IBS (irritable bowel syndrome)    Osteoporosis    Sinusitis    Thyroid disease    TINEA CORPORIS 10/06/2009   Tobacco abuse 05/10/2011    Tobacco History: Social History   Tobacco Use  Smoking Status Current Every Day Smoker   Packs/day: 0.50   Years: 40.00   Pack years: 20.00   Types: Cigarettes  Smokeless Tobacco Never Used  Tobacco Comment   Pt states that she is currently smoking about 5cigarettes a day now.   Ready to quit: No Counseling given: Yes Comment: Pt states that she is currently smoking about 5cigarettes a day now.   Outpatient Medications Prior to Visit  Medication Sig Dispense Refill   acetaminophen (TYLENOL) 500 MG tablet Take 500 mg by mouth every 8 (eight) hours as needed (pain).     albuterol (PROVENTIL HFA;VENTOLIN HFA) 108 (90 Base) MCG/ACT inhaler Inhale 2 puffs into the lungs every 6 (six) hours as needed for wheezing or  shortness of breath. 1 Inhaler 5   aspirin EC 81 MG tablet Take 81 mg by mouth daily.     budesonide (ENTOCORT EC) 3 MG 24 hr capsule Take 3 mg by mouth 3 (three) times daily.     CALCIUM/MAGNESIUM/ZINC FORMULA PO Take 1 tablet by mouth 3 (three) times a week.     cholecalciferol (VITAMIN D) 1000 units tablet Take 1,000 Units by mouth daily.     diphenhydrAMINE (BENADRYL) 25 mg capsule Take 25 mg by mouth daily as needed for allergies.      diphenoxylate-atropine (LOMOTIL) 2.5-0.025 MG tablet TAKE 2 TABLETS BY MOUTH AS NEEDED 4 TIMES A DAY     fish oil-omega-3 fatty acids 1000 MG capsule Take 1 g by mouth 2 (two) times a week. qod     fluticasone (FLONASE) 50 MCG/ACT nasal spray Place 2 sprays into both nostrils daily as needed for allergies or rhinitis.     lamoTRIgine (LAMICTAL) 25 MG tablet Take 1 tablet (25 mg total) by mouth daily as needed (stress). 90 tablet 0   Melatonin 3 MG TABS Take 3 mg by mouth at bedtime.     Multiple Vitamin (MULTIVITAMIN) tablet Take 1 tablet by mouth daily.       nicotine polacrilex (NICORETTE) 4 MG gum Take 4 mg by mouth as needed for smoking cessation.     rosuvastatin (CRESTOR) 20 MG tablet Take 20 mg by mouth daily.      traZODone (DESYREL) 50 MG tablet Take 0.5-1 tablets (25-50 mg total) by mouth at bedtime as needed. 90 tablet 1   Turmeric Curcumin 500 MG CAPS Take 1 capsule by mouth every other day.     vitamin C (ASCORBIC ACID) 500 MG tablet Take 500 mg by mouth daily.     vitamin E 100 UNIT capsule Take 100 Units by mouth daily.      cephALEXin (KEFLEX) 500 MG capsule Take 1 capsule (500 mg total) by mouth 3 (three) times daily. (Patient not taking: Reported on 02/10/2019) 30 capsule 1   CHANTIX STARTING MONTH PAK 0.5 MG X 11 & 1 MG X 42 tablet See admin instructions.     No facility-administered medications prior to visit.      Review of Systems:   Constitutional:   No  weight loss, night sweats,  Fevers, chills, fatigue, or  lassitude.  HEENT:   No headaches,  Difficulty swallowing,  Tooth/dental problems, or  Sore throat,                No sneezing, itching, ear ache, nasal congestion, post nasal drip,   CV:  No chest pain,  Orthopnea, PND, swelling in lower extremities, anasarca, dizziness, palpitations, syncope.   GI  No heartburn, indigestion, abdominal pain, nausea, vomiting, diarrhea, change in bowel habits, loss of appetite, bloody stools.   Resp:  No excess  mucus, no productive cough,  No non-productive cough,  No coughing up of blood.  No change in color of mucus.  No wheezing.  No chest wall deformity  Skin: no rash or lesions.  GU: no dysuria, change in color of urine, no urgency or frequency.  No flank pain, no hematuria   MS:  No joint pain or swelling.  No decreased range of motion.  No back pain.    Physical Exam  BP 118/70 (BP Location: Right Leg, Cuff Size: Normal)    Pulse (!) 58    Ht 5\' 3"  (1.6 m)    Wt 133 lb 9.6  oz (60.6 kg)    SpO2 100%    BMI 23.67 kg/m   GEN: A/Ox3; pleasant , NAD, well nourished    HEENT:  [address]/AT,  EACs-clear, TMs-wnl, NOSE-clear, THROAT-clear, no lesions, no postnasal drip or exudate noted.   NECK:  Supple w/ fair ROM; no JVD; normal carotid impulses w/o bruits; no thyromegaly or nodules palpated; no lymphadenopathy.    RESP  Clear  P & A; w/o, wheezes/ rales/ or rhonchi. no accessory muscle use, no dullness to percussion  CARD:  RRR, no m/r/g, no peripheral edema, pulses intact, no cyanosis or clubbing.  GI:   Soft & nt; nml bowel sounds; no organomegaly or masses detected.   Musco: Warm bil, no deformities or joint swelling noted.   Neuro: alert, no focal deficits noted.    Skin: Warm, no lesions or rashes    Lab Results:  CBC  No results found for: BNP  ProBNP No results found for: PROBNP  Imaging: No results found.    No flowsheet data found.  No results found for: NITRICOXIDE      Assessment & Plan:   COPD (chronic obstructive pulmonary disease) (HCC) COPD/EMphysema- mild -no significant symptoms .  Smoking cessation   Plan  Patient Instructions  Continue on Ventolin inhaler 2 puffs every 6 hours as needed Work on not smoking .  Activity as tolerated.  Mucinex DM Twice daily  As needed  Cough/congestion  Follow up with Dr. Elsworth Soho  In 1 year and As needed   Flu shot today .   '    Tobacco abuse Smoking cessation       Rexene Edison, NP 02/10/2019

## 2019-02-10 NOTE — Assessment & Plan Note (Signed)
Smoking cessation  

## 2019-02-11 ENCOUNTER — Ambulatory Visit (INDEPENDENT_AMBULATORY_CARE_PROVIDER_SITE_OTHER)
Admission: RE | Admit: 2019-02-11 | Discharge: 2019-02-11 | Disposition: A | Discharge status: Home / Self Care | Payer: PPO | Source: Ambulatory Visit | Attending: Acute Care | Admitting: Acute Care

## 2019-02-11 DIAGNOSIS — F17210 Nicotine dependence, cigarettes, uncomplicated: Secondary | ICD-10-CM

## 2019-02-11 DIAGNOSIS — Z122 Encounter for screening for malignant neoplasm of respiratory organs: Secondary | ICD-10-CM

## 2019-02-13 ENCOUNTER — Telehealth: Payer: Self-pay | Admitting: Acute Care

## 2019-02-13 DIAGNOSIS — F17210 Nicotine dependence, cigarettes, uncomplicated: Secondary | ICD-10-CM

## 2019-02-13 DIAGNOSIS — Z87891 Personal history of nicotine dependence: Secondary | ICD-10-CM

## 2019-02-13 DIAGNOSIS — Z122 Encounter for screening for malignant neoplasm of respiratory organs: Secondary | ICD-10-CM

## 2019-02-13 NOTE — Telephone Encounter (Signed)
Pt informed of CT results per Sarah Groce, NP.  PT verbalized understanding.  Copy sent to PCP.  Order placed for 1 yr f/u CT.  

## 2019-02-14 DIAGNOSIS — E7800 Pure hypercholesterolemia, unspecified: Secondary | ICD-10-CM | POA: Diagnosis not present

## 2019-02-14 DIAGNOSIS — M810 Age-related osteoporosis without current pathological fracture: Secondary | ICD-10-CM | POA: Diagnosis not present

## 2019-02-14 DIAGNOSIS — F3181 Bipolar II disorder: Secondary | ICD-10-CM | POA: Diagnosis not present

## 2019-02-14 DIAGNOSIS — J449 Chronic obstructive pulmonary disease, unspecified: Secondary | ICD-10-CM | POA: Diagnosis not present

## 2019-02-14 DIAGNOSIS — I2510 Atherosclerotic heart disease of native coronary artery without angina pectoris: Secondary | ICD-10-CM | POA: Diagnosis not present

## 2019-04-25 DIAGNOSIS — Z0000 Encounter for general adult medical examination without abnormal findings: Secondary | ICD-10-CM | POA: Diagnosis not present

## 2019-04-29 DIAGNOSIS — J449 Chronic obstructive pulmonary disease, unspecified: Secondary | ICD-10-CM | POA: Diagnosis not present

## 2019-04-29 DIAGNOSIS — R591 Generalized enlarged lymph nodes: Secondary | ICD-10-CM | POA: Diagnosis not present

## 2019-04-29 DIAGNOSIS — E7800 Pure hypercholesterolemia, unspecified: Secondary | ICD-10-CM | POA: Diagnosis not present

## 2019-04-29 DIAGNOSIS — Z79899 Other long term (current) drug therapy: Secondary | ICD-10-CM | POA: Diagnosis not present

## 2019-04-29 DIAGNOSIS — R39198 Other difficulties with micturition: Secondary | ICD-10-CM | POA: Diagnosis not present

## 2019-04-29 DIAGNOSIS — I2510 Atherosclerotic heart disease of native coronary artery without angina pectoris: Secondary | ICD-10-CM | POA: Diagnosis not present

## 2019-04-29 DIAGNOSIS — E559 Vitamin D deficiency, unspecified: Secondary | ICD-10-CM | POA: Diagnosis not present

## 2019-05-08 DIAGNOSIS — Z79899 Other long term (current) drug therapy: Secondary | ICD-10-CM | POA: Diagnosis not present

## 2019-05-08 DIAGNOSIS — R39198 Other difficulties with micturition: Secondary | ICD-10-CM | POA: Diagnosis not present

## 2019-05-08 DIAGNOSIS — E7800 Pure hypercholesterolemia, unspecified: Secondary | ICD-10-CM | POA: Diagnosis not present

## 2019-05-08 DIAGNOSIS — E559 Vitamin D deficiency, unspecified: Secondary | ICD-10-CM | POA: Diagnosis not present

## 2019-05-08 DIAGNOSIS — J449 Chronic obstructive pulmonary disease, unspecified: Secondary | ICD-10-CM | POA: Diagnosis not present

## 2019-05-08 DIAGNOSIS — R591 Generalized enlarged lymph nodes: Secondary | ICD-10-CM | POA: Diagnosis not present

## 2019-05-19 DIAGNOSIS — C069 Malignant neoplasm of mouth, unspecified: Secondary | ICD-10-CM | POA: Diagnosis not present

## 2019-05-19 DIAGNOSIS — R221 Localized swelling, mass and lump, neck: Secondary | ICD-10-CM | POA: Diagnosis not present

## 2019-05-20 ENCOUNTER — Other Ambulatory Visit: Payer: Self-pay | Admitting: Otolaryngology

## 2019-05-20 DIAGNOSIS — R221 Localized swelling, mass and lump, neck: Secondary | ICD-10-CM

## 2019-05-28 ENCOUNTER — Other Ambulatory Visit: Payer: Self-pay

## 2019-05-28 ENCOUNTER — Ambulatory Visit
Admission: RE | Admit: 2019-05-28 | Discharge: 2019-05-28 | Disposition: A | Discharge status: Home / Self Care | Payer: PPO | Source: Ambulatory Visit | Attending: Otolaryngology | Admitting: Otolaryngology

## 2019-05-28 DIAGNOSIS — R221 Localized swelling, mass and lump, neck: Secondary | ICD-10-CM

## 2019-05-28 MED ORDER — IOPAMIDOL (ISOVUE-300) INJECTION 61%
75.0000 mL | Freq: Once | INTRAVENOUS | Status: AC | PRN
  Administered 2019-05-28: 14:00:00 75 mL via INTRAVENOUS

## 2019-06-09 ENCOUNTER — Other Ambulatory Visit: Payer: PPO

## 2019-06-10 ENCOUNTER — Ambulatory Visit: Payer: PPO | Attending: Internal Medicine

## 2019-06-10 DIAGNOSIS — Z23 Encounter for immunization: Secondary | ICD-10-CM | POA: Insufficient documentation

## 2019-06-10 NOTE — Progress Notes (Signed)
   Covid-19 Vaccination Clinic  Name:  Casey Kirk    MRN: [id number] DOB: 10-Oct-1945  06/10/2019  Casey Kirk was observed post Covid-19 immunization for 30 minutes based on pre-vaccination screening without incidence. She was provided with Vaccine Information Sheet and instruction to access the V-Safe system.   Casey Kirk was instructed to call 911 with any severe reactions post vaccine: Marland Kitchen Difficulty breathing  . Swelling of your face and throat  . A fast heartbeat  . A bad rash all over your body  . Dizziness and weakness    Immunizations Administered    Name Date Dose VIS Date Route   Pfizer COVID-19 Vaccine 06/10/2019  1:06 PM 0.3 mL 05/02/2019 Intramuscular   Manufacturer: [place]   Lot: [id number]   [place]: [id number]

## 2019-06-16 DIAGNOSIS — C069 Malignant neoplasm of mouth, unspecified: Secondary | ICD-10-CM | POA: Diagnosis not present

## 2019-06-16 DIAGNOSIS — H9193 Unspecified hearing loss, bilateral: Secondary | ICD-10-CM | POA: Diagnosis not present

## 2019-06-16 DIAGNOSIS — R221 Localized swelling, mass and lump, neck: Secondary | ICD-10-CM | POA: Diagnosis not present

## 2019-06-17 DIAGNOSIS — H524 Presbyopia: Secondary | ICD-10-CM | POA: Diagnosis not present

## 2019-06-17 DIAGNOSIS — H2512 Age-related nuclear cataract, left eye: Secondary | ICD-10-CM | POA: Diagnosis not present

## 2019-06-17 DIAGNOSIS — H26491 Other secondary cataract, right eye: Secondary | ICD-10-CM | POA: Diagnosis not present

## 2019-06-17 DIAGNOSIS — H43813 Vitreous degeneration, bilateral: Secondary | ICD-10-CM | POA: Diagnosis not present

## 2019-07-01 ENCOUNTER — Ambulatory Visit: Payer: PPO | Attending: Internal Medicine

## 2019-07-01 DIAGNOSIS — Z23 Encounter for immunization: Secondary | ICD-10-CM

## 2019-07-01 NOTE — Progress Notes (Signed)
   Covid-19 Vaccination Clinic  Name:  Casey Kirk    MRN: [id number] DOB: 08/16/1945  07/01/2019  [name] was observed post Covid-19 immunization for 15 minutes without incidence. She was provided with Vaccine Information Sheet and instruction to access the V-Safe system.   [name] was instructed to call 911 with any severe reactions post vaccine: Marland Kitchen Difficulty breathing  . Swelling of your face and throat  . A fast heartbeat  . A bad rash all over your body  . Dizziness and weakness    Immunizations Administered    Name Date Dose VIS Date Route   Pfizer COVID-19 Vaccine 07/01/2019  1:38 PM 0.3 mL 05/02/2019 Intramuscular   Manufacturer: [place]   Lot: [id number]   [place]: [id number]

## 2019-07-08 ENCOUNTER — Ambulatory Visit (INDEPENDENT_AMBULATORY_CARE_PROVIDER_SITE_OTHER): Payer: PPO | Admitting: Physician Assistant

## 2019-07-08 ENCOUNTER — Encounter: Payer: Self-pay | Admitting: Physician Assistant

## 2019-07-08 ENCOUNTER — Other Ambulatory Visit: Payer: Self-pay

## 2019-07-08 DIAGNOSIS — F5105 Insomnia due to other mental disorder: Secondary | ICD-10-CM | POA: Diagnosis not present

## 2019-07-08 DIAGNOSIS — F17200 Nicotine dependence, unspecified, uncomplicated: Secondary | ICD-10-CM

## 2019-07-08 DIAGNOSIS — F4329 Adjustment disorder with other symptoms: Secondary | ICD-10-CM | POA: Diagnosis not present

## 2019-07-08 DIAGNOSIS — F99 Mental disorder, not otherwise specified: Secondary | ICD-10-CM | POA: Diagnosis not present

## 2019-07-08 MED ORDER — TRAZODONE HCL 50 MG PO TABS: 25.0000 mg | ORAL_TABLET | Freq: Every evening | ORAL | 1 refills | Status: DC | PRN

## 2019-07-08 NOTE — Progress Notes (Signed)
[facility]  Patient ID: Casey Kirk,  MRN: [id number]  PCP: Casey Cruel, MD  Date of Evaluation: 07/08/2019 Time spent:20 minutes  Chief Complaint:  Chief Complaint    Insomnia; Stress      HISTORY/CURRENT STATUS: HPI for 86-month med check.  Patient presents for refills of her medication.  She has been doing very well on her current regimen.  She rarely takes the Lamictal and she is only using it as needed for stress.  Casey Kirk, [place], her previous provider prescribed it that way and it has worked for years.  Now she is not under a lot of stress so does not need it often.  We again discussed her diagnosis of bipolar 2 disorder.  After going over her past medical history, I truly think that the symptoms she had between 2008 and 2009 were due to lack of sleep and stress.  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  She would only get 3 or 4 hours of sleep every night, which went on for 1 to 2 years.  She "had a breakdown" where she had an episode of psychosis, anxiety, and depressive symptoms.  She was diagnosed at that time with bipolar disorder.  She had never had any symptoms prior to that and none since.  [facility].  She was never given any meds other than the Lamictal and trazodone.  No antipsychotics were ever prescribed.  She is able to enjoy things, energy and motivation are good, memory is good, she is able to focus on things and complete tasks without distractions for the most part, she denies suicidal or homicidal thoughts.  Has trouble sleeping unless she takes the trazodone and melatonin.  She takes both every night and sleeps well.  She feels rested when she gets up.  Denies increased energy with decreased need for sleep.  No impulsivity, risky behavior, grandiosity, increased spending, or increased libido.  No paranoia.  No hallucinations.  Denies dizziness, syncope, seizures,  numbness, tingling, tremor, tics, unsteady gait, slurred speech, confusion. Denies muscle or joint pain, stiffness, or dystonia.  Individual Medical History/ Review of Systems: Changes? :Yes  had a colonoscopy w/ benign polypectomy since last office visit  Past medications for mental health diagnoses include: Lamictal, Trazodone  Allergies: Hydrocodone-acetaminophen, Latex, and Pneumococcal polysaccharide vaccine  Current Medications:  Current Outpatient Medications:  .  acetaminophen (TYLENOL) 500 MG tablet, Take 500 mg by mouth every 8 (eight) hours as needed (pain)., Disp: , Rfl:  .  albuterol (PROVENTIL HFA;VENTOLIN HFA) 108 (90 Base) MCG/ACT inhaler, Inhale 2 puffs into the lungs every 6 (six) hours as needed for wheezing or shortness of breath., Disp: 1 Inhaler, Rfl: 5 .  aspirin EC 81 MG tablet, Take 81 mg by mouth daily., Disp: , Rfl:  .  CALCIUM/MAGNESIUM/ZINC FORMULA PO, Take 1 tablet by mouth 3 (three) times a week., Disp: , Rfl:  .  cholecalciferol (VITAMIN D) 1000 units tablet, Take 1,000 Units by mouth daily., Disp: , Rfl:  .  diphenhydrAMINE (BENADRYL) 25 mg capsule, Take 25 mg by mouth daily as needed for allergies., Disp: , Rfl:  .  diphenoxylate-atropine (LOMOTIL) 2.5-0.025 MG tablet, TAKE 2 TABLETS BY MOUTH AS NEEDED 4 TIMES A DAY, Disp: , Rfl:  .  fish oil-omega-3 fatty acids 1000 MG capsule, Take 1 g by mouth 2 (two) times a week. qod, Disp: , Rfl:  .  fluticasone (  FLONASE) 50 MCG/ACT nasal spray, Place 2 sprays into both nostrils daily as needed for allergies or rhinitis., Disp: , Rfl:  .  lamoTRIgine (LAMICTAL) 25 MG tablet, Take 1 tablet (25 mg total) by mouth daily as needed (stress)., Disp: 90 tablet, Rfl: 0 .  Melatonin 3 MG TABS, Take 3 mg by mouth at bedtime., Disp: , Rfl:  .  Multiple Vitamin (MULTIVITAMIN) tablet, Take 1 tablet by mouth daily.  , Disp: , Rfl:  .  nicotine polacrilex (NICORETTE) 4 MG gum, Take 4 mg by mouth as needed for smoking cessation., Disp:  , Rfl:  .  rosuvastatin (CRESTOR) 20 MG tablet, Take 20 mg by mouth daily. , Disp: , Rfl:  .  traZODone (DESYREL) 50 MG tablet, Take 0.5-1 tablets (25-50 mg total) by mouth at bedtime as needed., Disp: 90 tablet, Rfl: 1 .  Turmeric Curcumin 500 MG CAPS, Take 1 capsule by mouth every other day., Disp: , Rfl:  .  vitamin C (ASCORBIC ACID) 500 MG tablet, Take 500 mg by mouth daily., Disp: , Rfl:  .  vitamin E 100 UNIT capsule, Take 100 Units by mouth daily. , Disp: , Rfl:  .  budesonide (ENTOCORT EC) 3 MG 24 hr capsule, Take 3 mg by mouth 3 (three) times daily., Disp: , Rfl:  Medication Side Effects: none  Family Medical/ Social History: Changes? No  MENTAL HEALTH EXAM:  There were no vitals taken for this visit.There is no height or weight on file to calculate BMI.  General Appearance: Casual, Neat and Well Groomed  Eye Contact:  Good  Speech:  Normal Rate and Choppy  Volume:  Normal  Mood:  Euthymic  Affect:  Appropriate  Thought Process:  Goal Directed and Descriptions of Associations: Intact  Orientation:  Full (Time, Place, and Person)  Thought Content: Logical   Suicidal Thoughts:  No  Homicidal Thoughts:  No  Memory:  WNL  Judgement:  Good  Insight:  Good  Psychomotor Activity:  Normal  Concentration:  Concentration: Good  Recall:  Good  Fund of Knowledge: Good  Language: Good  Assets:  Desire for Improvement  ADL's:  Intact  Cognition: WNL  Prognosis:  Good    DIAGNOSES:    ICD-10-CM   1. Stress and adjustment reaction  F43.29   2. Insomnia due to other mental disorder  F51.05    F99   3. Smoker  F17.200     Receiving Psychotherapy: No    RECOMMENDATIONS:  I spent 20 minutes with her. PDMP was reviewed. We again discussed her diagnosis.  I do not think she ever had bipolar disorder.  I think she was extremely sleep deprived as well as being stressed because of her mother's illness and the emotional toll that takes.  I have removed that diagnosis from her past  medical history. I am glad to see her continuing to cut down on smoking! Continue trazodone 50 mg, 1/2-1 p.o. nightly as needed. Continue Lamictal 25 mg 1 daily as needed.  This may be only placebo but at any rate she does not take it often, has had no rash, and even if it is placebo, she feels well with it so we will continue as is. Continue melatonin 3 mg, 1/2-1 nightly as needed. Continue Nicorette gum as needed. Return in 6 months.  Donnal Moat, PA-C

## 2019-07-17 DIAGNOSIS — H25812 Combined forms of age-related cataract, left eye: Secondary | ICD-10-CM | POA: Diagnosis not present

## 2019-07-17 DIAGNOSIS — H2512 Age-related nuclear cataract, left eye: Secondary | ICD-10-CM | POA: Diagnosis not present

## 2019-07-21 DIAGNOSIS — L309 Dermatitis, unspecified: Secondary | ICD-10-CM | POA: Diagnosis not present

## 2019-08-13 ENCOUNTER — Ambulatory Visit: Payer: PPO | Admitting: Cardiology

## 2019-08-13 ENCOUNTER — Encounter: Payer: Self-pay | Admitting: Cardiology

## 2019-08-13 ENCOUNTER — Other Ambulatory Visit: Payer: Self-pay

## 2019-08-13 VITALS — BP 130/80 | HR 66 | Ht 63.0 in | Wt 136.0 lb

## 2019-08-13 DIAGNOSIS — E7800 Pure hypercholesterolemia, unspecified: Secondary | ICD-10-CM

## 2019-08-13 DIAGNOSIS — I700 Atherosclerosis of aorta: Secondary | ICD-10-CM

## 2019-08-13 DIAGNOSIS — Z955 Presence of coronary angioplasty implant and graft: Secondary | ICD-10-CM

## 2019-08-13 DIAGNOSIS — Z720 Tobacco use: Secondary | ICD-10-CM

## 2019-08-13 DIAGNOSIS — I2510 Atherosclerotic heart disease of native coronary artery without angina pectoris: Secondary | ICD-10-CM

## 2019-08-13 NOTE — Patient Instructions (Signed)
Medication Instructions:  The current medical regimen is effective;  continue present plan and medications.  *If you need a refill on your cardiac medications before your next appointment, please call your pharmacy*  Follow-Up: At [facility], you and your health needs are our priority.  As part of our continuing mission to provide you with exceptional heart care, we have created designated Provider Care Teams.  These Care Teams include your primary Cardiologist (physician) and Advanced Practice Providers (APPs -  Physician Assistants and Nurse Practitioners) who all work together to provide you with the care you need, when you need it.  We recommend signing up for the patient portal called "MyChart".  Sign up information is provided on this After Visit Summary.  MyChart is used to connect with patients for Virtual Visits (Telemedicine).  Patients are able to view lab/test results, encounter notes, upcoming appointments, etc.  Non-urgent messages can be sent to your provider as well.   To learn more about what you can do with MyChart, go to [email or web address].    Your next appointment:   12 month(s)  The format for your next appointment:   In Person  Provider:   Mark Skains, MD   Thank you for choosing [facility]!!      

## 2019-08-13 NOTE — Progress Notes (Signed)
Cardiology Office Note:    Date:  08/13/2019   ID:  Casey Kirk, DOB 29-Aug-1945, MRN [id number]  PCP:  Lawerance Cruel, MD  Cardiologist:  Candee Furbish, MD  Electrophysiologist:  None   Referring MD: Lawerance Cruel, MD     History of Present Illness:    Casey Kirk is a 74 y.o. female here for the follow-up of coronary artery disease catheterization 2018 showing 80% mid LAD stenosis with FFR 0.7 with drug-eluting stent x1 performed.  Same-day discharge.  Also has comorbidities of COPD bipolar disorder GERD IBS hyperlipidemia thyroid disease.  Prior ABIs were normal in 2018 Carotid Doppler showed mild plaque left and right  Overall been doing quite well.  Previously had asked about redness in her palms of hands and cracking, mention red palms again.  No fevers no other rheumatologic illness.  Taking Crestor.  No myalgias.  No fevers chills nausea vomiting syncope bleeding.  No chest pain no shortness of breath.  Past Medical History:  Diagnosis Date  . Allergic rhinitis   . Aortic atherosclerosis ([place]) 08/17/2016  . Arthritis   . ARTHRITIS, HX OF 01/09/2007  . BIPOLAR II DISORDER 03/17/2008  . 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 COPD 12/21/2006  . COPD (chronic obstructive pulmonary disease) ([place]) 12/21/2006   Emphysema on CT 06/2016 02/2016 FEV1 of 98%    . Coronary artery calcification 08/17/2016  . Coronary artery disease involving native coronary artery of native heart with unstable angina pectoris ([place])   . Dysphasia 05/10/2011  . Family history of early CAD 08/17/2016  . GERD (gastroesophageal reflux disease)   . GOUT 01/09/2007  . Hx of colonic polyps   . Hypothyroidism 10/17/2010  . IBS (irritable bowel syndrome)   . Osteoporosis   . Sinusitis   . Thyroid disease   . TINEA CORPORIS 10/06/2009  . Tobacco abuse 05/10/2011    Past Surgical History:  Procedure Laterality Date  . ABDOMINAL HYSTERECTOMY     CA cells removed end of vagina  . BREAST SURGERY      biopsy  . COLONOSCOPY    . CORONARY STENT INTERVENTION N/A 03/16/2017   Procedure: CORONARY STENT INTERVENTION;  Surgeon: Burnell Blanks, MD;  Location: [facility];  Service: Cardiovascular;  Laterality: N/A;  . EYE SURGERY Right    cataract  . INTRAVASCULAR PRESSURE WIRE/FFR STUDY N/A 03/16/2017   Procedure: INTRAVASCULAR PRESSURE WIRE/FFR STUDY;  Surgeon: Burnell Blanks, MD;  Location: [facility];  Service: Cardiovascular;  Laterality: N/A;  . LEFT HEART CATH AND CORONARY ANGIOGRAPHY N/A 03/16/2017   Procedure: LEFT HEART CATH AND CORONARY ANGIOGRAPHY;  Surgeon: Burnell Blanks, MD;  Location: [facility];  Service: Cardiovascular;  Laterality: N/A;  . LEFT HEART CATH AND CORONARY ANGIOGRAPHY N/A 01/30/2018   Procedure: LEFT HEART CATH AND CORONARY ANGIOGRAPHY;  Surgeon: Burnell Blanks, MD;  Location: [facility];  Service: Cardiovascular;  Laterality: N/A;  . MASS EXCISION Right 05/10/2018   Procedure: EXCISION MASS OF RIGHT PALATAL  WITH LOCAL SOFT TISSUE RECONSTRUCTION;  Surgeon: Jerrell Belfast, MD;  Location: [facility];  Service: ENT;  Laterality: Right;  . TOOTH EXTRACTION    . vaginal carcinoma surgery      Current Medications: Current Meds  Medication Sig  . acetaminophen (TYLENOL) 500 MG tablet Take 500 mg by mouth every 8 (eight) hours as needed (pain).  Marland Kitchen albuterol (PROVENTIL HFA;VENTOLIN HFA) 108 (90 Base) MCG/ACT inhaler Inhale  2 puffs into the lungs every 6 (six) hours as needed for wheezing or shortness of breath.  Marland Kitchen aspirin EC 81 MG tablet Take 81 mg by mouth daily.  Marland Kitchen CALCIUM/MAGNESIUM/ZINC FORMULA PO Take 1 tablet by mouth 3 (three) times a week.  . cholecalciferol (VITAMIN D) 1000 units tablet Take 1,000 Units by mouth daily.  . diphenhydrAMINE (BENADRYL) 25 mg capsule Take 25 mg by mouth daily as needed for allergies.  . diphenoxylate-atropine (LOMOTIL) 2.5-0.025 MG tablet TAKE 2 TABLETS BY MOUTH AS NEEDED  4 TIMES A DAY  . fish oil-omega-3 fatty acids 1000 MG capsule Take 1 g by mouth 2 (two) times a week. qod  . fluticasone (FLONASE) 50 MCG/ACT nasal spray Place 2 sprays into both nostrils daily as needed for allergies or rhinitis.  Marland Kitchen lamoTRIgine (LAMICTAL) 25 MG tablet Take 1 tablet (25 mg total) by mouth daily as needed (stress).  . Melatonin 3 MG TABS Take 3 mg by mouth at bedtime.  . Multiple Vitamin (MULTIVITAMIN) tablet Take 1 tablet by mouth daily.    . nicotine polacrilex (NICORETTE) 4 MG gum Take 4 mg by mouth as needed for smoking cessation.  . rosuvastatin (CRESTOR) 20 MG tablet Take 20 mg by mouth daily.   . traZODone (DESYREL) 50 MG tablet Take 0.5-1 tablets (25-50 mg total) by mouth at bedtime as needed.  . Turmeric Curcumin 500 MG CAPS Take 1 capsule by mouth every other day.  . vitamin C (ASCORBIC ACID) 500 MG tablet Take 500 mg by mouth daily.  . vitamin E 100 UNIT capsule Take 100 Units by mouth daily.      Allergies:   Hydrocodone-acetaminophen, Latex, and Pneumococcal polysaccharide vaccine   Social History   Socioeconomic History  . Marital status: Married    Spouse name: Not on file  . Number of children: 2  . Years of education: Not on file  . Highest education level: Not on file  Occupational History  . Occupation: retired  Tobacco Use  . Smoking status: Current Every Day Smoker    Packs/day: 0.25    Years: 40.00    Pack years: 10.00    Types: Cigarettes  . Smokeless tobacco: Never Used  Substance and Sexual Activity  . Alcohol use: Not Currently    Comment: Rarely   . Drug use: No  . Sexual activity: Yes  Other Topics Concern  . Not on file  Social History Narrative   Lives alone.  She has two grown children.   She is retired from working at [employer].   Highest level of education:  2 year business college   Social Determinants of Health   Financial Resource Strain:   . Difficulty of Paying Living Expenses:   Food Insecurity:   . Worried About  Charity fundraiser in the Last Year:   . Arboriculturist in the Last Year:   Transportation Needs:   . Film/video editor (Medical):   Marland Kitchen Lack of Transportation (Non-Medical):   Physical Activity:   . Days of Exercise per Week:   . Minutes of Exercise per Session:   Stress:   . Feeling of Stress :   Social Connections:   . Frequency of Communication with Friends and Family:   . Frequency of Social Gatherings with Friends and Family:   . Attends Religious Services:   . Active Member of Clubs or Organizations:   . Attends Archivist Meetings:   Marland Kitchen Marital Status:  Family History: The patient's family history includes Alcohol abuse in an other family member; Arthritis in an other family member; COPD in her mother; Cancer in an other family member; Colon polyps in her brother and mother; Diabetes in an other family member; Heart attack (age of onset: 104) in her brother; Heart disease in an other family member; Hyperlipidemia in an other family member; Hypertension in an other family member; Sudden death in an other family member; Tremor in her mother. There is no history of Colon cancer.  ROS:   Please see the history of present illness.     All other systems reviewed and are negative.  EKGs/Labs/Other Studies Reviewed:    The following studies were reviewed today: Cardiac catheterization LAD stent  EKG:  EKG is  ordered today.  The ekg ordered today demonstrates sinus rhythm 66 with no other abnormalities.  Recent Labs: No results found for requested labs within last 8760 hours.  Recent Lipid Panel    Component Value Date/Time   CHOL 107 05/10/2017 0832   TRIG 94 05/10/2017 0832   HDL 51 05/10/2017 0832   CHOLHDL 2.1 05/10/2017 0832   CHOLHDL 5 09/22/2010 0843   VLDL 20.6 09/22/2010 0843   LDLCALC 37 05/10/2017 0832    Physical Exam:    VS:  BP 130/80   Pulse 66   Ht 5\' 3"  (1.6 m)   Wt 136 lb (61.7 kg)   SpO2 98%   BMI 24.09 kg/m     Wt Readings  from Last 3 Encounters:  08/13/19 136 lb (61.7 kg)  02/10/19 133 lb 9.6 oz (60.6 kg)  07/23/18 137 lb 12.8 oz (62.5 kg)     GEN:  Well nourished, well developed in no acute distress HEENT: Normal NECK: No JVD; No carotid bruits LYMPHATICS: No lymphadenopathy CARDIAC: RRR, no murmurs, rubs, gallops RESPIRATORY:  Clear to auscultation without rales, wheezing or rhonchi  ABDOMEN: Soft, non-tender, non-distended MUSCULOSKELETAL:  No edema; No deformity  SKIN: Warm and dry NEUROLOGIC:  Alert and oriented x 3 PSYCHIATRIC:  Normal affect   ASSESSMENT:    1. Coronary artery disease involving native coronary artery of native heart without angina pectoris   2. Aortic atherosclerosis (HCC)   3. Status post coronary artery stent placement   4. Tobacco abuse   5. Pure hypercholesterolemia    PLAN:    In order of problems listed above:  Coronary artery disease -LAD stent 03/16/2017.  Aspirin 81.  Bruising on Plavix previously.  No anginal symptoms doing well.  Hyperlipidemia -Crestor-LDL 36.  ALT 14 hemoglobin 15.  Excellent.  At goal  COPD/tobacco use -Encourage tobacco cessation.  She is chewing nicotine gum occasionally.  Leg pain -Prior ABIs were reassuring no evidence of peripheral vascular disease.  Aortic atherosclerosis -Secondary prevention efforts.  Diet.  Exercise.  Aspirin.   Medication Adjustments/Labs and Tests Ordered: Current medicines are reviewed at length with the patient today.  Concerns regarding medicines are outlined above.  Orders Placed This Encounter  Procedures  . EKG 12-Lead   No orders of the defined types were placed in this encounter.   Patient Instructions  Medication Instructions:  The current medical regimen is effective;  continue present plan and medications.  *If you need a refill on your cardiac medications before your next appointment, please call your pharmacy*  Follow-Up: At [facility], you and your health needs are our  priority.  As part of our continuing mission to provide you with exceptional heart care, we  have created designated Provider Care Teams.  These Care Teams include your primary Cardiologist (physician) and Advanced Practice Providers (APPs -  Physician Assistants and Nurse Practitioners) who all work together to provide you with the care you need, when you need it.  We recommend signing up for the patient portal called "MyChart".  Sign up information is provided on this After Visit Summary.  MyChart is used to connect with patients for Virtual Visits (Telemedicine).  Patients are able to view lab/test results, encounter notes, upcoming appointments, etc.  Non-urgent messages can be sent to your provider as well.   To learn more about what you can do with MyChart, go to [email or web address].    Your next appointment:   12 month(s)  The format for your next appointment:   In Person  Provider:   Candee Furbish, MD   Thank you for choosing [facility]!!         Signed, Candee Furbish, MD  08/13/2019 10:49 AM    [place]

## 2019-08-20 DIAGNOSIS — M810 Age-related osteoporosis without current pathological fracture: Secondary | ICD-10-CM | POA: Diagnosis not present

## 2019-08-20 DIAGNOSIS — I2510 Atherosclerotic heart disease of native coronary artery without angina pectoris: Secondary | ICD-10-CM | POA: Diagnosis not present

## 2019-08-20 DIAGNOSIS — F3181 Bipolar II disorder: Secondary | ICD-10-CM | POA: Diagnosis not present

## 2019-08-20 DIAGNOSIS — E7800 Pure hypercholesterolemia, unspecified: Secondary | ICD-10-CM | POA: Diagnosis not present

## 2019-08-20 DIAGNOSIS — J449 Chronic obstructive pulmonary disease, unspecified: Secondary | ICD-10-CM | POA: Diagnosis not present

## 2019-09-03 DIAGNOSIS — K52831 Collagenous colitis: Secondary | ICD-10-CM | POA: Diagnosis not present

## 2019-09-03 DIAGNOSIS — R197 Diarrhea, unspecified: Secondary | ICD-10-CM | POA: Diagnosis not present

## 2019-09-15 DIAGNOSIS — H6983 Other specified disorders of Eustachian tube, bilateral: Secondary | ICD-10-CM | POA: Diagnosis not present

## 2019-09-15 DIAGNOSIS — H903 Sensorineural hearing loss, bilateral: Secondary | ICD-10-CM | POA: Diagnosis not present

## 2019-09-15 DIAGNOSIS — H9193 Unspecified hearing loss, bilateral: Secondary | ICD-10-CM | POA: Diagnosis not present

## 2019-09-15 DIAGNOSIS — C069 Malignant neoplasm of mouth, unspecified: Secondary | ICD-10-CM | POA: Diagnosis not present

## 2019-09-25 DIAGNOSIS — Z0000 Encounter for general adult medical examination without abnormal findings: Secondary | ICD-10-CM | POA: Diagnosis not present

## 2019-09-25 DIAGNOSIS — I10 Essential (primary) hypertension: Secondary | ICD-10-CM | POA: Diagnosis not present

## 2019-09-25 DIAGNOSIS — R5383 Other fatigue: Secondary | ICD-10-CM | POA: Diagnosis not present

## 2019-09-25 DIAGNOSIS — J449 Chronic obstructive pulmonary disease, unspecified: Secondary | ICD-10-CM | POA: Diagnosis not present

## 2019-09-25 DIAGNOSIS — E7800 Pure hypercholesterolemia, unspecified: Secondary | ICD-10-CM | POA: Diagnosis not present

## 2019-09-25 DIAGNOSIS — Z1159 Encounter for screening for other viral diseases: Secondary | ICD-10-CM | POA: Diagnosis not present

## 2019-09-25 DIAGNOSIS — E559 Vitamin D deficiency, unspecified: Secondary | ICD-10-CM | POA: Diagnosis not present

## 2019-09-25 DIAGNOSIS — I779 Disorder of arteries and arterioles, unspecified: Secondary | ICD-10-CM | POA: Diagnosis not present

## 2019-09-25 DIAGNOSIS — Z79899 Other long term (current) drug therapy: Secondary | ICD-10-CM | POA: Diagnosis not present

## 2019-10-13 DIAGNOSIS — Z1159 Encounter for screening for other viral diseases: Secondary | ICD-10-CM | POA: Diagnosis not present

## 2019-10-13 DIAGNOSIS — I10 Essential (primary) hypertension: Secondary | ICD-10-CM | POA: Diagnosis not present

## 2019-10-13 DIAGNOSIS — Z79899 Other long term (current) drug therapy: Secondary | ICD-10-CM | POA: Diagnosis not present

## 2019-10-13 DIAGNOSIS — I779 Disorder of arteries and arterioles, unspecified: Secondary | ICD-10-CM | POA: Diagnosis not present

## 2019-10-13 DIAGNOSIS — E7800 Pure hypercholesterolemia, unspecified: Secondary | ICD-10-CM | POA: Diagnosis not present

## 2019-10-13 DIAGNOSIS — E559 Vitamin D deficiency, unspecified: Secondary | ICD-10-CM | POA: Diagnosis not present

## 2019-10-13 DIAGNOSIS — R5383 Other fatigue: Secondary | ICD-10-CM | POA: Diagnosis not present

## 2019-10-13 DIAGNOSIS — J449 Chronic obstructive pulmonary disease, unspecified: Secondary | ICD-10-CM | POA: Diagnosis not present

## 2019-10-16 DIAGNOSIS — M79643 Pain in unspecified hand: Secondary | ICD-10-CM | POA: Diagnosis not present

## 2019-10-16 DIAGNOSIS — J449 Chronic obstructive pulmonary disease, unspecified: Secondary | ICD-10-CM | POA: Diagnosis not present

## 2019-10-16 DIAGNOSIS — I10 Essential (primary) hypertension: Secondary | ICD-10-CM | POA: Diagnosis not present

## 2019-10-16 DIAGNOSIS — G4710 Hypersomnia, unspecified: Secondary | ICD-10-CM | POA: Diagnosis not present

## 2019-10-30 DIAGNOSIS — I10 Essential (primary) hypertension: Secondary | ICD-10-CM | POA: Diagnosis not present

## 2019-10-30 DIAGNOSIS — M79643 Pain in unspecified hand: Secondary | ICD-10-CM | POA: Diagnosis not present

## 2019-10-30 DIAGNOSIS — G4710 Hypersomnia, unspecified: Secondary | ICD-10-CM | POA: Diagnosis not present

## 2019-10-30 DIAGNOSIS — J449 Chronic obstructive pulmonary disease, unspecified: Secondary | ICD-10-CM | POA: Diagnosis not present

## 2019-11-27 ENCOUNTER — Ambulatory Visit: Payer: PPO | Admitting: Cardiology

## 2019-11-27 ENCOUNTER — Telehealth: Payer: Self-pay | Admitting: Cardiology

## 2019-11-27 ENCOUNTER — Other Ambulatory Visit: Payer: Self-pay

## 2019-11-27 ENCOUNTER — Encounter: Payer: Self-pay | Admitting: Cardiology

## 2019-11-27 VITALS — BP 108/60 | HR 59 | Ht 63.0 in | Wt 136.0 lb

## 2019-11-27 DIAGNOSIS — E7800 Pure hypercholesterolemia, unspecified: Secondary | ICD-10-CM | POA: Diagnosis not present

## 2019-11-27 DIAGNOSIS — I2510 Atherosclerotic heart disease of native coronary artery without angina pectoris: Secondary | ICD-10-CM

## 2019-11-27 DIAGNOSIS — I700 Atherosclerosis of aorta: Secondary | ICD-10-CM

## 2019-11-27 DIAGNOSIS — I10 Essential (primary) hypertension: Secondary | ICD-10-CM

## 2019-11-27 DIAGNOSIS — R42 Dizziness and giddiness: Secondary | ICD-10-CM

## 2019-11-27 DIAGNOSIS — R001 Bradycardia, unspecified: Secondary | ICD-10-CM

## 2019-11-27 DIAGNOSIS — Z720 Tobacco use: Secondary | ICD-10-CM

## 2019-11-27 DIAGNOSIS — I495 Sick sinus syndrome: Secondary | ICD-10-CM | POA: Diagnosis not present

## 2019-11-27 NOTE — Progress Notes (Signed)
Cardiology Office Note   Date:  11/27/2019   ID:  Casey Kirk, DOB Mar 09, 1946, MRN [id number]  PCP:  Lawerance Cruel, MD  Cardiologist:  Dr. Marlou Porch    Chief Complaint  Patient presents with  . Dizziness      History of Present Illness: Casey Kirk is a 74 y.o. female who presents for lightheadedness and HR to 46, BP 109/60   coronary artery disease catheterization 2018 showing 80% mid LAD stenosis with FFR 0.7 with drug-eluting stent x1 performed.  Same-day discharge.  Also has comorbidities of COPD bipolar disorder GERD IBS hyperlipidemia thyroid disease.  Prior ABIs were normal in 2018 Carotid Doppler showed mild plaque left and right  Overall been doing quite well.  Previously had asked about redness in her palms of hands and cracking, mention red palms again.  No fevers no other rheumatologic illness.  Taking Crestor.  No myalgias.  Today she called with lightheadedness and her Bp was 109 systolic when last visit she ranged between 120 and 130.  Her HR was 46.  Last month due to HTN placed on avapro 150 mg by Dr. Harrington Challenger.  For last 2 weeks has been weak and tired, and lightheaded, no chest pain just her usual COPD symptoms.  She may have decreased eating and drinking as well due to not feeling well.      Past Medical History:  Diagnosis Date  . Allergic rhinitis   . Aortic atherosclerosis ([place]) 08/17/2016  . Arthritis   . ARTHRITIS, HX OF 01/09/2007  . BIPOLAR II DISORDER 03/17/2008  . 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 COPD 12/21/2006  . COPD (chronic obstructive pulmonary disease) ([place]) 12/21/2006   Emphysema on CT 06/2016 02/2016 FEV1 of 98%    . Coronary artery calcification 08/17/2016  . Coronary artery disease involving native coronary artery of native heart with unstable angina pectoris ([place])   . Dysphasia 05/10/2011  . Family history of early CAD 08/17/2016  . GERD (gastroesophageal reflux disease)   . GOUT 01/09/2007  . Hx of colonic polyps   .  Hypothyroidism 10/17/2010  . IBS (irritable bowel syndrome)   . Osteoporosis   . Sinusitis   . Thyroid disease   . TINEA CORPORIS 10/06/2009  . Tobacco abuse 05/10/2011    Past Surgical History:  Procedure Laterality Date  . ABDOMINAL HYSTERECTOMY     CA cells removed end of vagina  . BREAST SURGERY     biopsy  . COLONOSCOPY    . CORONARY STENT INTERVENTION N/A 03/16/2017   Procedure: CORONARY STENT INTERVENTION;  Surgeon: Burnell Blanks, MD;  Location: [place] [facility];  Service: Cardiovascular;  Laterality: N/A;  . EYE SURGERY Right    cataract  . INTRAVASCULAR PRESSURE WIRE/FFR STUDY N/A 03/16/2017   Procedure: INTRAVASCULAR PRESSURE WIRE/FFR STUDY;  Surgeon: Burnell Blanks, MD;  Location: [place] [facility];  Service: Cardiovascular;  Laterality: N/A;  . LEFT HEART CATH AND CORONARY ANGIOGRAPHY N/A 03/16/2017   Procedure: LEFT HEART CATH AND CORONARY ANGIOGRAPHY;  Surgeon: Burnell Blanks, MD;  Location: [facility] [place] [facility];  Service: Cardiovascular;  Laterality: N/A;  . LEFT HEART CATH AND CORONARY ANGIOGRAPHY N/A 01/30/2018   Procedure: LEFT HEART CATH AND CORONARY ANGIOGRAPHY;  Surgeon: Burnell Blanks, MD;  Location: [facility];  Service: Cardiovascular;  Laterality: N/A;  . MASS EXCISION Right 05/10/2018   Procedure: EXCISION MASS OF RIGHT PALATAL  WITH LOCAL SOFT TISSUE RECONSTRUCTION;  Surgeon:  Jerrell Belfast, MD;  Location: [facility];  Service: ENT;  Laterality: Right;  . TOOTH EXTRACTION    . vaginal carcinoma surgery       Current Outpatient Medications  Medication Sig Dispense Refill  . acetaminophen (TYLENOL) 500 MG tablet Take 500 mg by mouth every 8 (eight) hours as needed (pain).    Marland Kitchen albuterol (PROVENTIL HFA;VENTOLIN HFA) 108 (90 Base) MCG/ACT inhaler Inhale 2 puffs into the lungs every 6 (six) hours as needed for wheezing or shortness of breath. 1 Inhaler 5  . aspirin EC 81 MG tablet Take 81 mg by mouth daily.    Marland Kitchen  CALCIUM/MAGNESIUM/ZINC FORMULA PO Take 1 tablet by mouth 3 (three) times a week.    . cholecalciferol (VITAMIN D) 1000 units tablet Take 1,000 Units by mouth daily.    . diphenhydrAMINE (BENADRYL) 25 mg capsule Take 25 mg by mouth daily as needed for allergies.    . diphenoxylate-atropine (LOMOTIL) 2.5-0.025 MG tablet TAKE 2 TABLETS BY MOUTH AS NEEDED 4 TIMES A DAY    . fish oil-omega-3 fatty acids 1000 MG capsule Take 1 g by mouth 2 (two) times a week. qod    . fluticasone (FLONASE) 50 MCG/ACT nasal spray Place 2 sprays into both nostrils daily as needed for allergies or rhinitis.    . Melatonin 3 MG TABS Take 3 mg by mouth at bedtime.    . Multiple Vitamin (MULTIVITAMIN) tablet Take 1 tablet by mouth daily.      . nicotine polacrilex (NICORETTE) 4 MG gum Take 4 mg by mouth as needed for smoking cessation.    . rosuvastatin (CRESTOR) 20 MG tablet Take 20 mg by mouth daily.     . traZODone (DESYREL) 50 MG tablet Take 0.5-1 tablets (25-50 mg total) by mouth at bedtime as needed. 90 tablet 1  . Turmeric Curcumin 500 MG CAPS Take 1 capsule by mouth every other day.    . vitamin C (ASCORBIC ACID) 500 MG tablet Take 500 mg by mouth daily.    . vitamin E 100 UNIT capsule Take 100 Units by mouth daily.      No current facility-administered medications for this visit.    Allergies:   Hydrocodone-acetaminophen, Latex, and Pneumococcal polysaccharide vaccine    Social History:  The patient  reports that she has been smoking cigarettes. She has a 10.00 pack-year smoking history. She has never used smokeless tobacco. She reports previous alcohol use. She reports that she does not use drugs.   Family History:  The patient's family history includes Alcohol abuse in an other family member; Arthritis in an other family member; COPD in her mother; Cancer in an other family member; Colon polyps in her brother and mother; Diabetes in an other family member; Heart attack (age of onset: 42) in her brother; Heart  disease in an other family member; Hyperlipidemia in an other family member; Hypertension in an other family member; Sudden death in an other family member; Tremor in her mother.    ROS:  General:no colds or fevers, no weight changes Skin:no rashes or ulcers HEENT:no blurred vision, no congestion CV:see HPI PUL:see HPI GI:no diarrhea constipation or melena, no indigestion GU:no hematuria, no dysuria MS:no joint pain, no claudication Neuro:no syncope, no lightheadedness Endo:no diabetes, + thyroid disease  Wt Readings from Last 3 Encounters:  11/27/19 136 lb (61.7 kg)  08/13/19 136 lb (61.7 kg)  02/10/19 133 lb 9.6 oz (60.6 kg)     PHYSICAL EXAM: VS:  BP 108/60  Pulse (!) 59   Ht 5\' 3"  (1.6 m)   Wt 136 lb (61.7 kg)   SpO2 99%   BMI 24.09 kg/m  , BMI Body mass index is 24.09 kg/m. General:Pleasant affect, NAD Skin:Warm and dry, brisk capillary refill HEENT:normocephalic, sclera clear, mucus membranes moist Neck:supple, no JVD, no bruits  Heart:S1S2 RRR without murmur, gallup, rub or click Lungs:clear without rales, rhonchi, or wheezes FAO:ZHYQ, non tender, + BS, do not palpate liver spleen or masses Ext:no lower ext edema, 2+ pedal pulses, 2+ radial pulses Neuro:alert and oriented, MAE, follows commands, + facial symmetry  BP lying 110/62 Sitting 108/60 standing 98/62 and standing for 3 min 100/64 and P 66   EKG:  EKG is ordered today. The ekg ordered today demonstrates SB at 24 PAC no ST changes and stable EKG    Recent Labs: No results found for requested labs within last 8760 hours.    Lipid Panel    Component Value Date/Time   CHOL 107 05/10/2017 0832   TRIG 94 05/10/2017 0832   HDL 51 05/10/2017 0832   CHOLHDL 2.1 05/10/2017 0832   CHOLHDL 5 09/22/2010 0843   VLDL 20.6 09/22/2010 0843   LDLCALC 37 05/10/2017 0832       Other studies Reviewed: Additional studies/ records that were reviewed today include: . Cardiac cath 2019  Previously placed Mid  LAD drug eluting stent is widely patent.  Mid RCA lesion is 20% stenosed.  The left ventricular systolic function is normal.  LV end diastolic pressure is normal.  The left ventricular ejection fraction is greater than 65% by visual estimate.  There is no mitral valve regurgitation.  Ost Cx to Prox Cx lesion is 20% stenosed.   1. Single vessel CAD with patent stent mid LAD without restenosis 2. Mild non-obstructive disease in the mid RCA and ostial Circumflex.  3. Normal LV systolic function.   Recommendations: Continue medical management of CAD. Tobacco cessation is advised.      ASSESSMENT AND PLAN:  1.  Lightheadedness on new BP med - will hold avapro for now. Check BMP and CBC and Mg+ and TSH   She will follow up in 1 week with APP for BP check may need to go back on medication but lower dose. 2.  Sinus brady was lower this AM and on no AV slowing meds will have her wear 14 day zio patch to eval for heart block  And significant brady.  3.  CAD with stent hx no chest pain 4.  COPD and tobacco use   Current medicines are reviewed with the patient today.  The patient Has no concerns regarding medicines.  The following changes have been made:  See above Labs/ tests ordered today include:see above  Disposition:   FU:  see above  Signed, Cecilie Kicks, NP  11/27/2019 3:43 PM    [facility] [place], [place], [place], [place] Phone: [phone]; Fax: [phone]

## 2019-11-27 NOTE — Telephone Encounter (Signed)
Pt c/o BP issue: STAT if pt c/o blurred vision, one-sided weakness or slurred speech  1. What are your last 5 BP readings?  109/62  2. Are you having any other symptoms (ex. Dizziness, headache, blurred vision, passed out)? Dizzy, and anxious.   3. What is your BP issue? States her BP is running low and would like to be checked out today.

## 2019-11-27 NOTE — Telephone Encounter (Signed)
Spoke with patient who is reporting having lightheadedness, dizziness and hypotension.  Reports her BP is 109/60 and HR 46.  She does not know if this is correct but d/t s/s would like to come in to be seen today for further evaluation.  Added onto schedule with Cecilie Kicks, NP today.  Pt is agreeable and expressed gratitude for the call back and appt.

## 2019-11-27 NOTE — Patient Instructions (Signed)
Medication Instructions:  Your physician has recommended you make the following change in your medication:   STOP taking Irbesartan**   *If you need a refill on your cardiac medications before your next appointment, please call your pharmacy*   Lab Work: BMET, CBC, TSH, Magnesium Today  If you have labs (blood work) drawn today and your tests are completely normal, you will receive your results only by: Marland Kitchen MyChart Message (if you have MyChart) OR . A paper copy in the mail If you have any lab test that is abnormal or we need to change your treatment, we will call you to review the results.   Testing/Procedures: Your physician has recommended that you wear a Zio Patch Monitor. These monitors are medical devices that record the heart's electrical activity. Doctors most often use these monitors to diagnose arrhythmias. Arrhythmias are problems with the speed or rhythm of the heartbeat. The monitor is a small, portable device. You can wear one while you do your normal daily activities. This is usually used to diagnose what is causing palpitations/syncope (passing out).     Follow-Up: At [facility], you and your health needs are our priority.  As part of our continuing mission to provide you with exceptional heart care, we have created designated Provider Care Teams.  These Care Teams include your primary Cardiologist (physician) and Advanced Practice Providers (APPs -  Physician Assistants and Nurse Practitioners) who all work together to provide you with the care you need, when you need it.  We recommend signing up for the patient portal called "MyChart".  Sign up information is provided on this After Visit Summary.  MyChart is used to connect with patients for Virtual Visits (Telemedicine).  Patients are able to view lab/test results, encounter notes, upcoming appointments, etc.  Non-urgent messages can be sent to your provider as well.   To learn more about what you can do with MyChart,  go to [email or web address].    Your next appointment:   12/02/2019 @ 8:15 with Truitt Merle, NP   Other Instructions [place] Monitor Instructions   Your physician has requested you wear your ZIO patch monitor___14____days.   This is a single patch monitor.  [employer] supplies one patch monitor per enrollment.  Additional stickers are not available.   Please do not apply patch if you will be having a Nuclear Stress Test, Echocardiogram, Cardiac CT, MRI, or Chest Xray during the time frame you would be wearing the monitor. The patch cannot be worn during these tests.  You cannot remove and re-apply the ZIO XT patch monitor.   Your ZIO patch monitor will be sent USPS Priority mail from [facility] directly to your home address. The monitor may also be mailed to a PO BOX if home delivery is not available.   It may take 3-5 days to receive your monitor after you have been enrolled.   Once you have received you monitor, please review enclosed instructions.  Your monitor has already been registered assigning a specific monitor serial # to you.   Applying the monitor   Shave hair from upper left chest.   Hold abrader disc by orange tab.  Rub abrader in 40 strokes over left upper chest as indicated in your monitor instructions.   Clean area with 4 enclosed alcohol pads .  Use all pads to assure are is cleaned thoroughly.  Let dry.   Apply patch as indicated in monitor instructions.  Patch will be place under collarbone on left  side of chest with arrow pointing upward.   Rub patch adhesive wings for 2 minutes.Remove white label marked "1".  Remove white label marked "2".  Rub patch adhesive wings for 2 additional minutes.   While looking in a mirror, press and release button in center of patch.  A small green light will flash 3-4 times .  This will be your only indicator the monitor has been turned on.     Do not shower for the first 24 hours.  You may shower after the  first 24 hours.   Press button if you feel a symptom. You will hear a small click.  Record Date, Time and Symptom in the Patient Log Book.   When you are ready to remove patch, follow instructions on last 2 pages of Patient Log Book.  Stick patch monitor onto last page of Patient Log Book.   Place Patient Log Book in [place] box.  Use locking tab on box and tape box closed securely.  The [employer] has [employer] on it.  Please place in mailbox as soon as possible.  Your physician should have your test results approximately 7 days after the monitor has been mailed back to [facility]   Call Catherine at [phone] if you have questions regarding your ZIO XT patch monitor.  Call them immediately if you see an orange light blinking on your monitor.   If your monitor falls off in less than 4 days contact our Monitor department at [phone].  If your monitor becomes loose or falls off after 4 days call [employer] at [phone] for suggestions on securing your monitor.

## 2019-11-28 LAB — CBC
Hematocrit: 43.8 % (ref 34.0–46.6)
Hemoglobin: 14.1 g/dL (ref 11.1–15.9)
MCH: 28.8 pg (ref 26.6–33.0)
MCHC: 32.2 g/dL (ref 31.5–35.7)
MCV: 89 fL (ref 79–97)
Platelets: 177 10*3/uL (ref 150–450)
RBC: 4.9 x10E6/uL (ref 3.77–5.28)
RDW: 12.2 % (ref 11.7–15.4)
WBC: 10.1 10*3/uL (ref 3.4–10.8)

## 2019-11-28 LAB — BASIC METABOLIC PANEL
BUN/Creatinine Ratio: 13 (ref 12–28)
BUN: 10 mg/dL (ref 8–27)
CO2: 24 mmol/L (ref 20–29)
Calcium: 9.2 mg/dL (ref 8.7–10.3)
Chloride: 106 mmol/L (ref 96–106)
Creatinine, Ser: 0.75 mg/dL (ref 0.57–1.00)
GFR calc Af Amer: 93 mL/min/{1.73_m2} (ref >59)
GFR calc non Af Amer: 80 mL/min/{1.73_m2} (ref >59)
Glucose: 118 mg/dL — ABNORMAL HIGH (ref 65–99)
Potassium: 4 mmol/L (ref 3.5–5.2)
Sodium: 144 mmol/L (ref 134–144)

## 2019-11-28 LAB — MAGNESIUM: Magnesium: 1.9 mg/dL (ref 1.6–2.3)

## 2019-11-28 LAB — TSH: TSH: 2.26 u[IU]/mL (ref 0.450–4.500)

## 2019-11-28 NOTE — Progress Notes (Signed)
CARDIOLOGY OFFICE NOTE  Date:  12/02/2019    Casey Kirk Date of Birth: May 29, 1945 Medical Record #[id number]  PCP:  Lawerance Cruel, MD  Cardiologist:  [facility]  Chief Complaint  Patient presents with  . Follow-up    History of Present Illness: Casey Kirk is a 74 y.o. female who presents today for a one week check. Seen for Dr. Marlou Porch.   She has known CAD with prior cath in 2018 showing 80% mid LAD stenosis with FFR 0.7 with DES x1 performed. Other issues include COPD, bipolar disorder,  GERD, IBS, HLD, thyroid disease.  Prior ABIs were normal in 2018. Mild plaque on carotid doppler study.   Last seen by Dr. Marlou Porch in March - felt to be doing ok. Seen as a work in last week by Mickel Baas with dizziness/lightheadedness and low HR. Had been placed on Avapro by her PCP for elevated BP. This was held. 14 day monitor was placed. She is not on any rate slowing medicines.   Comes in today. Here alone. Seems to feel some better but still with some lightheadedness. No syncope. Remains short of breath that she attributes to her COPD and the humidity with the heat. She continues to smoke. Not really sure if she is ready to stop. She has her monitor on now - just put on yesterday. Has questions about this. No chest discomfort reported. Still does not feel "right" but not really able to describe to me as to how/why. On no cardiac medicines other than aspirin and statin. Her labs were all ok.   Past Medical History:  Diagnosis Date  . Allergic rhinitis   . Aortic atherosclerosis ([place]) 08/17/2016  . Arthritis   . ARTHRITIS, HX OF 01/09/2007  . BIPOLAR II DISORDER 03/17/2008  . 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 COPD 12/21/2006  . COPD (chronic obstructive pulmonary disease) ([place]) 12/21/2006   Emphysema on CT 06/2016 02/2016 FEV1 of 98%    . Coronary artery calcification 08/17/2016  . Coronary artery disease involving native coronary artery of native heart with unstable angina pectoris  ([place])   . Dysphasia 05/10/2011  . Family history of early CAD 08/17/2016  . GERD (gastroesophageal reflux disease)   . GOUT 01/09/2007  . Hx of colonic polyps   . Hypothyroidism 10/17/2010  . IBS (irritable bowel syndrome)   . Osteoporosis   . Sinusitis   . Thyroid disease   . TINEA CORPORIS 10/06/2009  . Tobacco abuse 05/10/2011    Past Surgical History:  Procedure Laterality Date  . ABDOMINAL HYSTERECTOMY     CA cells removed end of vagina  . BREAST SURGERY     biopsy  . COLONOSCOPY    . CORONARY STENT INTERVENTION N/A 03/16/2017   Procedure: CORONARY STENT INTERVENTION;  Surgeon: Burnell Blanks, MD;  Location: [place] [facility];  Service: Cardiovascular;  Laterality: N/A;  . EYE SURGERY Right    cataract  . INTRAVASCULAR PRESSURE WIRE/FFR STUDY N/A 03/16/2017   Procedure: INTRAVASCULAR PRESSURE WIRE/FFR STUDY;  Surgeon: Burnell Blanks, MD;  Location: [place] [facility];  Service: Cardiovascular;  Laterality: N/A;  . LEFT HEART CATH AND CORONARY ANGIOGRAPHY N/A 03/16/2017   Procedure: LEFT HEART CATH AND CORONARY ANGIOGRAPHY;  Surgeon: Burnell Blanks, MD;  Location: [place] [facility];  Service: Cardiovascular;  Laterality: N/A;  . LEFT HEART CATH AND CORONARY ANGIOGRAPHY N/A 01/30/2018   Procedure: LEFT HEART CATH AND CORONARY ANGIOGRAPHY;  Surgeon: Lauree Chandler  D, MD;  Location: [place] [facility];  Service: Cardiovascular;  Laterality: N/A;  . MASS EXCISION Right 05/10/2018   Procedure: EXCISION MASS OF RIGHT PALATAL  WITH LOCAL SOFT TISSUE RECONSTRUCTION;  Surgeon: Jerrell Belfast, MD;  Location: [facility];  Service: ENT;  Laterality: Right;  . TOOTH EXTRACTION    . vaginal carcinoma surgery       Medications: Current Meds  Medication Sig  . acetaminophen (TYLENOL) 500 MG tablet Take 500 mg by mouth every 8 (eight) hours as needed (pain).  Marland Kitchen albuterol (PROVENTIL HFA;VENTOLIN HFA) 108 (90 Base) MCG/ACT inhaler Inhale 2 puffs into the lungs  every 6 (six) hours as needed for wheezing or shortness of breath.  Marland Kitchen aspirin EC 81 MG tablet Take 81 mg by mouth daily.  Marland Kitchen CALCIUM/MAGNESIUM/ZINC FORMULA PO Take 1 tablet by mouth 3 (three) times a week.  . cholecalciferol (VITAMIN D) 1000 units tablet Take 1,000 Units by mouth daily.  . diphenhydrAMINE (BENADRYL) 25 mg capsule Take 25 mg by mouth daily as needed for allergies.  . diphenoxylate-atropine (LOMOTIL) 2.5-0.025 MG tablet TAKE 2 TABLETS BY MOUTH AS NEEDED 4 TIMES A DAY  . fish oil-omega-3 fatty acids 1000 MG capsule Take 1 g by mouth 2 (two) times a week. qod  . fluticasone (FLONASE) 50 MCG/ACT nasal spray Place 2 sprays into both nostrils daily as needed for allergies or rhinitis.  . Melatonin 3 MG TABS Take 3 mg by mouth at bedtime.  . Multiple Vitamin (MULTIVITAMIN) tablet Take 1 tablet by mouth daily.    . nicotine polacrilex (NICORETTE) 4 MG gum Take 4 mg by mouth as needed for smoking cessation.  . rosuvastatin (CRESTOR) 20 MG tablet Take 20 mg by mouth daily.   . traZODone (DESYREL) 50 MG tablet Take 0.5-1 tablets (25-50 mg total) by mouth at bedtime as needed.  . Turmeric Curcumin 500 MG CAPS Take 1 capsule by mouth every other day.  . vitamin C (ASCORBIC ACID) 500 MG tablet Take 500 mg by mouth daily.  . vitamin E 100 UNIT capsule Take 100 Units by mouth daily.      Allergies: Allergies  Allergen Reactions  . Hydrocodone-Acetaminophen Itching and Swelling  . Latex Itching and Swelling  . Pneumococcal Polysaccharide Vaccine Swelling    Fever    Social History: The patient  reports that she has been smoking cigarettes. She has a 10.00 pack-year smoking history. She has never used smokeless tobacco. She reports previous alcohol use. She reports that she does not use drugs.   Family History: The patient's family history includes Alcohol abuse in an other family member; Arthritis in an other family member; COPD in her mother; Cancer in an other family member; Colon  polyps in her brother and mother; Diabetes in an other family member; Heart attack (age of onset: 57) in her brother; Heart disease in an other family member; Hyperlipidemia in an other family member; Hypertension in an other family member; Sudden death in an other family member; Tremor in her mother.   Review of Systems: Please see the history of present illness.   All other systems are reviewed and negative.   Physical Exam: VS:  BP 124/80   Pulse (!) 59   Ht 5\' 3"  (1.6 m)   Wt 136 lb 6.4 oz (61.9 kg)   SpO2 98%   BMI 24.16 kg/m  .  BMI Body mass index is 24.16 kg/m.  Wt Readings from Last 3 Encounters:  12/02/19 136 lb 6.4 oz (  61.9 kg)  11/27/19 136 lb (61.7 kg)  08/13/19 136 lb (61.7 kg)    General: Alert and in no acute distress. Has some type of speech impediment.   Neck: Supple, no JVD, carotid bruits, or masses noted.  Cardiac: Regular rate with ectopics. No edema.  Respiratory:  Lungs are somewhat coarse with normal work of breathing.  GI: Soft and nontender.  MS: No deformity or atrophy. Gait and ROM intact.  Skin: Warm and dry. Color is normal.  Neuro:  Strength and sensation are intact and no gross focal deficits noted.  Psych: Alert, appropriate and with normal affect.   LABORATORY DATA:  EKG:  EKG is ordered today.  Personally reviewed by me. This demonstrates sinus, PACs PJCs, variable PR and probable junctional beats as well - HR is 59 today.  Lab Results  Component Value Date   WBC 10.1 11/27/2019   HGB 14.1 11/27/2019   HCT 43.8 11/27/2019   PLT 177 11/27/2019   GLUCOSE 118 (H) 11/27/2019   CHOL 107 05/10/2017   TRIG 94 05/10/2017   HDL 51 05/10/2017   LDLCALC 37 05/10/2017   ALT 15 03/12/2017   AST 18 03/12/2017   NA 144 11/27/2019   K 4.0 11/27/2019   CL 106 11/27/2019   CREATININE 0.75 11/27/2019   BUN 10 11/27/2019   CO2 24 11/27/2019   TSH 2.260 11/27/2019   INR 1.1 03/12/2017       BNP (last 3 results) No results for input(s): BNP  in the last 8760 hours.  ProBNP (last 3 results) No results for input(s): PROBNP in the last 8760 hours.   Other Studies Reviewed Today:  Cardiac cath 2019  Previously placed Mid LAD drug eluting stent is widely patent.  Mid RCA lesion is 20% stenosed.  The left ventricular systolic function is normal.  LV end diastolic pressure is normal.  The left ventricular ejection fraction is greater than 65% by visual estimate.  There is no mitral valve regurgitation.  Ost Cx to Prox Cx lesion is 20% stenosed.  1. Single vessel CAD with patent stent mid LAD without restenosis 2. Mild non-obstructive disease in the mid RCA and ostial Circumflex.  3. Normal LV systolic function.   Recommendations: Continue medical management of CAD. Tobacco cessation is advised.     ASSESSMENT AND PLAN:  1. Lightheadedness - does not seem resolved. Now with her monitor in place. Will see what this shows. HR is 59 today.   2. CAD - prior stent placement - no active chest pain noted.   3. Bradycardia - HR is 59 today - looks to have some degree of sick sinus - her monitor is transmitting and her questions were answered.   4. COPD with ongoing tobacco abuse - needs smoking cessation.   5. BP issues - BP is fine here - I do not think she needs this at this time but does need to monitor periodically.   Current medicines are reviewed with the patient today.  The patient does not have concerns regarding medicines other than what has been noted above.  The following changes have been made:  See above.  Labs/ tests ordered today include:    Orders Placed This Encounter  Procedures  . EKG 12-Lead     Disposition:   FU with Dr. Marlou Porch for discussion following her monitor.   Patient is agreeable to this plan and will call if any problems develop in the interim.   Signed: Truitt Merle, NP  12/02/2019 8:32 AM  [facility] [address] [place], [place]  [address] Phone: [phone] Fax: [phone]

## 2019-12-01 ENCOUNTER — Ambulatory Visit (INDEPENDENT_AMBULATORY_CARE_PROVIDER_SITE_OTHER): Payer: PPO

## 2019-12-01 DIAGNOSIS — R001 Bradycardia, unspecified: Secondary | ICD-10-CM | POA: Diagnosis not present

## 2019-12-02 ENCOUNTER — Encounter: Payer: Self-pay | Admitting: Nurse Practitioner

## 2019-12-02 ENCOUNTER — Ambulatory Visit: Payer: PPO | Admitting: Nurse Practitioner

## 2019-12-02 ENCOUNTER — Other Ambulatory Visit: Payer: Self-pay

## 2019-12-02 VITALS — BP 124/80 | HR 59 | Ht 63.0 in | Wt 136.4 lb

## 2019-12-02 DIAGNOSIS — R42 Dizziness and giddiness: Secondary | ICD-10-CM | POA: Diagnosis not present

## 2019-12-02 DIAGNOSIS — R001 Bradycardia, unspecified: Secondary | ICD-10-CM

## 2019-12-02 DIAGNOSIS — I2510 Atherosclerotic heart disease of native coronary artery without angina pectoris: Secondary | ICD-10-CM

## 2019-12-02 DIAGNOSIS — I495 Sick sinus syndrome: Secondary | ICD-10-CM | POA: Diagnosis not present

## 2019-12-02 DIAGNOSIS — Z720 Tobacco use: Secondary | ICD-10-CM | POA: Diagnosis not present

## 2019-12-02 DIAGNOSIS — I10 Essential (primary) hypertension: Secondary | ICD-10-CM

## 2019-12-02 NOTE — Patient Instructions (Addendum)
After Visit Summary:  We will be checking the following labs today - NONE   Medication Instructions:    Continue with your current medicines.    If you need a refill on your cardiac medications before your next appointment, please call your pharmacy.     Testing/Procedures To Be Arranged:  N/A  Follow-Up:  See Dr. Marlou Porch after the monitor for further discussion.    At [facility], you and your health needs are our priority.  As part of our continuing mission to provide you with exceptional heart care, we have created designated Provider Care Teams.  These Care Teams include your primary Cardiologist (physician) and Advanced Practice Providers (APPs -  Physician Assistants and Nurse Practitioners) who all work together to provide you with the care you need, when you need it.  Special Instructions:  . Stay safe, wash your hands for at least 20 seconds and wear a mask when needed.  . It was good to talk with you today.  . Try to periodically check some blood pressures for [place] . Think about working on your smoking.    Call the [place] office at [phone] if you have any questions, problems or concerns.

## 2019-12-24 DIAGNOSIS — M79641 Pain in right hand: Secondary | ICD-10-CM | POA: Diagnosis not present

## 2019-12-30 ENCOUNTER — Ambulatory Visit: Payer: PPO | Admitting: Physician Assistant

## 2020-01-05 DIAGNOSIS — J209 Acute bronchitis, unspecified: Secondary | ICD-10-CM | POA: Diagnosis not present

## 2020-01-05 DIAGNOSIS — J42 Unspecified chronic bronchitis: Secondary | ICD-10-CM | POA: Diagnosis not present

## 2020-01-05 DIAGNOSIS — J449 Chronic obstructive pulmonary disease, unspecified: Secondary | ICD-10-CM | POA: Diagnosis not present

## 2020-01-08 ENCOUNTER — Ambulatory Visit (INDEPENDENT_AMBULATORY_CARE_PROVIDER_SITE_OTHER): Payer: PPO | Admitting: Physician Assistant

## 2020-01-08 ENCOUNTER — Other Ambulatory Visit: Payer: Self-pay

## 2020-01-08 ENCOUNTER — Encounter: Payer: Self-pay | Admitting: Physician Assistant

## 2020-01-08 DIAGNOSIS — F17200 Nicotine dependence, unspecified, uncomplicated: Secondary | ICD-10-CM | POA: Diagnosis not present

## 2020-01-08 DIAGNOSIS — F99 Mental disorder, not otherwise specified: Secondary | ICD-10-CM | POA: Diagnosis not present

## 2020-01-08 DIAGNOSIS — F5105 Insomnia due to other mental disorder: Secondary | ICD-10-CM | POA: Diagnosis not present

## 2020-01-08 MED ORDER — TRAZODONE HCL 50 MG PO TABS: 25.0000 mg | ORAL_TABLET | Freq: Every evening | ORAL | 1 refills | Status: DC | PRN

## 2020-01-08 NOTE — Progress Notes (Signed)
[facility] Med Check  Patient ID: Casey Kirk,  MRN: [id number]  PCP: Lawerance Cruel, MD  Date of Evaluation: 01/09/2020 Time spent:20 minutes  Chief Complaint:  Chief Complaint    Insomnia; Follow-up      HISTORY/CURRENT STATUS: HPI for 41-month med check.  Is doing well. She sleeps good as long as she has the Trazodone, and Melatonin prn. Feels rested when she gets up.   Enjoys going out on the boat with her husband, gets together with girlfriends at least once a month, enjoys her son and grandson coming in and out visiting, enjoys her dog named Chiropractor. Energy and motivation are good. Not isolating. No SI/HI.  Patient denies increased energy with decreased need for sleep, no increased talkativeness, no racing thoughts, no impulsivity or risky behaviors, no increased spending, no increased libido, no grandiosity, no increased irritability or anger, and no hallucinations.  We again discussed her diagnosis of bipolar 2 disorder.  After going over her past medical history, I think that the symptoms she had between 2008 and 2009 were due to lack of sleep and stress.  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  She would only get 3 or 4 hours of sleep every night.  She kept that scheduled for several years.  States she had an episode of psychosis, anxiety, and depressive symptoms.  She was diagnosed at that time with bipolar disorder.  She had never had any symptoms prior to that and none since.  She was admitted to [facility] behavioral health in 2008.  She was never given any meds other than the Lamictal and trazodone.  No antipsychotics were ever prescribed.  States she has since learned how important sleep is for her physical and mental health.  Denies dizziness, syncope, seizures, numbness, tingling, tremor, tics, unsteady gait, slurred speech, confusion. Denies muscle or joint pain, stiffness, or dystonia.  Individual Medical History/ Review of Systems:  Changes? :Yes  Had hypotension that has been evaluated by sounds like a 14-day Holter monitor.  Results are not known yet.  She will see her cardiologist in a few weeks.  Past medications for mental health diagnoses include: Lamictal, Trazodone  Allergies: Hydrocodone-acetaminophen, Latex, and Pneumococcal polysaccharide vaccine  Current Medications:  Current Outpatient Medications:  .  acetaminophen (TYLENOL) 500 MG tablet, Take 500 mg by mouth every 8 (eight) hours as needed (pain)., Disp: , Rfl:  .  albuterol (PROVENTIL HFA;VENTOLIN HFA) 108 (90 Base) MCG/ACT inhaler, Inhale 2 puffs into the lungs every 6 (six) hours as needed for wheezing or shortness of breath., Disp: 1 Inhaler, Rfl: 5 .  aspirin EC 81 MG tablet, Take 81 mg by mouth daily., Disp: , Rfl:  .  CALCIUM/MAGNESIUM/ZINC FORMULA PO, Take 1 tablet by mouth 3 (three) times a week., Disp: , Rfl:  .  cholecalciferol (VITAMIN D) 1000 units tablet, Take 1,000 Units by mouth daily., Disp: , Rfl:  .  diphenhydrAMINE (BENADRYL) 25 mg capsule, Take 25 mg by mouth daily as needed for allergies., Disp: , Rfl:  .  diphenoxylate-atropine (LOMOTIL) 2.5-0.025 MG tablet, TAKE 2 TABLETS BY MOUTH AS NEEDED 4 TIMES A DAY, Disp: , Rfl:  .  fish oil-omega-3 fatty acids 1000 MG capsule, Take 1 g by mouth 2 (two) times a week. qod, Disp: , Rfl:  .  fluticasone (FLONASE) 50 MCG/ACT nasal spray, Place 2 sprays into both nostrils daily as needed for allergies or rhinitis., Disp: , Rfl:  .  Melatonin  3 MG TABS, Take 3 mg by mouth at bedtime., Disp: , Rfl:  .  Multiple Vitamin (MULTIVITAMIN) tablet, Take 1 tablet by mouth daily.  , Disp: , Rfl:  .  rosuvastatin (CRESTOR) 20 MG tablet, Take 20 mg by mouth daily. , Disp: , Rfl:  .  traZODone (DESYREL) 50 MG tablet, Take 0.5-1 tablets (25-50 mg total) by mouth at bedtime as needed., Disp: 90 tablet, Rfl: 1 .  Turmeric Curcumin 500 MG CAPS, Take 1 capsule by mouth every other day., Disp: , Rfl:  .  vitamin C  (ASCORBIC ACID) 500 MG tablet, Take 500 mg by mouth daily., Disp: , Rfl:  .  vitamin E 100 UNIT capsule, Take 100 Units by mouth daily. , Disp: , Rfl:  .  nicotine polacrilex (NICORETTE) 4 MG gum, Take 4 mg by mouth as needed for smoking cessation. (Patient not taking: Reported on 01/08/2020), Disp: , Rfl:  Medication Side Effects: none  Family Medical/ Social History: Changes? No  MENTAL HEALTH EXAM:  There were no vitals taken for this visit.There is no height or weight on file to calculate BMI.  General Appearance: Casual, Neat and Well Groomed  Eye Contact:  Good  Speech:  Normal Rate and Choppy  Volume:  Normal  Mood:  Euthymic  Affect:  Appropriate  Thought Process:  Goal Directed and Descriptions of Associations: Intact  Orientation:  Full (Time, Place, and Person)  Thought Content: Logical   Suicidal Thoughts:  No  Homicidal Thoughts:  No  Memory:  WNL  Judgement:  Good  Insight:  Good  Psychomotor Activity:  Normal  Concentration:  Concentration: Good and Attention Span: Good  Recall:  Good  Fund of Knowledge: Good  Language: Good  Assets:  Desire for Improvement  ADL's:  Intact  Cognition: WNL  Prognosis:  Good    DIAGNOSES:    ICD-10-CM   1. Insomnia due to other mental disorder  F51.05    F99   2. Smoker  F17.200     Receiving Psychotherapy: No    RECOMMENDATIONS:  PDMP was reviewed. I provided 20 minutes of face-to-face time during this encounter. I am glad to see her doing well! I do not think she ever had bipolar disorder.  I think she was extremely sleep deprived as well as being stressed because of her mother's illness and the emotional toll that takes. Discussed smoking cessation. Continue trazodone 50 mg, 1/2-1 p.o. nightly as needed. Continue melatonin 3 mg, 1/2-1 nightly as needed. Return in 6 months.  Donnal Moat, PA-C   Progress note will be sent to Dr. Lona Kettle her PCP, at her request.

## 2020-01-09 DIAGNOSIS — Z1231 Encounter for screening mammogram for malignant neoplasm of breast: Secondary | ICD-10-CM | POA: Diagnosis not present

## 2020-01-15 ENCOUNTER — Encounter: Payer: Self-pay | Admitting: Cardiology

## 2020-01-15 ENCOUNTER — Other Ambulatory Visit: Payer: Self-pay

## 2020-01-15 ENCOUNTER — Ambulatory Visit: Payer: PPO | Admitting: Cardiology

## 2020-01-15 VITALS — BP 110/60 | HR 64 | Ht 63.0 in | Wt 135.0 lb

## 2020-01-15 DIAGNOSIS — I2510 Atherosclerotic heart disease of native coronary artery without angina pectoris: Secondary | ICD-10-CM

## 2020-01-15 DIAGNOSIS — I700 Atherosclerosis of aorta: Secondary | ICD-10-CM | POA: Diagnosis not present

## 2020-01-15 DIAGNOSIS — R001 Bradycardia, unspecified: Secondary | ICD-10-CM

## 2020-01-15 DIAGNOSIS — Z955 Presence of coronary angioplasty implant and graft: Secondary | ICD-10-CM

## 2020-01-15 NOTE — Progress Notes (Signed)
Cardiology Office Note:    Date:  01/15/2020   ID:  Casey Kirk, DOB [birth year], MRN [id number]  PCP:  Lawerance Cruel, MD  [facility] Cardiologist:  Candee Furbish, MD  [facility] Electrophysiologist:  None   Referring MD: Lawerance Cruel, MD     History of Present Illness:    Casey Kirk is a 74 y.o. female here for follow-up of CAD.  Prior catheterization in 2018 80% mid LAD FFR 0.7, drug-eluting stent x1.  No chest pain fevers chills nausea vomiting syncope.  Has shortness of breath which is likely related to COPD as well.  Continues to smoke.  Other issues include COPD, bipolar disorder,  GERD, IBS, HLD, thyroid disease.  Prior ABIs were normal in 2018. Mild plaque on carotid doppler study.    Past Medical History:  Diagnosis Date  . Allergic rhinitis   . Aortic atherosclerosis ([place]) 08/17/2016  . Arthritis   . ARTHRITIS, HX OF 01/09/2007  . BIPOLAR II DISORDER 03/17/2008  . 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 COPD 12/21/2006  . COPD (chronic obstructive pulmonary disease) ([place]) 12/21/2006   Emphysema on CT 06/2016 02/2016 FEV1 of 98%    . Coronary artery calcification 08/17/2016  . Coronary artery disease involving native coronary artery of native heart with unstable angina pectoris ([place])   . Dysphasia 05/10/2011  . Family history of early CAD 08/17/2016  . GERD (gastroesophageal reflux disease)   . GOUT 01/09/2007  . Hx of colonic polyps   . Hypothyroidism 10/17/2010  . IBS (irritable bowel syndrome)   . Osteoporosis   . Sinusitis   . Thyroid disease   . TINEA CORPORIS 10/06/2009  . Tobacco abuse 05/10/2011    Past Surgical History:  Procedure Laterality Date  . ABDOMINAL HYSTERECTOMY     CA cells removed end of vagina  . BREAST SURGERY     biopsy  . COLONOSCOPY    . CORONARY STENT INTERVENTION N/A 03/16/2017   Procedure: CORONARY STENT INTERVENTION;  Surgeon: Burnell Blanks, MD;  Location: [place] [facility];  Service:  Cardiovascular;  Laterality: N/A;  . EYE SURGERY Right    cataract  . INTRAVASCULAR PRESSURE WIRE/FFR STUDY N/A 03/16/2017   Procedure: INTRAVASCULAR PRESSURE WIRE/FFR STUDY;  Surgeon: Burnell Blanks, MD;  Location: [place] [facility];  Service: Cardiovascular;  Laterality: N/A;  . LEFT HEART CATH AND CORONARY ANGIOGRAPHY N/A 03/16/2017   Procedure: LEFT HEART CATH AND CORONARY ANGIOGRAPHY;  Surgeon: Burnell Blanks, MD;  Location: [place] [facility];  Service: Cardiovascular;  Laterality: N/A;  . LEFT HEART CATH AND CORONARY ANGIOGRAPHY N/A 01/30/2018   Procedure: LEFT HEART CATH AND CORONARY ANGIOGRAPHY;  Surgeon: Burnell Blanks, MD;  Location: [place] [facility];  Service: Cardiovascular;  Laterality: N/A;  . MASS EXCISION Right 05/10/2018   Procedure: EXCISION MASS OF RIGHT PALATAL  WITH LOCAL SOFT TISSUE RECONSTRUCTION;  Surgeon: Jerrell Belfast, MD;  Location: [place];  Service: ENT;  Laterality: Right;  . TOOTH EXTRACTION    . vaginal carcinoma surgery      Current Medications: Current Meds  Medication Sig  . acetaminophen (TYLENOL) 500 MG tablet Take 500 mg by mouth every 8 (eight) hours as needed (pain).  Marland Kitchen albuterol (PROVENTIL HFA;VENTOLIN HFA) 108 (90 Base) MCG/ACT inhaler Inhale 2 puffs into the lungs every 6 (six) hours as needed for wheezing or shortness of breath.  Marland Kitchen aspirin EC 81 MG tablet Take 81 mg by mouth daily.  Marland Kitchen  CALCIUM/MAGNESIUM/ZINC FORMULA PO Take 1 tablet by mouth 3 (three) times a week.  . cholecalciferol (VITAMIN D) 1000 units tablet Take 1,000 Units by mouth daily.  . diphenhydrAMINE (BENADRYL) 25 mg capsule Take 25 mg by mouth daily as needed for allergies.  . diphenoxylate-atropine (LOMOTIL) 2.5-0.025 MG tablet TAKE 2 TABLETS BY MOUTH AS NEEDED 4 TIMES A DAY  . fish oil-omega-3 fatty acids 1000 MG capsule Take 1 g by mouth 2 (two) times a week. qod  . fluticasone (FLONASE) 50 MCG/ACT nasal spray Place 2 sprays into both nostrils daily  as needed for allergies or rhinitis.  . Melatonin 3 MG TABS Take 3 mg by mouth at bedtime.  . Multiple Vitamin (MULTIVITAMIN) tablet Take 1 tablet by mouth daily.    . nicotine polacrilex (NICORETTE) 4 MG gum Take 4 mg by mouth as needed for smoking cessation.   . rosuvastatin (CRESTOR) 20 MG tablet Take 20 mg by mouth daily.   . traZODone (DESYREL) 50 MG tablet Take 0.5-1 tablets (25-50 mg total) by mouth at bedtime as needed.  . Turmeric Curcumin 500 MG CAPS Take 1 capsule by mouth every other day.  . vitamin C (ASCORBIC ACID) 500 MG tablet Take 500 mg by mouth daily.  . vitamin E 100 UNIT capsule Take 100 Units by mouth daily.      Allergies:   Hydrocodone-acetaminophen, Latex, and Pneumococcal polysaccharide vaccine   Social History   Socioeconomic History  . Marital status: Married    Spouse name: Not on file  . Number of children: 2  . Years of education: Not on file  . Highest education level: Not on file  Occupational History  . Occupation: retired  Tobacco Use  . Smoking status: Current Every Day Smoker    Packs/day: 0.40    Years: 40.00    Pack years: 16.00    Types: Cigarettes  . Smokeless tobacco: Never Used  Vaping Use  . Vaping Use: Never used  Substance and Sexual Activity  . Alcohol use: Not Currently    Comment: Rarely   . Drug use: No  . Sexual activity: Yes  Other Topics Concern  . Not on file  Social History Narrative   Lives alone.  She has two grown children.   She is retired from working at [employer].   Highest level of education:  2 year business college   Social Determinants of Health   Financial Resource Strain:   . Difficulty of Paying Living Expenses: Not on file  Food Insecurity:   . Worried About Charity fundraiser in the Last Year: Not on file  . Ran Out of Food in the Last Year: Not on file  Transportation Needs:   . Lack of Transportation (Medical): Not on file  . Lack of Transportation (Non-Medical): Not on file  Physical Activity:     . Days of Exercise per Week: Not on file  . Minutes of Exercise per Session: Not on file  Stress:   . Feeling of Stress : Not on file  Social Connections:   . Frequency of Communication with Friends and Family: Not on file  . Frequency of Social Gatherings with Friends and Family: Not on file  . Attends Religious Services: Not on file  . Active Member of Clubs or Organizations: Not on file  . Attends Archivist Meetings: Not on file  . Marital Status: Not on file     Family History: The patient's family history includes Alcohol abuse in  an other family member; Arthritis in an other family member; COPD in her mother; Cancer in an other family member; Colon polyps in her brother and mother; Diabetes in an other family member; Heart attack (age of onset: 68) in her brother; Heart disease in an other family member; Hyperlipidemia in an other family member; Hypertension in an other family member; Sudden death in an other family member; Tremor in her mother. There is no history of Colon cancer.  ROS:   Please see the history of present illness.     All other systems reviewed and are negative.  EKGs/Labs/Other Studies Reviewed:    The following studies were reviewed today:  Cardiac catheterization 2019 -Normal EF, LAD mid stent.  Nonobstructive disease elsewhere.  Recent Labs: 11/27/2019: BUN 10; Creatinine, Ser 0.75; Hemoglobin 14.1; Magnesium 1.9; Platelets 177; Potassium 4.0; Sodium 144; TSH 2.260  Recent Lipid Panel    Component Value Date/Time   CHOL 107 05/10/2017 0832   TRIG 94 05/10/2017 0832   HDL 51 05/10/2017 0832   CHOLHDL 2.1 05/10/2017 0832   CHOLHDL 5 09/22/2010 0843   VLDL 20.6 09/22/2010 0843   LDLCALC 37 05/10/2017 0832    Physical Exam:    VS:  BP 110/60   Pulse 64   Ht 5\' 3"  (1.6 m)   Wt 135 lb (61.2 kg)   SpO2 97%   BMI 23.91 kg/m     Wt Readings from Last 3 Encounters:  01/15/20 135 lb (61.2 kg)  12/02/19 136 lb 6.4 oz (61.9 kg)  11/27/19  136 lb (61.7 kg)     GEN:  Well nourished, well developed in no acute distress HEENT: Normal NECK: No JVD; No carotid bruits LYMPHATICS: No lymphadenopathy CARDIAC: RRR, no murmurs, rubs, gallops RESPIRATORY:  Clear to auscultation without rales, wheezing or rhonchi  ABDOMEN: Soft, non-tender, non-distended MUSCULOSKELETAL:  No edema; No deformity  SKIN: Warm and dry NEUROLOGIC:  Alert and oriented x 3 PSYCHIATRIC:  Normal affect   ASSESSMENT:    1. Bradycardia   2. Coronary artery disease involving native coronary artery of native heart without angina pectoris   3. Aortic atherosclerosis (HCC)   4. Status post coronary artery stent placement    PLAN:    In order of problems listed above:  Coronary artery disease -LAD stent mid.  No chest pain.  No angina.  Bradycardia -Previously 59.  Has a degree of sick sinus syndrome.  Event monitor from 12/23/2019 reviewed:  Sinus rhythm with average HR of 59 BPM  3.8 second pause at 5:40 AM (presumably sleeping, no symptoms reported)  Frequent PAC's (7%)  73 episodes of SVT (ranging from 4 beats at 170 BPM to 19 seconds at 110 BPM). One diary episode surrounded an episode-fluttering.  No atrial fibrillation   Candee Furbish, MD  Continue with current treatment strategy She states that she does not snore.  Could always consider sleep apnea study but not at this time. -Obviously if she develops syncope or other severe symptoms that could be related to pauses or bradycardia she will let us know.  Pacemaker may be warranted at that time.  Right now, no pacemaker warranted.  Hyperlipidemia -Continue with Crestor 20 mg once a day.  Excellent LDL of 36.  Medication Adjustments/Labs and Tests Ordered: Current medicines are reviewed at length with the patient today.  Concerns regarding medicines are outlined above.  No orders of the defined types were placed in this encounter.  No orders of the defined types were placed  in this  encounter.   Patient Instructions  Medication Instructions:  The current medical regimen is effective;  continue present plan and medications.  *If you need a refill on your cardiac medications before your next appointment, please call your pharmacy*  Follow-Up: At [facility], you and your health needs are our priority.  As part of our continuing mission to provide you with exceptional heart care, we have created designated Provider Care Teams.  These Care Teams include your primary Cardiologist (physician) and Advanced Practice Providers (APPs -  Physician Assistants and Nurse Practitioners) who all work together to provide you with the care you need, when you need it.  We recommend signing up for the patient portal called "MyChart".  Sign up information is provided on this After Visit Summary.  MyChart is used to connect with patients for Virtual Visits (Telemedicine).  Patients are able to view lab/test results, encounter notes, upcoming appointments, etc.  Non-urgent messages can be sent to your provider as well.   To learn more about what you can do with MyChart, go to [email or web address].    Your next appointment:   6 month(s)  The format for your next appointment:   In Person  Provider:   Candee Furbish, MD   Thank you for choosing [facility]!!        Signed, Candee Furbish, MD  01/15/2020 12:28 PM    [place]

## 2020-01-15 NOTE — Patient Instructions (Signed)

## 2020-01-16 ENCOUNTER — Telehealth: Payer: Self-pay | Admitting: *Deleted

## 2020-01-16 NOTE — Telephone Encounter (Signed)
Spoke with husband regarding monitor results.  All questions answered at the time of the call.  He will make [place] aware if she starts having any dizziness/syncope etc.

## 2020-01-16 NOTE — Telephone Encounter (Signed)
i have a question or 2 concerning my wives visit today(Casey Kirk had worn a heart monitor a month or so ago and she was confused about the results.She thought you said her heart  had stopped for 3 minutes once while she slept.Did she hear right and if so does this happen with no negative effects?If you or your nurse could call me for a brief conversation tomorrow I would appreciate [phone]  Thanks  The above can be found in pt's husband's Lynnda Child) chart at a pt advise request-DPR on file to be able to discuss with him.   Results from cardiac telemetry monitoring (Order date 12/23/2019):  Sinus rhythm with average HR of 59 BPM  3.8 second pause at 5:40 AM (presumably sleeping, no symptoms reported)  Frequent PAC's (7%)  73 episodes of SVT (ranging from 4 beats at 170 BPM to 19 seconds at 110 BPM). One diary episode surrounded an episode-fluttering.  No atrial fibrillation   Candee Furbish, MD

## 2020-02-09 DIAGNOSIS — C069 Malignant neoplasm of mouth, unspecified: Secondary | ICD-10-CM | POA: Diagnosis not present

## 2020-02-18 DIAGNOSIS — R0683 Snoring: Secondary | ICD-10-CM | POA: Diagnosis not present

## 2020-03-02 ENCOUNTER — Ambulatory Visit (INDEPENDENT_AMBULATORY_CARE_PROVIDER_SITE_OTHER)
Admission: RE | Admit: 2020-03-02 | Discharge: 2020-03-02 | Disposition: A | Discharge status: Home / Self Care | Payer: PPO | Source: Ambulatory Visit | Attending: Acute Care | Admitting: Acute Care

## 2020-03-02 ENCOUNTER — Other Ambulatory Visit: Payer: Self-pay

## 2020-03-02 DIAGNOSIS — Z87891 Personal history of nicotine dependence: Secondary | ICD-10-CM

## 2020-03-02 DIAGNOSIS — F17210 Nicotine dependence, cigarettes, uncomplicated: Secondary | ICD-10-CM | POA: Diagnosis not present

## 2020-03-02 DIAGNOSIS — Z122 Encounter for screening for malignant neoplasm of respiratory organs: Secondary | ICD-10-CM

## 2020-03-04 DIAGNOSIS — N842 Polyp of vagina: Secondary | ICD-10-CM | POA: Diagnosis not present

## 2020-03-04 DIAGNOSIS — M8580 Other specified disorders of bone density and structure, unspecified site: Secondary | ICD-10-CM | POA: Diagnosis not present

## 2020-03-04 DIAGNOSIS — Z01419 Encounter for gynecological examination (general) (routine) without abnormal findings: Secondary | ICD-10-CM | POA: Diagnosis not present

## 2020-03-04 NOTE — Progress Notes (Signed)
Please call patient and let them  know their  low dose Ct was read as a Lung RADS 2: nodules that are benign in appearance and behavior with a very low likelihood of becoming a clinically active cancer due to size or lack of growth. Recommendation per radiology is for a repeat LDCT in 12 months. .Please let them  know we will order and schedule their  annual screening scan for 02/2021. Please let them  know there was notation of CAD on their  scan.  Please remind the patient  that this is a non-gated exam therefore degree or severity of disease  cannot be determined. Please have them  follow up with their PCP regarding potential risk factor modification, dietary therapy or pharmacologic therapy if clinically indicated. Pt.  is  currently on statin therapy. Please place order for annual  screening scan for  02/2021 and fax results to PCP. Thanks so much. 

## 2020-03-09 ENCOUNTER — Telehealth: Payer: Self-pay | Admitting: Acute Care

## 2020-03-09 DIAGNOSIS — Z87891 Personal history of nicotine dependence: Secondary | ICD-10-CM

## 2020-03-09 DIAGNOSIS — F17210 Nicotine dependence, cigarettes, uncomplicated: Secondary | ICD-10-CM

## 2020-03-11 NOTE — Telephone Encounter (Signed)
ATC home number. Unable to leave message. [place] x 1 on mobile number

## 2020-03-11 NOTE — Telephone Encounter (Signed)
Pt informed of CT results per Sarah Groce, NP.  PT verbalized understanding.  Copy sent to PCP.  Order placed for 1 yr f/u CT.  

## 2020-04-19 DIAGNOSIS — R350 Frequency of micturition: Secondary | ICD-10-CM | POA: Diagnosis not present

## 2020-04-19 DIAGNOSIS — N9089 Other specified noninflammatory disorders of vulva and perineum: Secondary | ICD-10-CM | POA: Diagnosis not present

## 2020-04-19 DIAGNOSIS — N952 Postmenopausal atrophic vaginitis: Secondary | ICD-10-CM | POA: Diagnosis not present

## 2020-05-03 DIAGNOSIS — I10 Essential (primary) hypertension: Secondary | ICD-10-CM | POA: Diagnosis not present

## 2020-05-03 DIAGNOSIS — J449 Chronic obstructive pulmonary disease, unspecified: Secondary | ICD-10-CM | POA: Diagnosis not present

## 2020-05-03 DIAGNOSIS — J42 Unspecified chronic bronchitis: Secondary | ICD-10-CM | POA: Diagnosis not present

## 2020-05-03 DIAGNOSIS — F3181 Bipolar II disorder: Secondary | ICD-10-CM | POA: Diagnosis not present

## 2020-05-03 DIAGNOSIS — I2510 Atherosclerotic heart disease of native coronary artery without angina pectoris: Secondary | ICD-10-CM | POA: Diagnosis not present

## 2020-05-03 DIAGNOSIS — E7800 Pure hypercholesterolemia, unspecified: Secondary | ICD-10-CM | POA: Diagnosis not present

## 2020-05-03 DIAGNOSIS — K219 Gastro-esophageal reflux disease without esophagitis: Secondary | ICD-10-CM | POA: Diagnosis not present

## 2020-05-03 DIAGNOSIS — M810 Age-related osteoporosis without current pathological fracture: Secondary | ICD-10-CM | POA: Diagnosis not present

## 2020-06-01 DIAGNOSIS — N842 Polyp of vagina: Secondary | ICD-10-CM | POA: Diagnosis not present

## 2020-06-01 DIAGNOSIS — L900 Lichen sclerosus et atrophicus: Secondary | ICD-10-CM | POA: Diagnosis not present

## 2020-06-01 DIAGNOSIS — N952 Postmenopausal atrophic vaginitis: Secondary | ICD-10-CM | POA: Diagnosis not present

## 2020-06-22 DIAGNOSIS — L438 Other lichen planus: Secondary | ICD-10-CM | POA: Diagnosis not present

## 2020-06-22 DIAGNOSIS — C069 Malignant neoplasm of mouth, unspecified: Secondary | ICD-10-CM | POA: Diagnosis not present

## 2020-06-24 ENCOUNTER — Other Ambulatory Visit: Payer: Self-pay | Admitting: Physician Assistant

## 2020-07-08 ENCOUNTER — Ambulatory Visit: Payer: PPO | Admitting: Physician Assistant

## 2020-07-20 ENCOUNTER — Encounter: Payer: Self-pay | Admitting: *Deleted

## 2020-07-20 ENCOUNTER — Other Ambulatory Visit: Payer: Self-pay

## 2020-07-20 ENCOUNTER — Ambulatory Visit: Payer: PPO | Admitting: Cardiology

## 2020-07-20 ENCOUNTER — Encounter: Payer: Self-pay | Admitting: Cardiology

## 2020-07-20 VITALS — BP 110/70 | HR 74 | Ht 63.0 in | Wt 139.0 lb

## 2020-07-20 DIAGNOSIS — I2510 Atherosclerotic heart disease of native coronary artery without angina pectoris: Secondary | ICD-10-CM

## 2020-07-20 DIAGNOSIS — R072 Precordial pain: Secondary | ICD-10-CM

## 2020-07-20 NOTE — Patient Instructions (Signed)
Medication Instructions:  The current medical regimen is effective;  continue present plan and medications.  *If you need a refill on your cardiac medications before your next appointment, please call your pharmacy*  Testing/Procedures: Your physician has requested that you have a lexiscan myoview. For further information please visit [email or web address]. Please follow instruction sheet, as given.  Follow-Up: At [facility], you and your health needs are our priority.  As part of our continuing mission to provide you with exceptional heart care, we have created designated Provider Care Teams.  These Care Teams include your primary Cardiologist (physician) and Advanced Practice Providers (APPs -  Physician Assistants and Nurse Practitioners) who all work together to provide you with the care you need, when you need it.  We recommend signing up for the patient portal called "MyChart".  Sign up information is provided on this After Visit Summary.  MyChart is used to connect with patients for Virtual Visits (Telemedicine).  Patients are able to view lab/test results, encounter notes, upcoming appointments, etc.  Non-urgent messages can be sent to your provider as well.   To learn more about what you can do with MyChart, go to [email or web address].    Your next appointment:   6 month(s)  The format for your next appointment:   In Person  Provider:   Candee Furbish, MD   Thank you for choosing [facility]!!

## 2020-07-20 NOTE — Progress Notes (Signed)
Cardiology Office Note:    Date:  07/20/2020   ID:  Casey Kirk, DOB [birth year], MRN [id number]  PCP:  Lawerance Cruel, [place]  Cardiologist:  Candee Furbish, MD  Advanced Practice Provider:  No care team member to display Electrophysiologist:  None       Referring MD: Lawerance Cruel, MD     History of Present Illness:    Casey Kirk is a 75 y.o. female here for the follow-up of coronary artery disease with prior heart catheterization in 2018 showing 80% mid LAD with drug-eluting stent placed x1.  Overall been doing fairly well.  COPD, continues to smoke.  Has bipolar disorder GERD IBS hyperlipidemia.  Prior ABIs were normal in 2018.  Also had mild plaque on carotid Dopplers.  Bradycardia previously noted.  Had a 3.8-second pause at 5:40 AM presumably sleeping with no symptoms reported.  Tired, red palms. Does stairs a lot. Some tightness in chest and throat. Notes when fatigued. Feels it with stairs.   Past Medical History:  Diagnosis Date  . Allergic rhinitis   . Aortic atherosclerosis ([place]) 08/17/2016  . Arthritis   . ARTHRITIS, HX OF 01/09/2007  . BIPOLAR II DISORDER 03/17/2008  . 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 COPD 12/21/2006  . COPD (chronic obstructive pulmonary disease) ([place]) 12/21/2006   Emphysema on CT 06/2016 02/2016 FEV1 of 98%    . Coronary artery calcification 08/17/2016  . Coronary artery disease involving native coronary artery of native heart with unstable angina pectoris ([place])   . Dysphasia 05/10/2011  . Family history of early CAD 08/17/2016  . GERD (gastroesophageal reflux disease)   . GOUT 01/09/2007  . Hx of colonic polyps   . Hypothyroidism 10/17/2010  . IBS (irritable bowel syndrome)   . Osteoporosis   . Sinusitis   . Thyroid disease   . TINEA CORPORIS 10/06/2009  . Tobacco abuse 05/10/2011    Past Surgical History:  Procedure Laterality Date  . ABDOMINAL HYSTERECTOMY     CA cells removed end of vagina   . BREAST SURGERY     biopsy  . COLONOSCOPY    . CORONARY STENT INTERVENTION N/A 03/16/2017   Procedure: CORONARY STENT INTERVENTION;  Surgeon: Burnell Blanks, MD;  Location: [facility];  Service: Cardiovascular;  Laterality: N/A;  . EYE SURGERY Right    cataract  . INTRAVASCULAR PRESSURE WIRE/FFR STUDY N/A 03/16/2017   Procedure: INTRAVASCULAR PRESSURE WIRE/FFR STUDY;  Surgeon: Burnell Blanks, MD;  Location: [facility];  Service: Cardiovascular;  Laterality: N/A;  . LEFT HEART CATH AND CORONARY ANGIOGRAPHY N/A 03/16/2017   Procedure: LEFT HEART CATH AND CORONARY ANGIOGRAPHY;  Surgeon: Burnell Blanks, MD;  Location: [facility];  Service: Cardiovascular;  Laterality: N/A;  . LEFT HEART CATH AND CORONARY ANGIOGRAPHY N/A 01/30/2018   Procedure: LEFT HEART CATH AND CORONARY ANGIOGRAPHY;  Surgeon: Burnell Blanks, MD;  Location: [facility];  Service: Cardiovascular;  Laterality: N/A;  . MASS EXCISION Right 05/10/2018   Procedure: EXCISION MASS OF RIGHT PALATAL  WITH LOCAL SOFT TISSUE RECONSTRUCTION;  Surgeon: Jerrell Belfast, MD;  Location: [facility];  Service: ENT;  Laterality: Right;  . TOOTH EXTRACTION    . vaginal carcinoma surgery      Current Medications: Current Meds  Medication Sig  . acetaminophen (TYLENOL) 500 MG tablet Take 500 mg by mouth every 8 (eight) hours as needed (pain).  Marland Kitchen  albuterol (PROVENTIL HFA;VENTOLIN HFA) 108 (90 Base) MCG/ACT inhaler Inhale 2 puffs into the lungs every 6 (six) hours as needed for wheezing or shortness of breath.  Marland Kitchen aspirin EC 81 MG tablet Take 81 mg by mouth daily.  Marland Kitchen CALCIUM/MAGNESIUM/ZINC FORMULA PO Take 1 tablet by mouth 3 (three) times a week.  . cholecalciferol (VITAMIN D) 1000 units tablet Take 1,000 Units by mouth daily.  . diphenhydrAMINE (BENADRYL) 25 mg capsule Take 25 mg by mouth daily as needed for allergies.  . diphenoxylate-atropine (LOMOTIL) 2.5-0.025 MG tablet TAKE 2  TABLETS BY MOUTH AS NEEDED 4 TIMES A DAY  . fish oil-omega-3 fatty acids 1000 MG capsule Take 1 g by mouth 2 (two) times a week. qod  . fluticasone (FLONASE) 50 MCG/ACT nasal spray Place 2 sprays into both nostrils daily as needed for allergies or rhinitis.  . Melatonin 3 MG TABS Take 3 mg by mouth at bedtime.  . Multiple Vitamin (MULTIVITAMIN) tablet Take 1 tablet by mouth daily.  . nicotine polacrilex (NICORETTE) 4 MG gum Take 4 mg by mouth as needed for smoking cessation.   . rosuvastatin (CRESTOR) 20 MG tablet Take 20 mg by mouth daily.   . traZODone (DESYREL) 50 MG tablet TAKE 0.5-1 TABLETS (25-50 MG TOTAL) BY MOUTH AT BEDTIME AS NEEDED.  . Turmeric Curcumin 500 MG CAPS Take 1 capsule by mouth every other day.  . vitamin C (ASCORBIC ACID) 500 MG tablet Take 500 mg by mouth daily.  . vitamin E 100 UNIT capsule Take 100 Units by mouth daily.      Allergies:   Hydrocodone-acetaminophen, Latex, and Pneumococcal polysaccharide vaccine   Social History   Socioeconomic History  . Marital status: Married    Spouse name: Not on file  . Number of children: 2  . Years of education: Not on file  . Highest education level: Not on file  Occupational History  . Occupation: retired  Tobacco Use  . Smoking status: Current Every Day Smoker    Packs/day: 0.40    Years: 40.00    Pack years: 16.00    Types: Cigarettes  . Smokeless tobacco: Never Used  Vaping Use  . Vaping Use: Never used  Substance and Sexual Activity  . Alcohol use: Not Currently    Comment: Rarely   . Drug use: No  . Sexual activity: Yes  Other Topics Concern  . Not on file  Social History Narrative   Lives alone.  She has two grown children.   She is retired from working at [employer].   Highest level of education:  2 year business college   Social Determinants of Radio broadcast assistant Strain: Not on file  Food Insecurity: Not on file  Transportation Needs: Not on file  Physical Activity: Not on file  Stress:  Not on file  Social Connections: Not on file     Family History: The patient's family history includes Alcohol abuse in an other family member; Arthritis in an other family member; COPD in her mother; Cancer in an other family member; Colon polyps in her brother and mother; Diabetes in an other family member; Heart attack (age of onset: 14) in her brother; Heart disease in an other family member; Hyperlipidemia in an other family member; Hypertension in an other family member; Sudden death in an other family member; Tremor in her mother. There is no history of Colon cancer.  ROS:   Please see the history of present illness.     All  other systems reviewed and are negative.  EKGs/Labs/Other Studies Reviewed:    The following studies were reviewed today:  NUC stress 2018:  Nuclear stress EF: 71%.  Blood pressure demonstrated a normal response to exercise.  Upsloping ST segment depression ST segment depression of 1 mm was noted during stress in the II, III, aVF, V5 and V6 leads.  Findings consistent with prior myocardial infarction.  This is a low risk study.  The left ventricular ejection fraction is hyperdynamic (>65%).   Small inferoapical wall infarction  EF is normal 71% Extracardiac activity in this region makes specificity of finding lower Patient only achieved 87% PMHR ECG has 1 mm ST segment depression In recovery    Event monitor from 12/23/2019 reviewed:  Sinus rhythm with average HR of 59 BPM  3.8 second pause at 5:40 AM (presumably sleeping, no symptoms reported)  Frequent PAC's (7%)  73 episodes of SVT (ranging from 4 beats at 170 BPM to 19 seconds at 110 BPM). One diary episode surrounded an episode-fluttering.  No atrial fibrillation   EKG: Prior EKG showed sinus with PACs on 12/02/2019  Recent Labs: 11/27/2019: BUN 10; Creatinine, Ser 0.75; Hemoglobin 14.1; Magnesium 1.9; Platelets 177; Potassium 4.0; Sodium 144; TSH 2.260  Recent Lipid Panel     Component Value Date/Time   CHOL 107 05/10/2017 0832   TRIG 94 05/10/2017 0832   HDL 51 05/10/2017 0832   CHOLHDL 2.1 05/10/2017 0832   CHOLHDL 5 09/22/2010 0843   VLDL 20.6 09/22/2010 0843   LDLCALC 37 05/10/2017 0832     Risk Assessment/Calculations:      Physical Exam:    VS:  BP 110/70 (BP Location: Left Arm, Patient Position: Sitting, Cuff Size: Normal)   Pulse 74   Ht 5\' 3"  (1.6 m)   Wt 139 lb (63 kg)   SpO2 95%   BMI 24.62 kg/m     Wt Readings from Last 3 Encounters:  07/20/20 139 lb (63 kg)  01/15/20 135 lb (61.2 kg)  12/02/19 136 lb 6.4 oz (61.9 kg)     GEN:  Well nourished, well developed in no acute distress HEENT: Normal NECK: No JVD; No carotid bruits LYMPHATICS: No lymphadenopathy CARDIAC: RRR, no murmurs, rubs, gallops RESPIRATORY:  Clear to auscultation without rales, wheezing or rhonchi  ABDOMEN: Soft, non-tender, non-distended MUSCULOSKELETAL:  No edema; No deformity  SKIN: Warm and dry NEUROLOGIC:  Alert and oriented x 3 PSYCHIATRIC:  Normal affect   ASSESSMENT:    1. Coronary artery disease involving native coronary artery of native heart without angina pectoris   2. Precordial pain    PLAN:    In order of problems listed above:  Chest pain -With her known coronary disease and some chest tightness that she is noted when going up the stairs, we will go ahead and check a nuclear stress test since it has been about 4 years.  Looking for any high risk features.  If high risk, consider proceeding with cardiac catheterization.  Fatigue -Certainly could be related to COPD, deconditioning.  Continue to encourage exercise.  Smoking cessation.  Hyperlipidemia -On Crestor 20 a day excellent LDL prior 36.  Sinus pause -3.8 seconds at 5:40 AM when sleeping no symptoms reported.  Continue to monitor for any signs of syncope.  No atrial fibrillation noted.  No need for pacemaker at this time.  Risks and benefits of stress test  reviewed.  Medication Adjustments/Labs and Tests Ordered: Current medicines are reviewed at length with the patient today.  Concerns regarding medicines are outlined above.  Orders Placed This Encounter  Procedures  . MYOCARDIAL PERFUSION IMAGING   No orders of the defined types were placed in this encounter.   Patient Instructions  Medication Instructions:  The current medical regimen is effective;  continue present plan and medications.  *If you need a refill on your cardiac medications before your next appointment, please call your pharmacy*  Testing/Procedures: Your physician has requested that you have a lexiscan myoview. For further information please visit [email or web address]. Please follow instruction sheet, as given.  Follow-Up: At [facility], you and your health needs are our priority.  As part of our continuing mission to provide you with exceptional heart care, we have created designated Provider Care Teams.  These Care Teams include your primary Cardiologist (physician) and Advanced Practice Providers (APPs -  Physician Assistants and Nurse Practitioners) who all work together to provide you with the care you need, when you need it.  We recommend signing up for the patient portal called "MyChart".  Sign up information is provided on this After Visit Summary.  MyChart is used to connect with patients for Virtual Visits (Telemedicine).  Patients are able to view lab/test results, encounter notes, upcoming appointments, etc.  Non-urgent messages can be sent to your provider as well.   To learn more about what you can do with MyChart, go to [email or web address].    Your next appointment:   6 month(s)  The format for your next appointment:   In Person  Provider:   Candee Furbish, MD   Thank you for choosing [facility]!!        Signed, Candee Furbish, MD  07/20/2020 12:31 PM    [place]

## 2020-07-22 ENCOUNTER — Telehealth (HOSPITAL_COMMUNITY): Payer: Self-pay | Admitting: *Deleted

## 2020-07-22 NOTE — Telephone Encounter (Signed)
Patient given detailed instructions per Myocardial Perfusion Study Information Sheet for the test on 07/28/20. Patient notified to arrive 15 minutes early and that it is imperative to arrive on time for appointment to keep from having the test rescheduled.  If you need to cancel or reschedule your appointment, please call the office within 24 hours of your appointment. . Patient verbalized understanding. Caren Garske Jacqueline    

## 2020-07-28 ENCOUNTER — Ambulatory Visit (HOSPITAL_COMMUNITY): Payer: PPO | Attending: Cardiovascular Disease

## 2020-07-28 ENCOUNTER — Other Ambulatory Visit: Payer: Self-pay

## 2020-07-28 DIAGNOSIS — R072 Precordial pain: Secondary | ICD-10-CM | POA: Diagnosis not present

## 2020-07-28 LAB — MYOCARDIAL PERFUSION IMAGING
LV dias vol: 58 mL (ref 46–106)
LV sys vol: 15 mL
Peak HR: 93 {beats}/min
Rest HR: 66 {beats}/min
SDS: 1
SRS: 0
SSS: 1
TID: 1

## 2020-07-28 MED ORDER — TECHNETIUM TC 99M TETROFOSMIN IV KIT
30.9000 | PACK | Freq: Once | INTRAVENOUS | Status: AC | PRN
  Administered 2020-07-28: 30.9 via INTRAVENOUS
  Filled 2020-07-28: qty 31

## 2020-07-28 MED ORDER — TECHNETIUM TC 99M TETROFOSMIN IV KIT
10.2000 | PACK | Freq: Once | INTRAVENOUS | Status: AC | PRN
  Administered 2020-07-28: 10.2 via INTRAVENOUS
  Filled 2020-07-28: qty 11

## 2020-07-28 MED ORDER — REGADENOSON 0.4 MG/5ML IV SOLN
0.4000 mg | Freq: Once | INTRAVENOUS | Status: AC
  Administered 2020-07-28: 0.4 mg via INTRAVENOUS

## 2020-08-03 DIAGNOSIS — K52831 Collagenous colitis: Secondary | ICD-10-CM | POA: Diagnosis not present

## 2020-08-03 DIAGNOSIS — Z86010 Personal history of colonic polyps: Secondary | ICD-10-CM | POA: Diagnosis not present

## 2020-08-12 DIAGNOSIS — L900 Lichen sclerosus et atrophicus: Secondary | ICD-10-CM | POA: Diagnosis not present

## 2020-08-12 DIAGNOSIS — Z6824 Body mass index (BMI) 24.0-24.9, adult: Secondary | ICD-10-CM | POA: Diagnosis not present

## 2020-08-12 DIAGNOSIS — Z01419 Encounter for gynecological examination (general) (routine) without abnormal findings: Secondary | ICD-10-CM | POA: Diagnosis not present

## 2020-08-12 DIAGNOSIS — Z124 Encounter for screening for malignant neoplasm of cervix: Secondary | ICD-10-CM | POA: Diagnosis not present

## 2020-08-12 DIAGNOSIS — N950 Postmenopausal bleeding: Secondary | ICD-10-CM | POA: Diagnosis not present

## 2020-08-26 ENCOUNTER — Encounter: Payer: Self-pay | Admitting: Physician Assistant

## 2020-08-26 ENCOUNTER — Ambulatory Visit (INDEPENDENT_AMBULATORY_CARE_PROVIDER_SITE_OTHER): Payer: PPO | Admitting: Physician Assistant

## 2020-08-26 ENCOUNTER — Other Ambulatory Visit: Payer: Self-pay

## 2020-08-26 DIAGNOSIS — F99 Mental disorder, not otherwise specified: Secondary | ICD-10-CM | POA: Diagnosis not present

## 2020-08-26 DIAGNOSIS — F5105 Insomnia due to other mental disorder: Secondary | ICD-10-CM

## 2020-08-26 DIAGNOSIS — F17200 Nicotine dependence, unspecified, uncomplicated: Secondary | ICD-10-CM

## 2020-08-26 NOTE — Progress Notes (Signed)
[facility] Med Check  Patient ID: Casey Kirk,  MRN: [id number]  PCP: Lawerance Cruel, MD  Date of Evaluation: 08/26/2020 Time spent:20 minutes  Chief Complaint:  Chief Complaint    Follow-up      HISTORY/CURRENT STATUS: HPI For routine med check.  Doing really well.  Trazodone works well.  She has been on it for several years now and gets really good sleep.  Patient denies loss of interest in usual activities and is able to enjoy things.  Denies decreased energy or motivation.  Appetite has not changed.  No extreme sadness, tearfulness, or feelings of hopelessness.  Denies any changes in concentration, making decisions or remembering things.  No anxiety.  Still smoking and would love to quit but it is very difficult.  Denies suicidal or homicidal thoughts.  Denies dizziness, syncope, seizures, numbness, tingling, tremor, tics, unsteady gait, slurred speech, confusion. Denies muscle or joint pain, stiffness, or dystonia.  Individual Medical History/ Review of Systems: Changes? :Yes  see chart   Past medications for mental health diagnoses include: Lamictal, Trazodone  Allergies: Hydrocodone-acetaminophen, Latex, and Pneumococcal polysaccharide vaccine  Current Medications:  Current Outpatient Medications:  .  acetaminophen (TYLENOL) 500 MG tablet, Take 500 mg by mouth every 8 (eight) hours as needed (pain)., Disp: , Rfl:  .  albuterol (PROVENTIL HFA;VENTOLIN HFA) 108 (90 Base) MCG/ACT inhaler, Inhale 2 puffs into the lungs every 6 (six) hours as needed for wheezing or shortness of breath., Disp: 1 Inhaler, Rfl: 5 .  aspirin EC 81 MG tablet, Take 81 mg by mouth daily., Disp: , Rfl:  .  CALCIUM/MAGNESIUM/ZINC FORMULA PO, Take 1 tablet by mouth 3 (three) times a week., Disp: , Rfl:  .  cholecalciferol (VITAMIN D) 1000 units tablet, Take 1,000 Units by mouth daily., Disp: , Rfl:  .  diphenhydrAMINE (BENADRYL) 25 mg capsule, Take 25 mg by mouth daily as needed for  allergies., Disp: , Rfl:  .  diphenoxylate-atropine (LOMOTIL) 2.5-0.025 MG tablet, TAKE 2 TABLETS BY MOUTH AS NEEDED 4 TIMES A DAY, Disp: , Rfl:  .  fish oil-omega-3 fatty acids 1000 MG capsule, Take 1 g by mouth 2 (two) times a week. qod, Disp: , Rfl:  .  fluticasone (FLONASE) 50 MCG/ACT nasal spray, Place 2 sprays into both nostrils daily as needed for allergies or rhinitis., Disp: , Rfl:  .  Melatonin 3 MG TABS, Take 3 mg by mouth at bedtime., Disp: , Rfl:  .  Multiple Vitamin (MULTIVITAMIN) tablet, Take 1 tablet by mouth daily., Disp: , Rfl:  .  rosuvastatin (CRESTOR) 20 MG tablet, Take 20 mg by mouth daily. , Disp: , Rfl:  .  traZODone (DESYREL) 50 MG tablet, TAKE 0.5-1 TABLETS (25-50 MG TOTAL) BY MOUTH AT BEDTIME AS NEEDED., Disp: 90 tablet, Rfl: 1 .  Turmeric Curcumin 500 MG CAPS, Take 1 capsule by mouth every other day., Disp: , Rfl:  .  vitamin C (ASCORBIC ACID) 500 MG tablet, Take 500 mg by mouth daily., Disp: , Rfl:  .  vitamin E 100 UNIT capsule, Take 100 Units by mouth daily. , Disp: , Rfl:  .  nicotine polacrilex (NICORETTE) 4 MG gum, Take 4 mg by mouth as needed for smoking cessation.  (Patient not taking: Reported on 08/26/2020), Disp: , Rfl:  Medication Side Effects: none  Family Medical/ Social History: Changes? no  MENTAL HEALTH EXAM:  There were no vitals taken for this visit.There is no height or weight on file to calculate BMI.  General  Appearance: Casual, Neat and Well Groomed  Eye Contact:  Good  Speech:  Clear and Coherent, Normal Rate and tremulous  Volume:  Normal  Mood:  Euthymic  Affect:  Appropriate  Thought Process:  Goal Directed and Descriptions of Associations: Intact  Orientation:  Full (Time, Place, and Person)  Thought Content: Logical   Suicidal Thoughts:  No  Homicidal Thoughts:  No  Memory:  WNL  Judgement:  Good  Insight:  Good  Psychomotor Activity:  Normal  Concentration:  Concentration: Good  Recall:  Good  Fund of Knowledge: Good   Language: Good  Assets:  Desire for Improvement  ADL's:  Intact  Cognition: WNL  Prognosis:  Good    DIAGNOSES:    ICD-10-CM   1. Insomnia due to other mental disorder  F51.05    F99   2. Smoker  F17.200     Receiving Psychotherapy: No    RECOMMENDATIONS:  PDMP was reviewed. I provided 20 minutes of face-to-face time during this encounter, including time spent in chart review and documentation. She is doing really well so no changes need to be made. Brief counseling on smoking cessation. Continue trazodone 50 mg, 1/2-1 p.o. nightly as needed. Continue melatonin 3 mg 1 p.o. nightly as needed. Return in 1 year.  Donnal Moat, PA-C

## 2020-08-30 DIAGNOSIS — L900 Lichen sclerosus et atrophicus: Secondary | ICD-10-CM | POA: Diagnosis not present

## 2020-08-30 DIAGNOSIS — Z01411 Encounter for gynecological examination (general) (routine) with abnormal findings: Secondary | ICD-10-CM | POA: Diagnosis not present

## 2020-08-30 DIAGNOSIS — R87619 Unspecified abnormal cytological findings in specimens from cervix uteri: Secondary | ICD-10-CM | POA: Diagnosis not present

## 2020-08-30 DIAGNOSIS — N760 Acute vaginitis: Secondary | ICD-10-CM | POA: Diagnosis not present

## 2020-08-31 DIAGNOSIS — H04123 Dry eye syndrome of bilateral lacrimal glands: Secondary | ICD-10-CM | POA: Diagnosis not present

## 2020-08-31 DIAGNOSIS — H43813 Vitreous degeneration, bilateral: Secondary | ICD-10-CM | POA: Diagnosis not present

## 2020-08-31 DIAGNOSIS — H26493 Other secondary cataract, bilateral: Secondary | ICD-10-CM | POA: Diagnosis not present

## 2020-08-31 DIAGNOSIS — Z961 Presence of intraocular lens: Secondary | ICD-10-CM | POA: Diagnosis not present

## 2020-09-02 DIAGNOSIS — N958 Other specified menopausal and perimenopausal disorders: Secondary | ICD-10-CM | POA: Diagnosis not present

## 2020-09-02 DIAGNOSIS — M816 Localized osteoporosis [Lequesne]: Secondary | ICD-10-CM | POA: Diagnosis not present

## 2020-09-03 DIAGNOSIS — E7800 Pure hypercholesterolemia, unspecified: Secondary | ICD-10-CM | POA: Diagnosis not present

## 2020-09-03 DIAGNOSIS — J449 Chronic obstructive pulmonary disease, unspecified: Secondary | ICD-10-CM | POA: Diagnosis not present

## 2020-09-03 DIAGNOSIS — I10 Essential (primary) hypertension: Secondary | ICD-10-CM | POA: Diagnosis not present

## 2020-09-03 DIAGNOSIS — M810 Age-related osteoporosis without current pathological fracture: Secondary | ICD-10-CM | POA: Diagnosis not present

## 2020-09-03 DIAGNOSIS — J42 Unspecified chronic bronchitis: Secondary | ICD-10-CM | POA: Diagnosis not present

## 2020-09-03 DIAGNOSIS — K219 Gastro-esophageal reflux disease without esophagitis: Secondary | ICD-10-CM | POA: Diagnosis not present

## 2020-09-03 DIAGNOSIS — F3181 Bipolar II disorder: Secondary | ICD-10-CM | POA: Diagnosis not present

## 2020-09-03 DIAGNOSIS — I2510 Atherosclerotic heart disease of native coronary artery without angina pectoris: Secondary | ICD-10-CM | POA: Diagnosis not present

## 2020-09-08 DIAGNOSIS — L438 Other lichen planus: Secondary | ICD-10-CM | POA: Diagnosis not present

## 2020-09-08 DIAGNOSIS — F17200 Nicotine dependence, unspecified, uncomplicated: Secondary | ICD-10-CM | POA: Diagnosis not present

## 2020-10-04 DIAGNOSIS — E7800 Pure hypercholesterolemia, unspecified: Secondary | ICD-10-CM | POA: Diagnosis not present

## 2020-10-04 DIAGNOSIS — I700 Atherosclerosis of aorta: Secondary | ICD-10-CM | POA: Diagnosis not present

## 2020-10-04 DIAGNOSIS — E559 Vitamin D deficiency, unspecified: Secondary | ICD-10-CM | POA: Diagnosis not present

## 2020-10-04 DIAGNOSIS — I10 Essential (primary) hypertension: Secondary | ICD-10-CM | POA: Diagnosis not present

## 2020-10-04 DIAGNOSIS — M810 Age-related osteoporosis without current pathological fracture: Secondary | ICD-10-CM | POA: Diagnosis not present

## 2020-10-12 DIAGNOSIS — I10 Essential (primary) hypertension: Secondary | ICD-10-CM | POA: Diagnosis not present

## 2020-10-12 DIAGNOSIS — L578 Other skin changes due to chronic exposure to nonionizing radiation: Secondary | ICD-10-CM | POA: Diagnosis not present

## 2020-10-12 DIAGNOSIS — Z23 Encounter for immunization: Secondary | ICD-10-CM | POA: Diagnosis not present

## 2020-10-12 DIAGNOSIS — Z0000 Encounter for general adult medical examination without abnormal findings: Secondary | ICD-10-CM | POA: Diagnosis not present

## 2020-10-12 DIAGNOSIS — Z85819 Personal history of malignant neoplasm of unspecified site of lip, oral cavity, and pharynx: Secondary | ICD-10-CM | POA: Diagnosis not present

## 2020-10-12 DIAGNOSIS — E7800 Pure hypercholesterolemia, unspecified: Secondary | ICD-10-CM | POA: Diagnosis not present

## 2020-10-12 DIAGNOSIS — E559 Vitamin D deficiency, unspecified: Secondary | ICD-10-CM | POA: Diagnosis not present

## 2020-10-12 DIAGNOSIS — Z79899 Other long term (current) drug therapy: Secondary | ICD-10-CM | POA: Diagnosis not present

## 2020-10-12 DIAGNOSIS — I779 Disorder of arteries and arterioles, unspecified: Secondary | ICD-10-CM | POA: Diagnosis not present

## 2020-10-12 DIAGNOSIS — I700 Atherosclerosis of aorta: Secondary | ICD-10-CM | POA: Diagnosis not present

## 2020-10-12 DIAGNOSIS — J449 Chronic obstructive pulmonary disease, unspecified: Secondary | ICD-10-CM | POA: Diagnosis not present

## 2020-10-12 DIAGNOSIS — M810 Age-related osteoporosis without current pathological fracture: Secondary | ICD-10-CM | POA: Diagnosis not present

## 2020-10-13 DIAGNOSIS — N952 Postmenopausal atrophic vaginitis: Secondary | ICD-10-CM | POA: Diagnosis not present

## 2020-10-13 DIAGNOSIS — N898 Other specified noninflammatory disorders of vagina: Secondary | ICD-10-CM | POA: Diagnosis not present

## 2020-10-13 DIAGNOSIS — M810 Age-related osteoporosis without current pathological fracture: Secondary | ICD-10-CM | POA: Diagnosis not present

## 2020-10-20 ENCOUNTER — Ambulatory Visit
Admission: RE | Admit: 2020-10-20 | Discharge: 2020-10-20 | Disposition: A | Discharge status: Home / Self Care | Payer: PPO | Source: Ambulatory Visit | Attending: Home Modifications | Admitting: Home Modifications

## 2020-10-20 ENCOUNTER — Other Ambulatory Visit: Payer: Self-pay

## 2020-10-20 ENCOUNTER — Other Ambulatory Visit: Payer: Self-pay | Admitting: Home Modifications

## 2020-10-20 DIAGNOSIS — R0781 Pleurodynia: Secondary | ICD-10-CM | POA: Diagnosis not present

## 2020-10-20 DIAGNOSIS — Z6824 Body mass index (BMI) 24.0-24.9, adult: Secondary | ICD-10-CM | POA: Diagnosis not present

## 2020-10-21 DIAGNOSIS — R221 Localized swelling, mass and lump, neck: Secondary | ICD-10-CM | POA: Diagnosis not present

## 2020-10-21 DIAGNOSIS — L438 Other lichen planus: Secondary | ICD-10-CM | POA: Diagnosis not present

## 2020-10-21 DIAGNOSIS — R030 Elevated blood-pressure reading, without diagnosis of hypertension: Secondary | ICD-10-CM | POA: Diagnosis not present

## 2020-11-02 DIAGNOSIS — L570 Actinic keratosis: Secondary | ICD-10-CM | POA: Diagnosis not present

## 2020-11-02 DIAGNOSIS — L821 Other seborrheic keratosis: Secondary | ICD-10-CM | POA: Diagnosis not present

## 2020-11-02 DIAGNOSIS — D1801 Hemangioma of skin and subcutaneous tissue: Secondary | ICD-10-CM | POA: Diagnosis not present

## 2020-11-09 DIAGNOSIS — E7800 Pure hypercholesterolemia, unspecified: Secondary | ICD-10-CM | POA: Diagnosis not present

## 2020-11-09 DIAGNOSIS — M810 Age-related osteoporosis without current pathological fracture: Secondary | ICD-10-CM | POA: Diagnosis not present

## 2020-11-09 DIAGNOSIS — K219 Gastro-esophageal reflux disease without esophagitis: Secondary | ICD-10-CM | POA: Diagnosis not present

## 2020-11-09 DIAGNOSIS — J449 Chronic obstructive pulmonary disease, unspecified: Secondary | ICD-10-CM | POA: Diagnosis not present

## 2020-11-09 DIAGNOSIS — F3181 Bipolar II disorder: Secondary | ICD-10-CM | POA: Diagnosis not present

## 2020-11-09 DIAGNOSIS — J42 Unspecified chronic bronchitis: Secondary | ICD-10-CM | POA: Diagnosis not present

## 2020-11-09 DIAGNOSIS — I2510 Atherosclerotic heart disease of native coronary artery without angina pectoris: Secondary | ICD-10-CM | POA: Diagnosis not present

## 2020-11-09 DIAGNOSIS — I10 Essential (primary) hypertension: Secondary | ICD-10-CM | POA: Diagnosis not present

## 2020-12-22 ENCOUNTER — Other Ambulatory Visit: Payer: Self-pay | Admitting: Physician Assistant

## 2021-01-18 DIAGNOSIS — M26621 Arthralgia of right temporomandibular joint: Secondary | ICD-10-CM | POA: Diagnosis not present

## 2021-01-18 DIAGNOSIS — C069 Malignant neoplasm of mouth, unspecified: Secondary | ICD-10-CM | POA: Diagnosis not present

## 2021-01-18 DIAGNOSIS — L438 Other lichen planus: Secondary | ICD-10-CM | POA: Diagnosis not present

## 2021-01-18 NOTE — Progress Notes (Incomplete)
Cardiology Office Note:    Date:  01/18/2021   ID:  Casey Kirk, DOB 1946/02/11, MRN [id number]  PCP:  Lawerance Cruel, [place]  Cardiologist:  Candee Furbish, MD  Advanced Practice Provider:  No care team member to display Electrophysiologist:  None       Referring MD: Lawerance Cruel, MD     History of Present Illness:    Casey Kirk is a 75 y.o. female here for the follow-up of coronary artery disease with prior heart catheterization in 2018 showing 80% mid LAD with drug-eluting stent placed x1.   Overall been doing fairly well.  COPD, continues to smoke.  Has bipolar disorder GERD IBS hyperlipidemia.  Prior ABIs were normal in 2018.  Also had mild plaque on carotid Dopplers.  Bradycardia previously noted.  Had a 3.8-second pause at 5:40 AM presumably sleeping with no symptoms reported.  Tired, red palms. Does stairs a lot. Some tightness in chest and throat. Notes when fatigued. Feels it with stairs.    Today,  Past Medical History:  Diagnosis Date   Allergic rhinitis    Aortic atherosclerosis ([place]) 08/17/2016   Arthritis    ARTHRITIS, HX OF 01/09/2007   BIPOLAR II DISORDER 03/17/2008   Carcinoma of vaginal vault (HCC)    COPD 12/21/2006   COPD (chronic obstructive pulmonary disease) ([place]) 12/21/2006   Emphysema on CT 06/2016 02/2016 FEV1 of 98%     Coronary artery calcification 08/17/2016   Coronary artery disease involving native coronary artery of native heart with unstable angina pectoris ([place])    Dysphasia 05/10/2011   Family history of early CAD 08/17/2016   GERD (gastroesophageal reflux disease)    GOUT 01/09/2007   Hx of colonic polyps    Hypothyroidism 10/17/2010   IBS (irritable bowel syndrome)    Osteoporosis    Sinusitis    Thyroid disease    TINEA CORPORIS 10/06/2009   Tobacco abuse 05/10/2011    Past Surgical History:  Procedure Laterality Date   ABDOMINAL HYSTERECTOMY     CA cells removed end of vagina    BREAST SURGERY     biopsy   COLONOSCOPY     CORONARY STENT INTERVENTION N/A 03/16/2017   Procedure: CORONARY STENT INTERVENTION;  Surgeon: Burnell Blanks, MD;  Location: [facility];  Service: Cardiovascular;  Laterality: N/A;   EYE SURGERY Right    cataract   INTRAVASCULAR PRESSURE WIRE/FFR STUDY N/A 03/16/2017   Procedure: INTRAVASCULAR PRESSURE WIRE/FFR STUDY;  Surgeon: Burnell Blanks, MD;  Location: [facility];  Service: Cardiovascular;  Laterality: N/A;   LEFT HEART CATH AND CORONARY ANGIOGRAPHY N/A 03/16/2017   Procedure: LEFT HEART CATH AND CORONARY ANGIOGRAPHY;  Surgeon: Burnell Blanks, MD;  Location: [facility];  Service: Cardiovascular;  Laterality: N/A;   LEFT HEART CATH AND CORONARY ANGIOGRAPHY N/A 01/30/2018   Procedure: LEFT HEART CATH AND CORONARY ANGIOGRAPHY;  Surgeon: Burnell Blanks, MD;  Location: [facility];  Service: Cardiovascular;  Laterality: N/A;   MASS EXCISION Right 05/10/2018   Procedure: EXCISION MASS OF RIGHT PALATAL  WITH LOCAL SOFT TISSUE RECONSTRUCTION;  Surgeon: Jerrell Belfast, MD;  Location: [facility];  Service: ENT;  Laterality: Right;   TOOTH EXTRACTION     vaginal carcinoma surgery      Current Medications: No outpatient medications have been marked as taking for the 01/20/21 encounter (Appointment) with Jerline Pain, MD.  Allergies:   Hydrocodone-acetaminophen, Latex, and Pneumococcal polysaccharide vaccine   Social History   Socioeconomic History   Marital status: Married    Spouse name: Not on file   Number of children: 2   Years of education: Not on file   Highest education level: Not on file  Occupational History   Occupation: retired  Tobacco Use   Smoking status: Every Day    Packs/day: 0.40    Years: 40.00    Pack years: 16.00    Types: Cigarettes   Smokeless tobacco: Never  Vaping Use   Vaping Use: Never used  Substance and Sexual Activity   Alcohol use: Not  Currently    Comment: Rarely    Drug use: No   Sexual activity: Yes  Other Topics Concern   Not on file  Social History Narrative   Lives alone.  She has two grown children.   She is retired from working at [employer].   Highest level of education:  2 year business college   Social Determinants of Radio broadcast assistant Strain: Not on file  Food Insecurity: Not on file  Transportation Needs: Not on file  Physical Activity: Not on file  Stress: Not on file  Social Connections: Not on file     Family History: The patient's family history includes Alcohol abuse in an other family member; Arthritis in an other family member; COPD in her mother; Cancer in an other family member; Colon polyps in her brother and mother; Diabetes in an other family member; Heart attack (age of onset: 25) in her brother; Heart disease in an other family member; Hyperlipidemia in an other family member; Hypertension in an other family member; Sudden death in an other family member; Tremor in her mother. There is no history of Colon cancer.  ROS:   Please see the history of present illness.    (+) All other systems reviewed and are negative.  EKGs/Labs/Other Studies Reviewed:    The following studies were reviewed today:  Lexiscan Myoview 07/28/2020: Nuclear stress EF: 74%. The left ventricular ejection fraction is hyperdynamic (>65%). There was no ST segment deviation noted during stress. This is a low risk study. There is no evidence of ischemia and no evidence of previous infarction. The study is normal.  Event monitor from 12/23/2019 reviewed: Sinus rhythm with average HR of 59 BPM 3.8 second pause at 5:40 AM (presumably sleeping, no symptoms reported) Frequent PAC's (7%) 73 episodes of SVT (ranging from 4 beats at 170 BPM to 19 seconds at 110 BPM). One diary episode surrounded an episode-fluttering. No atrial fibrillation  LHC 01/30/2018: Previously placed Mid LAD drug eluting stent is widely  patent. Mid RCA lesion is 20% stenosed. The left ventricular systolic function is normal. LV end diastolic pressure is normal. The left ventricular ejection fraction is greater than 65% by visual estimate. There is no mitral valve regurgitation. Ost Cx to Prox Cx lesion is 20% stenosed.   1. Single vessel CAD with patent stent mid LAD without restenosis 2. Mild non-obstructive disease in the mid RCA and ostial Circumflex.  3. Normal LV systolic function.    Recommendations: Continue medical management of CAD. Tobacco cessation is advised.   NUC stress 2018: Nuclear stress EF: 71%. Blood pressure demonstrated a normal response to exercise. Upsloping ST segment depression ST segment depression of 1 mm was noted during stress in the II, III, aVF, V5 and V6 leads. Findings consistent with prior myocardial infarction. This is a low risk  study. The left ventricular ejection fraction is hyperdynamic (>65%).   Small inferoapical wall infarction  EF is normal 71% Extracardiac activity in this region makes specificity of finding lower Patient only achieved 87% PMHR ECG has 1 mm ST segment depression In recovery    EKG:  01/20/2021: *** Prior EKG showed sinus with PACs on 12/02/2019  Recent Labs: No results found for requested labs within last 8760 hours.  Recent Lipid Panel    Component Value Date/Time   CHOL 107 05/10/2017 0832   TRIG 94 05/10/2017 0832   HDL 51 05/10/2017 0832   CHOLHDL 2.1 05/10/2017 0832   CHOLHDL 5 09/22/2010 0843   VLDL 20.6 09/22/2010 0843   LDLCALC 37 05/10/2017 0832     Risk Assessment/Calculations:      Physical Exam:    VS:  There were no vitals taken for this visit.    Wt Readings from Last 3 Encounters:  07/28/20 139 lb (63 kg)  07/20/20 139 lb (63 kg)  01/15/20 135 lb (61.2 kg)     GEN: Well nourished, well developed in no acute distress HEENT: Normal NECK: No JVD; No carotid bruits LYMPHATICS: No lymphadenopathy CARDIAC: RRR, no  murmurs, rubs, gallops RESPIRATORY:  Clear to auscultation without rales, wheezing or rhonchi  ABDOMEN: Soft, non-tender, non-distended MUSCULOSKELETAL:  No edema; No deformity  SKIN: Warm and dry NEUROLOGIC:  Alert and oriented x 3 PSYCHIATRIC:  Normal affect    ASSESSMENT:    No diagnosis found.  PLAN:    In order of problems listed above: No problem-specific Assessment & Plan notes found for this encounter.  Chest pain -With her known coronary disease and some chest tightness that she is noted when going up the stairs, we will go ahead and check a nuclear stress test since it has been about 4 years.  Looking for any high risk features.  If high risk, consider proceeding with cardiac catheterization.  Fatigue -Certainly could be related to COPD, deconditioning.  Continue to encourage exercise.  Smoking cessation.  Hyperlipidemia -On Crestor 20 a day excellent LDL prior 36.  Sinus pause -3.8 seconds at 5:40 AM when sleeping no symptoms reported.  Continue to monitor for any signs of syncope.  No atrial fibrillation noted.  No need for pacemaker at this time.  Risks and benefits of stress test reviewed.   Follow-up: ***  Medication Adjustments/Labs and Tests Ordered: Current medicines are reviewed at length with the patient today.  Concerns regarding medicines are outlined above.  No orders of the defined types were placed in this encounter.  No orders of the defined types were placed in this encounter.   There are no Patient Instructions on file for this visit.   I,Mathew Stumpf,acting as a Education administrator for [employer], MD.,have documented all relevant documentation on the behalf of Candee Furbish, MD,as directed by  Candee Furbish, MD while in the presence of Candee Furbish, MD.  ***  Signed, Madelin Rear  01/18/2021 1:42 PM    [place]

## 2021-01-20 ENCOUNTER — Ambulatory Visit: Payer: PPO | Admitting: Cardiology

## 2021-01-20 ENCOUNTER — Encounter: Payer: Self-pay | Admitting: Cardiology

## 2021-01-20 ENCOUNTER — Other Ambulatory Visit: Payer: Self-pay

## 2021-01-20 VITALS — BP 110/80 | HR 71 | Ht 63.0 in | Wt 134.0 lb

## 2021-01-20 DIAGNOSIS — I25110 Atherosclerotic heart disease of native coronary artery with unstable angina pectoris: Secondary | ICD-10-CM

## 2021-01-20 DIAGNOSIS — I491 Atrial premature depolarization: Secondary | ICD-10-CM | POA: Diagnosis not present

## 2021-01-20 DIAGNOSIS — E7800 Pure hypercholesterolemia, unspecified: Secondary | ICD-10-CM | POA: Diagnosis not present

## 2021-01-20 DIAGNOSIS — I700 Atherosclerosis of aorta: Secondary | ICD-10-CM

## 2021-01-20 DIAGNOSIS — J438 Other emphysema: Secondary | ICD-10-CM | POA: Diagnosis not present

## 2021-01-20 DIAGNOSIS — E785 Hyperlipidemia, unspecified: Secondary | ICD-10-CM | POA: Insufficient documentation

## 2021-01-20 DIAGNOSIS — Z720 Tobacco use: Secondary | ICD-10-CM

## 2021-01-20 DIAGNOSIS — I10 Essential (primary) hypertension: Secondary | ICD-10-CM | POA: Diagnosis not present

## 2021-01-20 NOTE — Assessment & Plan Note (Signed)
Heart catheterization 2019 showed moderate overall disease patent stent.  Continue with goal-directed medical therapy.  No changes made.  Continue with Crestor 20 mg a day high intensity dose prior LDL 36

## 2021-01-20 NOTE — Assessment & Plan Note (Signed)
Continue with aspirin, Crestor, blood pressure control.  Excellent.

## 2021-01-20 NOTE — Progress Notes (Signed)
Cardiology Office Note:    Date:  01/20/2021   ID:  Casey Kirk, DOB 1945-10-22, MRN [id number]  PCP:  Lawerance Cruel, MD   [place] Providers Cardiologist:  Candee Furbish, MD     Referring MD: Lawerance Cruel, MD    History of Present Illness:    Casey Kirk is a 75 y.o. female here for follow-up of CAD prior heart cath 20 1880% mid LAD, DES placed x1.  COPD continues to smoke bipolar disorder GERD IBS hyperlipidemia.  Prior ABIs were normal in 2018.  Also had mild plaque on carotid Dopplers.  Previous bradycardia has been noted with 3.8-second pause at 5:40 AM during sleeping, no symptoms reported.  Past few visits she had stated that she had been tired, had red palms.  Had occasional chest tightness throat tightness especially when fatigued or with stairs.  Denies any fevers chills nausea vomiting syncope bleeding.  Past Medical History:  Diagnosis Date   Allergic rhinitis    Aortic atherosclerosis ([place]) 08/17/2016   Arthritis    ARTHRITIS, HX OF 01/09/2007   BIPOLAR II DISORDER 03/17/2008   Carcinoma of vaginal vault (HCC)    COPD 12/21/2006   COPD (chronic obstructive pulmonary disease) ([place]) 12/21/2006   Emphysema on CT 06/2016 02/2016 FEV1 of 98%     Coronary artery calcification 08/17/2016   Coronary artery disease involving native coronary artery of native heart with unstable angina pectoris ([place])    Dysphasia 05/10/2011   Family history of early CAD 08/17/2016   GERD (gastroesophageal reflux disease)    GOUT 01/09/2007   Hx of colonic polyps    Hypothyroidism 10/17/2010   IBS (irritable bowel syndrome)    Osteoporosis    Sinusitis    Thyroid disease    TINEA CORPORIS 10/06/2009   Tobacco abuse 05/10/2011    Past Surgical History:  Procedure Laterality Date   ABDOMINAL HYSTERECTOMY     CA cells removed end of vagina   BREAST SURGERY     biopsy   COLONOSCOPY     CORONARY STENT INTERVENTION N/A 03/16/2017   Procedure: CORONARY STENT  INTERVENTION;  Surgeon: Burnell Blanks, MD;  Location: [facility];  Service: Cardiovascular;  Laterality: N/A;   EYE SURGERY Right    cataract   INTRAVASCULAR PRESSURE WIRE/FFR STUDY N/A 03/16/2017   Procedure: INTRAVASCULAR PRESSURE WIRE/FFR STUDY;  Surgeon: Burnell Blanks, MD;  Location: [facility];  Service: Cardiovascular;  Laterality: N/A;   LEFT HEART CATH AND CORONARY ANGIOGRAPHY N/A 03/16/2017   Procedure: LEFT HEART CATH AND CORONARY ANGIOGRAPHY;  Surgeon: Burnell Blanks, MD;  Location: [facility];  Service: Cardiovascular;  Laterality: N/A;   LEFT HEART CATH AND CORONARY ANGIOGRAPHY N/A 01/30/2018   Procedure: LEFT HEART CATH AND CORONARY ANGIOGRAPHY;  Surgeon: Burnell Blanks, MD;  Location: [facility];  Service: Cardiovascular;  Laterality: N/A;   MASS EXCISION Right 05/10/2018   Procedure: EXCISION MASS OF RIGHT PALATAL  WITH LOCAL SOFT TISSUE RECONSTRUCTION;  Surgeon: Jerrell Belfast, MD;  Location: [facility];  Service: ENT;  Laterality: Right;   TOOTH EXTRACTION     vaginal carcinoma surgery      Current Medications: Current Meds  Medication Sig   acetaminophen (TYLENOL) 500 MG tablet Take 500 mg by mouth every 8 (eight) hours as needed (pain).   albuterol (PROVENTIL HFA;VENTOLIN HFA) 108 (90 Base) MCG/ACT inhaler Inhale 2 puffs into the lungs every 6 (six) hours as needed  for wheezing or shortness of breath.   aspirin EC 81 MG tablet Take 81 mg by mouth daily.   CALCIUM/MAGNESIUM/ZINC FORMULA PO Take 1 tablet by mouth 3 (three) times a week.   cholecalciferol (VITAMIN D) 1000 units tablet Take 1,000 Units by mouth daily.   diphenhydrAMINE (BENADRYL) 25 mg capsule Take 25 mg by mouth daily as needed for allergies.   diphenoxylate-atropine (LOMOTIL) 2.5-0.025 MG tablet TAKE 2 TABLETS BY MOUTH AS NEEDED 4 TIMES A DAY   fish oil-omega-3 fatty acids 1000 MG capsule Take 1 g by mouth 2 (two) times a week. qod    fluticasone (FLONASE) 50 MCG/ACT nasal spray Place 2 sprays into both nostrils daily as needed for allergies or rhinitis.   Melatonin 3 MG TABS Take 3 mg by mouth at bedtime.   Multiple Vitamin (MULTIVITAMIN) tablet Take 1 tablet by mouth daily.   nicotine polacrilex (NICORETTE) 4 MG gum Take 4 mg by mouth as needed for smoking cessation.   rosuvastatin (CRESTOR) 20 MG tablet Take 20 mg by mouth daily.    traZODone (DESYREL) 50 MG tablet TAKE 0.5-1 TABLETS (25-50 MG TOTAL) BY MOUTH AT BEDTIME AS NEEDED.   Turmeric Curcumin 500 MG CAPS Take 1 capsule by mouth every other day.   vitamin C (ASCORBIC ACID) 500 MG tablet Take 500 mg by mouth daily.   vitamin E 100 UNIT capsule Take 100 Units by mouth daily.      Allergies:   Hydrocodone-acetaminophen, Latex, and Pneumococcal polysaccharide vaccine   Social History   Socioeconomic History   Marital status: Married    Spouse name: Not on file   Number of children: 2   Years of education: Not on file   Highest education level: Not on file  Occupational History   Occupation: retired  Tobacco Use   Smoking status: Every Day    Packs/day: 0.40    Years: 40.00    Pack years: 16.00    Types: Cigarettes   Smokeless tobacco: Never  Vaping Use   Vaping Use: Never used  Substance and Sexual Activity   Alcohol use: Not Currently    Comment: Rarely    Drug use: No   Sexual activity: Yes  Other Topics Concern   Not on file  Social History Narrative   Lives alone.  She has two grown children.   She is retired from working at [employer].   Highest level of education:  2 year business college   Social Determinants of Radio broadcast assistant Strain: Not on file  Food Insecurity: Not on file  Transportation Needs: Not on file  Physical Activity: Not on file  Stress: Not on file  Social Connections: Not on file     Family History: The patient's family history includes Alcohol abuse in an other family member; Arthritis in an other family  member; COPD in her mother; Cancer in an other family member; Colon polyps in her brother and mother; Diabetes in an other family member; Heart attack (age of onset: 55) in her brother; Heart disease in an other family member; Hyperlipidemia in an other family member; Hypertension in an other family member; Sudden death in an other family member; Tremor in her mother. There is no history of Colon cancer.  ROS:   Please see the history of present illness.     All other systems reviewed and are negative.  EKGs/Labs/Other Studies Reviewed:    The following studies were reviewed today:  NUC stress 2018: Nuclear stress  EF: 71%. Blood pressure demonstrated a normal response to exercise. Upsloping ST segment depression ST segment depression of 1 mm was noted during stress in the II, III, aVF, V5 and V6 leads. Findings consistent with prior myocardial infarction. This is a low risk study. The left ventricular ejection fraction is hyperdynamic (>65%).   Small inferoapical wall infarction  EF is normal 71% Extracardiac activity in this region makes specificity of finding lower Patient only achieved 87% PMHR ECG has 1 mm ST segment depression In recovery      Event monitor from 12/23/2019 reviewed: Sinus rhythm with average HR of 59 BPM 3.8 second pause at 5:40 AM (presumably sleeping, no symptoms reported) Frequent PAC's (7%) 73 episodes of SVT (ranging from 4 beats at 170 BPM to 19 seconds at 110 BPM). One diary episode surrounded an episode-fluttering. No atrial fibrillation   Cath 2019:   Previously placed Mid LAD drug eluting stent is widely patent. Mid RCA lesion is 20% stenosed. The left ventricular systolic function is normal. LV end diastolic pressure is normal. The left ventricular ejection fraction is greater than 65% by visual estimate. There is no mitral valve regurgitation. Ost Cx to Prox Cx lesion is 20% stenosed.   1. Single vessel CAD with patent stent mid LAD without  restenosis 2. Mild non-obstructive disease in the mid RCA and ostial Circumflex.  3. Normal LV systolic function.    Recommendations: Continue medical management of CAD. Tobacco cessation is advised.     EKG:  EKG is  ordered today.  The ekg ordered today demonstrates Sr PAC's 71  Recent Labs: No results found for requested labs within last 8760 hours.  Recent Lipid Panel    Component Value Date/Time   CHOL 107 05/10/2017 0832   TRIG 94 05/10/2017 0832   HDL 51 05/10/2017 0832   CHOLHDL 2.1 05/10/2017 0832   CHOLHDL 5 09/22/2010 0843   VLDL 20.6 09/22/2010 0843   LDLCALC 37 05/10/2017 0832     Risk Assessment/Calculations:          Physical Exam:    VS:  BP 110/80 (BP Location: Left Arm, Patient Position: Sitting, Cuff Size: Normal)   Pulse 71   Ht '5\' 3"'$  (1.6 m)   Wt 134 lb (60.8 kg)   SpO2 94%   BMI 23.74 kg/m     Wt Readings from Last 3 Encounters:  01/20/21 134 lb (60.8 kg)  07/28/20 139 lb (63 kg)  07/20/20 139 lb (63 kg)    GEN:  Well nourished, well developed in no acute distress HEENT: Normal NECK: No JVD; No carotid bruits LYMPHATICS: No lymphadenopathy CARDIAC: Frequent ectopy noted RRR, no murmurs, rubs, gallops RESPIRATORY:  Clear to auscultation without rales, wheezing or rhonchi  ABDOMEN: Soft, non-tender, non-distended MUSCULOSKELETAL:  No edema; No deformity  SKIN: Warm and dry NEUROLOGIC:  Alert and oriented x 3 PSYCHIATRIC:  Normal affect   ASSESSMENT:    1. Essential hypertension   2. Coronary artery disease involving native coronary artery of native heart with unstable angina pectoris ([place])   3. Aortic atherosclerosis ([place])   4. Other emphysema ([place])   5. Tobacco abuse   6. Pure hypercholesterolemia   7. Premature atrial contractions    PLAN:    In order of problems listed above:  Coronary artery disease involving native coronary artery of native heart with unstable angina pectoris ([place]) Heart catheterization 2019 showed  moderate overall disease patent stent.  Continue with goal-directed medical therapy.  No changes made.  Continue with Crestor 20 mg a day high intensity dose prior LDL 36  Aortic atherosclerosis ([place]) Continue with aspirin, Crestor, blood pressure control.  Excellent.  COPD (chronic obstructive pulmonary disease) (HCC) Mild wheeze heard on exam.  Continuing to monitor by Dr. Harrington Challenger.  Medications reviewed.  Tobacco abuse Continue to encourage tobacco cessation  Hyperlipidemia Crestor 20 mg a day without any significant myalgias.  She does state that she has some muscle aches in the morning but these go away.  She was contemplating taking coenzyme Q 10.  This is fine if she would like to take it.  Prior LDL 36 excellent.  Continue with high intensity dose and she has underlying coronary artery disease prior stent.  Premature atrial contractions Noted again on ECG.  Monitor reviewed as above.  Overall benign.  Fairly frequent.  Heard on exam.  No atrial fibrillation.      1 year    Medication Adjustments/Labs and Tests Ordered: Current medicines are reviewed at length with the patient today.  Concerns regarding medicines are outlined above.  Orders Placed This Encounter  Procedures   EKG 12-Lead    No orders of the defined types were placed in this encounter.   Patient Instructions  Medication Instructions:  The current medical regimen is effective;  continue present plan and medications.  *If you need a refill on your cardiac medications before your next appointment, please call your pharmacy*  Follow-Up: At [facility], you and your health needs are our priority.  As part of our continuing mission to provide you with exceptional heart care, we have created designated Provider Care Teams.  These Care Teams include your primary Cardiologist (physician) and Advanced Practice Providers (APPs -  Physician Assistants and Nurse Practitioners) who all work together to provide you with  the care you need, when you need it.  We recommend signing up for the patient portal called "MyChart".  Sign up information is provided on this After Visit Summary.  MyChart is used to connect with patients for Virtual Visits (Telemedicine).  Patients are able to view lab/test results, encounter notes, upcoming appointments, etc.  Non-urgent messages can be sent to your provider as well.   To learn more about what you can do with MyChart, go to [email or web address].    Your next appointment:   1 year(s)  The format for your next appointment:   In Person  Provider:   Candee Furbish, MD  Thank you for choosing [facility]!!     Signed, Candee Furbish, MD  01/20/2021 10:20 AM    [place]

## 2021-01-20 NOTE — Assessment & Plan Note (Signed)
Continue to encourage tobacco cessation. 

## 2021-01-20 NOTE — Assessment & Plan Note (Signed)
Noted again on ECG.  Monitor reviewed as above.  Overall benign.  Fairly frequent.  Heard on exam.  No atrial fibrillation.

## 2021-01-20 NOTE — Assessment & Plan Note (Signed)
Crestor 20 mg a day without any significant myalgias.  She does state that she has some muscle aches in the morning but these go away.  She was contemplating taking coenzyme Q 10.  This is fine if she would like to take it.  Prior LDL 36 excellent.  Continue with high intensity dose and she has underlying coronary artery disease prior stent.

## 2021-01-20 NOTE — Assessment & Plan Note (Signed)
Mild wheeze heard on exam.  Continuing to monitor by Dr. Harrington Challenger.  Medications reviewed.

## 2021-01-20 NOTE — Patient Instructions (Signed)
Medication Instructions:  The current medical regimen is effective;  continue present plan and medications.  *If you need a refill on your cardiac medications before your next appointment, please call your pharmacy*  Follow-Up: At [facility], you and your health needs are our priority.  As part of our continuing mission to provide you with exceptional heart care, we have created designated Provider Care Teams.  These Care Teams include your primary Cardiologist (physician) and Advanced Practice Providers (APPs -  Physician Assistants and Nurse Practitioners) who all work together to provide you with the care you need, when you need it.  We recommend signing up for the patient portal called "MyChart".  Sign up information is provided on this After Visit Summary.  MyChart is used to connect with patients for Virtual Visits (Telemedicine).  Patients are able to view lab/test results, encounter notes, upcoming appointments, etc.  Non-urgent messages can be sent to your provider as well.   To learn more about what you can do with MyChart, go to [email or web address].    Your next appointment:   1 year(s)  The format for your next appointment:   In Person  Provider:   Mark Skains, MD   Thank you for choosing [facility]!!    

## 2021-01-27 DIAGNOSIS — I2510 Atherosclerotic heart disease of native coronary artery without angina pectoris: Secondary | ICD-10-CM | POA: Diagnosis not present

## 2021-01-27 DIAGNOSIS — K219 Gastro-esophageal reflux disease without esophagitis: Secondary | ICD-10-CM | POA: Diagnosis not present

## 2021-01-27 DIAGNOSIS — J449 Chronic obstructive pulmonary disease, unspecified: Secondary | ICD-10-CM | POA: Diagnosis not present

## 2021-01-27 DIAGNOSIS — M810 Age-related osteoporosis without current pathological fracture: Secondary | ICD-10-CM | POA: Diagnosis not present

## 2021-01-27 DIAGNOSIS — J42 Unspecified chronic bronchitis: Secondary | ICD-10-CM | POA: Diagnosis not present

## 2021-01-27 DIAGNOSIS — I10 Essential (primary) hypertension: Secondary | ICD-10-CM | POA: Diagnosis not present

## 2021-01-27 DIAGNOSIS — F3181 Bipolar II disorder: Secondary | ICD-10-CM | POA: Diagnosis not present

## 2021-01-27 DIAGNOSIS — E7800 Pure hypercholesterolemia, unspecified: Secondary | ICD-10-CM | POA: Diagnosis not present

## 2021-02-02 DIAGNOSIS — Z1231 Encounter for screening mammogram for malignant neoplasm of breast: Secondary | ICD-10-CM | POA: Diagnosis not present

## 2021-02-08 DIAGNOSIS — M791 Myalgia, unspecified site: Secondary | ICD-10-CM | POA: Diagnosis not present

## 2021-02-08 DIAGNOSIS — L439 Lichen planus, unspecified: Secondary | ICD-10-CM | POA: Diagnosis not present

## 2021-02-11 ENCOUNTER — Other Ambulatory Visit: Payer: Self-pay | Admitting: Otolaryngology

## 2021-02-11 DIAGNOSIS — C069 Malignant neoplasm of mouth, unspecified: Secondary | ICD-10-CM

## 2021-02-17 DIAGNOSIS — R928 Other abnormal and inconclusive findings on diagnostic imaging of breast: Secondary | ICD-10-CM | POA: Diagnosis not present

## 2021-02-21 DIAGNOSIS — I2510 Atherosclerotic heart disease of native coronary artery without angina pectoris: Secondary | ICD-10-CM | POA: Diagnosis not present

## 2021-02-21 DIAGNOSIS — K1370 Unspecified lesions of oral mucosa: Secondary | ICD-10-CM | POA: Diagnosis not present

## 2021-02-21 DIAGNOSIS — R030 Elevated blood-pressure reading, without diagnosis of hypertension: Secondary | ICD-10-CM | POA: Diagnosis not present

## 2021-03-01 ENCOUNTER — Ambulatory Visit
Admission: RE | Admit: 2021-03-01 | Discharge: 2021-03-01 | Disposition: A | Discharge status: Home / Self Care | Payer: PPO | Source: Ambulatory Visit | Attending: Otolaryngology | Admitting: Otolaryngology

## 2021-03-01 ENCOUNTER — Other Ambulatory Visit: Payer: PPO

## 2021-03-01 DIAGNOSIS — C049 Malignant neoplasm of floor of mouth, unspecified: Secondary | ICD-10-CM | POA: Diagnosis not present

## 2021-03-01 DIAGNOSIS — I700 Atherosclerosis of aorta: Secondary | ICD-10-CM | POA: Diagnosis not present

## 2021-03-01 DIAGNOSIS — J439 Emphysema, unspecified: Secondary | ICD-10-CM | POA: Diagnosis not present

## 2021-03-01 DIAGNOSIS — M47812 Spondylosis without myelopathy or radiculopathy, cervical region: Secondary | ICD-10-CM | POA: Diagnosis not present

## 2021-03-01 DIAGNOSIS — C069 Malignant neoplasm of mouth, unspecified: Secondary | ICD-10-CM

## 2021-03-01 MED ORDER — IOPAMIDOL (ISOVUE-300) INJECTION 61%
75.0000 mL | Freq: Once | INTRAVENOUS | Status: AC | PRN
  Administered 2021-03-01: 75 mL via INTRAVENOUS

## 2021-03-07 DIAGNOSIS — Z23 Encounter for immunization: Secondary | ICD-10-CM | POA: Diagnosis not present

## 2021-04-25 ENCOUNTER — Other Ambulatory Visit: Payer: Self-pay | Admitting: *Deleted

## 2021-04-25 DIAGNOSIS — Z87891 Personal history of nicotine dependence: Secondary | ICD-10-CM

## 2021-04-25 DIAGNOSIS — M1990 Unspecified osteoarthritis, unspecified site: Secondary | ICD-10-CM | POA: Diagnosis not present

## 2021-04-25 DIAGNOSIS — F17200 Nicotine dependence, unspecified, uncomplicated: Secondary | ICD-10-CM | POA: Diagnosis not present

## 2021-04-25 DIAGNOSIS — I10 Essential (primary) hypertension: Secondary | ICD-10-CM | POA: Diagnosis not present

## 2021-04-25 DIAGNOSIS — F17210 Nicotine dependence, cigarettes, uncomplicated: Secondary | ICD-10-CM

## 2021-05-09 ENCOUNTER — Ambulatory Visit (INDEPENDENT_AMBULATORY_CARE_PROVIDER_SITE_OTHER)
Admission: RE | Admit: 2021-05-09 | Discharge: 2021-05-09 | Disposition: A | Discharge status: Home / Self Care | Payer: PPO | Source: Ambulatory Visit | Attending: Cardiovascular Disease | Admitting: Cardiovascular Disease

## 2021-05-09 ENCOUNTER — Other Ambulatory Visit: Payer: Self-pay

## 2021-05-09 DIAGNOSIS — F17210 Nicotine dependence, cigarettes, uncomplicated: Secondary | ICD-10-CM

## 2021-05-09 DIAGNOSIS — Z87891 Personal history of nicotine dependence: Secondary | ICD-10-CM | POA: Diagnosis not present

## 2021-05-11 ENCOUNTER — Other Ambulatory Visit: Payer: Self-pay | Admitting: Acute Care

## 2021-05-11 DIAGNOSIS — Z87891 Personal history of nicotine dependence: Secondary | ICD-10-CM

## 2021-05-11 DIAGNOSIS — F17210 Nicotine dependence, cigarettes, uncomplicated: Secondary | ICD-10-CM

## 2021-05-18 ENCOUNTER — Telehealth: Payer: Self-pay | Admitting: Acute Care

## 2021-05-19 NOTE — Telephone Encounter (Signed)
Lm for patient.  

## 2021-05-19 NOTE — Telephone Encounter (Signed)
Pt informed of CT results per Eric Form, NP.  PT verbalized understanding.  Copy of CT was sent to PCP and Dr Marlou Porch.  Order was placed for 1 yr f/u CT.

## 2021-05-25 DIAGNOSIS — I10 Essential (primary) hypertension: Secondary | ICD-10-CM | POA: Diagnosis not present

## 2021-05-25 DIAGNOSIS — K219 Gastro-esophageal reflux disease without esophagitis: Secondary | ICD-10-CM | POA: Diagnosis not present

## 2021-05-25 DIAGNOSIS — M1990 Unspecified osteoarthritis, unspecified site: Secondary | ICD-10-CM | POA: Diagnosis not present

## 2021-05-25 DIAGNOSIS — J42 Unspecified chronic bronchitis: Secondary | ICD-10-CM | POA: Diagnosis not present

## 2021-05-25 DIAGNOSIS — E7800 Pure hypercholesterolemia, unspecified: Secondary | ICD-10-CM | POA: Diagnosis not present

## 2021-05-25 DIAGNOSIS — J449 Chronic obstructive pulmonary disease, unspecified: Secondary | ICD-10-CM | POA: Diagnosis not present

## 2021-05-25 DIAGNOSIS — M810 Age-related osteoporosis without current pathological fracture: Secondary | ICD-10-CM | POA: Diagnosis not present

## 2021-05-25 DIAGNOSIS — I2510 Atherosclerotic heart disease of native coronary artery without angina pectoris: Secondary | ICD-10-CM | POA: Diagnosis not present

## 2021-06-21 ENCOUNTER — Other Ambulatory Visit: Payer: Self-pay | Admitting: Physician Assistant

## 2021-06-28 DIAGNOSIS — E7800 Pure hypercholesterolemia, unspecified: Secondary | ICD-10-CM | POA: Diagnosis not present

## 2021-06-28 DIAGNOSIS — I10 Essential (primary) hypertension: Secondary | ICD-10-CM | POA: Diagnosis not present

## 2021-06-28 DIAGNOSIS — J449 Chronic obstructive pulmonary disease, unspecified: Secondary | ICD-10-CM | POA: Diagnosis not present

## 2021-07-19 DIAGNOSIS — N898 Other specified noninflammatory disorders of vagina: Secondary | ICD-10-CM | POA: Diagnosis not present

## 2021-07-19 DIAGNOSIS — L900 Lichen sclerosus et atrophicus: Secondary | ICD-10-CM | POA: Diagnosis not present

## 2021-07-19 DIAGNOSIS — R350 Frequency of micturition: Secondary | ICD-10-CM | POA: Diagnosis not present

## 2021-07-19 DIAGNOSIS — N952 Postmenopausal atrophic vaginitis: Secondary | ICD-10-CM | POA: Diagnosis not present

## 2021-08-17 DIAGNOSIS — Z01419 Encounter for gynecological examination (general) (routine) without abnormal findings: Secondary | ICD-10-CM | POA: Diagnosis not present

## 2021-08-17 DIAGNOSIS — F17200 Nicotine dependence, unspecified, uncomplicated: Secondary | ICD-10-CM | POA: Diagnosis not present

## 2021-08-17 DIAGNOSIS — L900 Lichen sclerosus et atrophicus: Secondary | ICD-10-CM | POA: Diagnosis not present

## 2021-08-17 DIAGNOSIS — N952 Postmenopausal atrophic vaginitis: Secondary | ICD-10-CM | POA: Diagnosis not present

## 2021-08-17 DIAGNOSIS — Z1231 Encounter for screening mammogram for malignant neoplasm of breast: Secondary | ICD-10-CM | POA: Diagnosis not present

## 2021-08-25 ENCOUNTER — Ambulatory Visit: Payer: PPO | Admitting: Physician Assistant

## 2021-09-02 ENCOUNTER — Encounter: Payer: Self-pay | Admitting: Physician Assistant

## 2021-09-02 ENCOUNTER — Ambulatory Visit: Payer: PPO | Admitting: Physician Assistant

## 2021-09-02 DIAGNOSIS — F17200 Nicotine dependence, unspecified, uncomplicated: Secondary | ICD-10-CM

## 2021-09-02 DIAGNOSIS — F5105 Insomnia due to other mental disorder: Secondary | ICD-10-CM

## 2021-09-02 DIAGNOSIS — F99 Mental disorder, not otherwise specified: Secondary | ICD-10-CM

## 2021-09-02 MED ORDER — TRAZODONE HCL 50 MG PO TABS: 25.0000 mg | ORAL_TABLET | Freq: Every evening | ORAL | 3 refills | Status: DC | PRN
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
## 2021-09-06 DIAGNOSIS — H26493 Other secondary cataract, bilateral: Secondary | ICD-10-CM | POA: Diagnosis not present

## 2021-09-06 DIAGNOSIS — H43813 Vitreous degeneration, bilateral: Secondary | ICD-10-CM | POA: Diagnosis not present

## 2021-09-06 DIAGNOSIS — H04123 Dry eye syndrome of bilateral lacrimal glands: Secondary | ICD-10-CM | POA: Diagnosis not present

## 2021-10-12 DIAGNOSIS — I10 Essential (primary) hypertension: Secondary | ICD-10-CM | POA: Diagnosis not present

## 2021-10-12 DIAGNOSIS — Z86010 Personal history of colonic polyps: Secondary | ICD-10-CM | POA: Diagnosis not present

## 2021-10-12 DIAGNOSIS — M810 Age-related osteoporosis without current pathological fracture: Secondary | ICD-10-CM | POA: Diagnosis not present

## 2021-10-12 DIAGNOSIS — K52831 Collagenous colitis: Secondary | ICD-10-CM | POA: Diagnosis not present

## 2021-10-12 DIAGNOSIS — R151 Fecal smearing: Secondary | ICD-10-CM | POA: Diagnosis not present

## 2021-10-12 DIAGNOSIS — E7800 Pure hypercholesterolemia, unspecified: Secondary | ICD-10-CM | POA: Diagnosis not present

## 2021-10-27 DIAGNOSIS — F17200 Nicotine dependence, unspecified, uncomplicated: Secondary | ICD-10-CM | POA: Diagnosis not present

## 2021-10-27 DIAGNOSIS — M542 Cervicalgia: Secondary | ICD-10-CM | POA: Diagnosis not present

## 2021-10-27 DIAGNOSIS — I700 Atherosclerosis of aorta: Secondary | ICD-10-CM | POA: Diagnosis not present

## 2021-10-27 DIAGNOSIS — K219 Gastro-esophageal reflux disease without esophagitis: Secondary | ICD-10-CM | POA: Diagnosis not present

## 2021-10-27 DIAGNOSIS — I779 Disorder of arteries and arterioles, unspecified: Secondary | ICD-10-CM | POA: Diagnosis not present

## 2021-10-27 DIAGNOSIS — F411 Generalized anxiety disorder: Secondary | ICD-10-CM | POA: Diagnosis not present

## 2021-10-27 DIAGNOSIS — E7800 Pure hypercholesterolemia, unspecified: Secondary | ICD-10-CM | POA: Diagnosis not present

## 2021-10-27 DIAGNOSIS — B370 Candidal stomatitis: Secondary | ICD-10-CM | POA: Diagnosis not present

## 2021-10-27 DIAGNOSIS — M810 Age-related osteoporosis without current pathological fracture: Secondary | ICD-10-CM | POA: Diagnosis not present

## 2021-10-27 DIAGNOSIS — J449 Chronic obstructive pulmonary disease, unspecified: Secondary | ICD-10-CM | POA: Diagnosis not present

## 2021-10-27 DIAGNOSIS — I10 Essential (primary) hypertension: Secondary | ICD-10-CM | POA: Diagnosis not present

## 2021-10-27 DIAGNOSIS — Z0000 Encounter for general adult medical examination without abnormal findings: Secondary | ICD-10-CM | POA: Diagnosis not present

## 2021-11-15 DIAGNOSIS — H903 Sensorineural hearing loss, bilateral: Secondary | ICD-10-CM | POA: Diagnosis not present

## 2022-01-11 DIAGNOSIS — I10 Essential (primary) hypertension: Secondary | ICD-10-CM | POA: Diagnosis not present

## 2022-01-11 DIAGNOSIS — M1990 Unspecified osteoarthritis, unspecified site: Secondary | ICD-10-CM | POA: Diagnosis not present

## 2022-01-11 DIAGNOSIS — E7800 Pure hypercholesterolemia, unspecified: Secondary | ICD-10-CM | POA: Diagnosis not present

## 2022-01-11 DIAGNOSIS — I2510 Atherosclerotic heart disease of native coronary artery without angina pectoris: Secondary | ICD-10-CM | POA: Diagnosis not present

## 2022-01-11 DIAGNOSIS — K219 Gastro-esophageal reflux disease without esophagitis: Secondary | ICD-10-CM | POA: Diagnosis not present

## 2022-01-11 DIAGNOSIS — M810 Age-related osteoporosis without current pathological fracture: Secondary | ICD-10-CM | POA: Diagnosis not present

## 2022-01-11 DIAGNOSIS — J449 Chronic obstructive pulmonary disease, unspecified: Secondary | ICD-10-CM | POA: Diagnosis not present

## 2022-03-06 DIAGNOSIS — D225 Melanocytic nevi of trunk: Secondary | ICD-10-CM | POA: Diagnosis not present

## 2022-03-06 DIAGNOSIS — L570 Actinic keratosis: Secondary | ICD-10-CM | POA: Diagnosis not present

## 2022-03-06 DIAGNOSIS — L433 Subacute (active) lichen planus: Secondary | ICD-10-CM | POA: Diagnosis not present

## 2022-03-06 DIAGNOSIS — D485 Neoplasm of uncertain behavior of skin: Secondary | ICD-10-CM | POA: Diagnosis not present

## 2022-03-06 DIAGNOSIS — L821 Other seborrheic keratosis: Secondary | ICD-10-CM | POA: Diagnosis not present

## 2022-03-06 DIAGNOSIS — D0439 Carcinoma in situ of skin of other parts of face: Secondary | ICD-10-CM | POA: Diagnosis not present

## 2022-03-14 DIAGNOSIS — Z1231 Encounter for screening mammogram for malignant neoplasm of breast: Secondary | ICD-10-CM | POA: Diagnosis not present

## 2022-04-17 ENCOUNTER — Ambulatory Visit: Payer: PPO | Admitting: Cardiology

## 2022-05-09 ENCOUNTER — Ambulatory Visit
Admission: RE | Admit: 2022-05-09 | Discharge: 2022-05-09 | Disposition: A | Discharge status: Home / Self Care | Payer: PPO | Source: Ambulatory Visit | Attending: Family Medicine | Admitting: Family Medicine

## 2022-05-09 DIAGNOSIS — F17210 Nicotine dependence, cigarettes, uncomplicated: Secondary | ICD-10-CM

## 2022-05-09 DIAGNOSIS — Z87891 Personal history of nicotine dependence: Secondary | ICD-10-CM

## 2022-05-12 ENCOUNTER — Other Ambulatory Visit: Payer: Self-pay | Admitting: Acute Care

## 2022-05-12 DIAGNOSIS — F17210 Nicotine dependence, cigarettes, uncomplicated: Secondary | ICD-10-CM

## 2022-05-12 DIAGNOSIS — Z122 Encounter for screening for malignant neoplasm of respiratory organs: Secondary | ICD-10-CM

## 2022-05-12 DIAGNOSIS — Z87891 Personal history of nicotine dependence: Secondary | ICD-10-CM

## 2022-06-12 DIAGNOSIS — R051 Acute cough: Secondary | ICD-10-CM | POA: Diagnosis not present

## 2022-06-12 DIAGNOSIS — R0989 Other specified symptoms and signs involving the circulatory and respiratory systems: Secondary | ICD-10-CM | POA: Diagnosis not present

## 2022-06-12 DIAGNOSIS — Z03818 Encounter for observation for suspected exposure to other biological agents ruled out: Secondary | ICD-10-CM | POA: Diagnosis not present

## 2022-06-12 DIAGNOSIS — Z6824 Body mass index (BMI) 24.0-24.9, adult: Secondary | ICD-10-CM | POA: Diagnosis not present

## 2022-06-12 DIAGNOSIS — J0110 Acute frontal sinusitis, unspecified: Secondary | ICD-10-CM | POA: Diagnosis not present

## 2022-06-16 ENCOUNTER — Encounter: Payer: Self-pay | Admitting: Cardiology

## 2022-06-16 ENCOUNTER — Encounter: Payer: Self-pay | Admitting: *Deleted

## 2022-06-16 ENCOUNTER — Ambulatory Visit: Payer: PPO | Attending: Cardiology | Admitting: Cardiology

## 2022-06-16 VITALS — BP 140/56 | HR 57 | Ht 62.0 in | Wt 133.0 lb

## 2022-06-16 DIAGNOSIS — I10 Essential (primary) hypertension: Secondary | ICD-10-CM

## 2022-06-16 DIAGNOSIS — I491 Atrial premature depolarization: Secondary | ICD-10-CM | POA: Diagnosis not present

## 2022-06-16 DIAGNOSIS — R0789 Other chest pain: Secondary | ICD-10-CM | POA: Diagnosis not present

## 2022-06-16 DIAGNOSIS — I700 Atherosclerosis of aorta: Secondary | ICD-10-CM | POA: Diagnosis not present

## 2022-06-16 DIAGNOSIS — I25110 Atherosclerotic heart disease of native coronary artery with unstable angina pectoris: Secondary | ICD-10-CM | POA: Diagnosis not present

## 2022-06-16 DIAGNOSIS — Z720 Tobacco use: Secondary | ICD-10-CM | POA: Diagnosis not present

## 2022-06-16 NOTE — Patient Instructions (Signed)
Medication Instructions:  The current medical regimen is effective;  continue present plan and medications.  *If you need a refill on your cardiac medications before your next appointment, please call your pharmacy*   Testing/Procedures: Your physician has requested that you have a lexiscan myoview. For further information please visit [email or web address]. Please follow instruction sheet, as given.  Follow-Up: At [facility], you and your health needs are our priority.  As part of our continuing mission to provide you with exceptional heart care, we have created designated Provider Care Teams.  These Care Teams include your primary Cardiologist (physician) and Advanced Practice Providers (APPs -  Physician Assistants and Nurse Practitioners) who all work together to provide you with the care you need, when you need it.  We recommend signing up for the patient portal called "MyChart".  Sign up information is provided on this After Visit Summary.  MyChart is used to connect with patients for Virtual Visits (Telemedicine).  Patients are able to view lab/test results, encounter notes, upcoming appointments, etc.  Non-urgent messages can be sent to your provider as well.   To learn more about what you can do with MyChart, go to [email or web address].    Your next appointment:   1 year(s)  Provider:   Candee Furbish, MD

## 2022-06-16 NOTE — Progress Notes (Signed)
Cardiology Office Note:    Date:  06/16/2022   ID:  Casey Kirk, DOB 13-May-1946, MRN [id number]  PCP:  Casey Cruel, MD   Casey Kirk Providers Cardiologist:  Casey Furbish, MD     Referring MD: Casey Cruel, MD    History of Present Illness:    Casey Kirk is a 77 y.o. female here for follow-up of CAD prior heart cath 20 1880% mid LAD, DES placed x1.  COPD continues to smoke bipolar disorder GERD IBS hyperlipidemia.  Prior ABIs were normal in 2018.  Also had mild plaque on carotid Dopplers.  Previous bradycardia has been noted with 3.8-second pause at 5:40 AM during sleeping, no symptoms reported.  Past few visits she had stated that she had been tired, had red palms.  Had occasional chest tightness throat tightness especially when fatigued or with stairs. Still feels tight in chest at times. Hands and feet stay red.   Denies any fevers chills nausea vomiting syncope bleeding.  Past Medical History:  Diagnosis Date   Allergic rhinitis    Aortic atherosclerosis ([place]) 08/17/2016   Arthritis    ARTHRITIS, HX OF 01/09/2007   BIPOLAR II DISORDER 03/17/2008   Carcinoma of vaginal vault (HCC)    COPD 12/21/2006   COPD (chronic obstructive pulmonary disease) ([place]) 12/21/2006   Emphysema on CT 06/2016 02/2016 FEV1 of 98%     Coronary artery calcification 08/17/2016   Coronary artery disease involving native coronary artery of native heart with unstable angina pectoris ([name])    Dysphasia 05/10/2011   Family history of early CAD 08/17/2016   GERD (gastroesophageal reflux disease)    GOUT 01/09/2007   Hx of colonic polyps    Hypothyroidism 10/17/2010   IBS (irritable bowel syndrome)    Osteoporosis    Sinusitis    Thyroid disease    TINEA CORPORIS 10/06/2009   Tobacco abuse 05/10/2011    Past Surgical History:  Procedure Laterality Date   ABDOMINAL HYSTERECTOMY     CA cells removed end of vagina   BREAST SURGERY     biopsy   COLONOSCOPY     CORONARY STENT  INTERVENTION N/A 03/16/2017   Procedure: CORONARY STENT INTERVENTION;  Surgeon: Burnell Blanks, MD;  Location: [facility];  Service: Cardiovascular;  Laterality: N/A;   EYE SURGERY Right    cataract   INTRAVASCULAR PRESSURE WIRE/FFR STUDY N/A 03/16/2017   Procedure: INTRAVASCULAR PRESSURE WIRE/FFR STUDY;  Surgeon: Burnell Blanks, MD;  Location: [facility] [place];  Service: Cardiovascular;  Laterality: N/A;   LEFT HEART CATH AND CORONARY ANGIOGRAPHY N/A 03/16/2017   Procedure: LEFT HEART CATH AND CORONARY ANGIOGRAPHY;  Surgeon: Burnell Blanks, MD;  Location: [place];  Service: Cardiovascular;  Laterality: N/A;   LEFT HEART CATH AND CORONARY ANGIOGRAPHY N/A 01/30/2018   Procedure: LEFT HEART CATH AND CORONARY ANGIOGRAPHY;  Surgeon: Burnell Blanks, MD;  Location: [place] [facility];  Service: Cardiovascular;  Laterality: N/A;   MASS EXCISION Right 05/10/2018   Procedure: EXCISION MASS OF RIGHT PALATAL  WITH LOCAL SOFT TISSUE RECONSTRUCTION;  Surgeon: Jerrell Belfast, MD;  Location: [place];  Service: ENT;  Laterality: Right;   TOOTH EXTRACTION     vaginal carcinoma surgery      Current Medications: Current Meds  Medication Sig   acetaminophen (TYLENOL) 500 MG tablet Take 500 mg by mouth every 8 (eight) hours as needed (pain).   albuterol (PROVENTIL HFA;VENTOLIN HFA) 108 (90 Base) MCG/ACT  inhaler Inhale 2 puffs into the lungs every 6 (six) hours as needed for wheezing or shortness of breath.   aspirin EC 81 MG tablet Take 81 mg by mouth daily.   CALCIUM/MAGNESIUM/ZINC FORMULA PO Take 1 tablet by mouth 3 (three) times a week.   cholecalciferol (VITAMIN D) 1000 units tablet Take 1,000 Units by mouth daily.   conjugated estrogens (PREMARIN) vaginal cream as needed (dryness).   diphenhydrAMINE (BENADRYL) 25 mg capsule Take 25 mg by mouth daily as needed for allergies.   diphenoxylate-atropine (LOMOTIL) 2.5-0.025 MG tablet TAKE 2 TABLETS BY MOUTH  AS NEEDED 4 TIMES A DAY   fish oil-omega-3 fatty acids 1000 MG capsule Take 1 g by mouth 2 (two) times a week. qod   fluticasone (FLONASE) 50 MCG/ACT nasal spray Place 2 sprays into both nostrils daily as needed for allergies or rhinitis.   guaiFENesin (MUCINEX) 600 MG 12 hr tablet as needed for cough or to loosen phlegm.   Melatonin 3 MG TABS Take 3 mg by mouth at bedtime.   metoprolol succinate (TOPROL-XL) 25 MG 24 hr tablet Take 25 mg by mouth daily.   Misc Natural Products (TURMERIC CURCUMIN) CAPS daily at 6 (six) AM.   Multiple Vitamin (MULTIVITAMIN) tablet Take 1 tablet by mouth daily.   nicotine polacrilex (NICORETTE) 4 MG gum Take 4 mg by mouth as needed for smoking cessation.   nitroGLYCERIN (NITROSTAT) 0.4 MG SL tablet every 5 (five) minutes as needed for chest pain.   rosuvastatin (CRESTOR) 20 MG tablet Take 20 mg by mouth daily.    traZODone (DESYREL) 50 MG tablet Take 0.5-1 tablets (25-50 mg total) by mouth at bedtime as needed.   triamcinolone (KENALOG) 0.1 % paste as needed.   Turmeric Curcumin 500 MG CAPS Take 1 capsule by mouth every other day.   vitamin C (ASCORBIC ACID) 500 MG tablet Take 500 mg by mouth daily.   vitamin E 100 UNIT capsule Take 100 Units by mouth daily.      Allergies:   Hydrocodone-acetaminophen, Latex, Pneumococcal polysaccharide vaccine, and Hydrocodone   Social History   Socioeconomic History   Marital status: Married    Spouse name: Not on file   Number of children: 2   Years of education: Not on file   Highest education level: Not on file  Occupational History   Occupation: retired  Tobacco Use   Smoking status: Every Day    Packs/day: 0.20    Years: 40.00    Total pack years: 8.00    Types: Cigarettes   Smokeless tobacco: Never  Vaping Use   Vaping Use: Never used  Substance and Sexual Activity   Alcohol use: Not Currently    Comment: Rarely    Drug use: No   Sexual activity: Yes  Other Topics Concern   Not on file  Social  History Narrative   Lives alone.  She has two grown children.   She is retired from working at [employer].   Highest level of education:  2 year business college   Social Determinants of Health   Financial Resource Strain: Not on file  Food Insecurity: Not on file  Transportation Needs: Not on file  Physical Activity: Not on file  Stress: Not on file  Social Connections: Not on file     Family History: The patient's family history includes Alcohol abuse in an other family member; Arthritis in an other family member; COPD in her mother; Cancer in an other family member; Colon polyps in  her brother and mother; Diabetes in an other family member; Heart attack (age of onset: 23) in her brother; Heart disease in an other family member; Hyperlipidemia in an other family member; Hypertension in an other family member; Sudden death in an other family member; Tremor in her mother. There is no history of Colon cancer.  ROS:   Please see the history of present illness.     All other systems reviewed and are negative.  EKGs/Labs/Other Studies Reviewed:    The following studies were reviewed today:  NUC stress 2018: Nuclear stress EF: 71%. Blood pressure demonstrated a normal response to exercise. Upsloping ST segment depression ST segment depression of 1 mm was noted during stress in the II, III, aVF, V5 and V6 leads. Findings consistent with prior myocardial infarction. This is a low risk study. The left ventricular ejection fraction is hyperdynamic (>65%).   Small inferoapical wall infarction  EF is normal 71% Extracardiac activity in this region makes specificity of finding lower Patient only achieved 87% PMHR ECG has 1 mm ST segment depression In recovery      Event monitor from 12/23/2019 reviewed: Sinus rhythm with average HR of 59 BPM 3.8 second pause at 5:40 AM (presumably sleeping, no symptoms reported) Frequent PAC's (7%) 73 episodes of SVT (ranging from 4 beats at 170 BPM to 19  seconds at 110 BPM). One diary episode surrounded an episode-fluttering. No atrial fibrillation   Cath 2019:   Previously placed Mid LAD drug eluting stent is widely patent. Mid RCA lesion is 20% stenosed. The left ventricular systolic function is normal. LV end diastolic pressure is normal. The left ventricular ejection fraction is greater than 65% by visual estimate. There is no mitral valve regurgitation. Ost Cx to Prox Cx lesion is 20% stenosed.   1. Single vessel CAD with patent stent mid LAD without restenosis 2. Mild non-obstructive disease in the mid RCA and ostial Circumflex.  3. Normal LV systolic function.    Recommendations: Continue medical management of CAD. Tobacco cessation is advised.     EKG:  EKG is  ordered today.  The ekg ordered today demonstrates Sr PAC's 71  Recent Labs: No results found for requested labs within last 365 days.  Recent Lipid Panel    Component Value Date/Time   CHOL 107 05/10/2017 0832   TRIG 94 05/10/2017 0832   HDL 51 05/10/2017 0832   CHOLHDL 2.1 05/10/2017 0832   CHOLHDL 5 09/22/2010 0843   VLDL 20.6 09/22/2010 0843   LDLCALC 37 05/10/2017 0832     Risk Assessment/Calculations:          Physical Exam:    VS:  BP (!) 140/56   Pulse (!) 57   Ht '5\' 2"'$  (1.575 m)   Wt 133 lb (60.3 kg)   SpO2 98%   BMI 24.33 kg/m     Wt Readings from Last 3 Encounters:  06/16/22 133 lb (60.3 kg)  01/20/21 134 lb (60.8 kg)  07/28/20 139 lb (63 kg)    GEN:  Well nourished, well developed in no acute distress HEENT: Normal NECK: No JVD; No carotid bruits LYMPHATICS: No lymphadenopathy CARDIAC: Frequent ectopy noted RRR, no murmurs, rubs, gallops RESPIRATORY:  Clear to auscultation without rales, wheezing or rhonchi  ABDOMEN: Soft, non-tender, non-distended MUSCULOSKELETAL:  No edema; No deformity  SKIN: Warm and dry NEUROLOGIC:  Alert and oriented x 3 PSYCHIATRIC:  Normal affect   ASSESSMENT:    1. Chest tightness   2.  Essential hypertension  3. Premature atrial contractions   4. Coronary artery disease involving native coronary artery of native heart with unstable angina pectoris ([place])   5. Aortic atherosclerosis ([place])   6. Tobacco abuse     PLAN:    In order of problems listed above:   Coronary artery disease involving native coronary artery of native heart with unstable angina pectoris ([place]) Heart catheterization 2019 showed moderate overall disease patent  LAD stent.  Continue with goal-directed medical therapy.  No changes made.  Continue with Crestor 20 mg a day high intensity dose prior LDL 36.  She is having some ongoing chest tightness intermittently.  With her known CAD, we will go ahead and check a nuclear stress test.  Aortic atherosclerosis ([place]) Continue with aspirin, Crestor, blood pressure control.  Excellent.  COPD (chronic obstructive pulmonary disease) (HCC) Recent head cold.  Continuing to monitor by Dr. Harrington Challenger.  Medications reviewed.  Tobacco abuse Continue to encourage tobacco cessation.  Lung cancer screening CT has been performed.  Showed coronary atherosclerosis as well as aortic atherosclerosis.  Hyperlipidemia Crestor 20 mg a day without any significant myalgias.  She does state that she has some muscle aches in the morning but these go away.  She was contemplating taking coenzyme Q 10.  This is fine if she would like to take it.  Prior LDL 36 excellent.  Continue with high intensity dose and she has underlying coronary artery disease prior stent.  Premature atrial contractions Noted again on ECG.  Monitor reviewed as above.  Overall benign.  Fairly frequent.  Heard on exam.  No atrial fibrillation.  Junctional rhythm seen at one point.     1 year    Medication Adjustments/Labs and Tests Ordered: Current medicines are reviewed at length with the patient today.  Concerns regarding medicines are outlined above.  Orders Placed This Encounter  Procedures   MYOCARDIAL  PERFUSION IMAGING   EKG 12-Lead    No orders of the defined types were placed in this encounter.    Patient Instructions  Medication Instructions:  The current medical regimen is effective;  continue present plan and medications.  *If you need a refill on your cardiac medications before your next appointment, please call your pharmacy*   Testing/Procedures: Your physician has requested that you have a lexiscan myoview. For further information please visit [email or web address]. Please follow instruction sheet, as given.  Follow-Up: At [facility], you and your health needs are our priority.  As part of our continuing mission to provide you with exceptional heart care, we have created designated Provider Care Teams.  These Care Teams include your primary Cardiologist (physician) and Advanced Practice Providers (APPs -  Physician Assistants and Nurse Practitioners) who all work together to provide you with the care you need, when you need it.  We recommend signing up for the patient portal called "MyChart".  Sign up information is provided on this After Visit Summary.  MyChart is used to connect with patients for Virtual Visits (Telemedicine).  Patients are able to view lab/test results, encounter notes, upcoming appointments, etc.  Non-urgent messages can be sent to your provider as well.   To learn more about what you can do with MyChart, go to [email or web address].    Your next appointment:   1 year(s)  Provider:   Candee Furbish, MD        Signed, Casey Furbish, MD  06/16/2022 11:00 AM    [place]

## 2022-06-19 DIAGNOSIS — R591 Generalized enlarged lymph nodes: Secondary | ICD-10-CM | POA: Diagnosis not present

## 2022-06-19 DIAGNOSIS — I700 Atherosclerosis of aorta: Secondary | ICD-10-CM | POA: Diagnosis not present

## 2022-06-19 DIAGNOSIS — I779 Disorder of arteries and arterioles, unspecified: Secondary | ICD-10-CM | POA: Diagnosis not present

## 2022-06-19 DIAGNOSIS — J449 Chronic obstructive pulmonary disease, unspecified: Secondary | ICD-10-CM | POA: Diagnosis not present

## 2022-06-19 DIAGNOSIS — R0609 Other forms of dyspnea: Secondary | ICD-10-CM | POA: Diagnosis not present

## 2022-06-19 DIAGNOSIS — Z6824 Body mass index (BMI) 24.0-24.9, adult: Secondary | ICD-10-CM | POA: Diagnosis not present

## 2022-06-19 DIAGNOSIS — H6980 Other specified disorders of Eustachian tube, unspecified ear: Secondary | ICD-10-CM | POA: Diagnosis not present

## 2022-06-19 DIAGNOSIS — F3181 Bipolar II disorder: Secondary | ICD-10-CM | POA: Diagnosis not present

## 2022-06-21 ENCOUNTER — Encounter (HOSPITAL_COMMUNITY): Payer: PPO

## 2022-06-26 ENCOUNTER — Telehealth (HOSPITAL_COMMUNITY): Payer: Self-pay

## 2022-06-26 DIAGNOSIS — J310 Chronic rhinitis: Secondary | ICD-10-CM | POA: Diagnosis not present

## 2022-06-26 DIAGNOSIS — R0982 Postnasal drip: Secondary | ICD-10-CM | POA: Diagnosis not present

## 2022-06-26 DIAGNOSIS — J342 Deviated nasal septum: Secondary | ICD-10-CM | POA: Diagnosis not present

## 2022-06-26 DIAGNOSIS — J343 Hypertrophy of nasal turbinates: Secondary | ICD-10-CM | POA: Diagnosis not present

## 2022-06-26 NOTE — Telephone Encounter (Signed)
Detailed instructions left on the patient's answering machine. Asked to call back with any questions. S.Chalmer Zheng EMTP 

## 2022-06-27 ENCOUNTER — Ambulatory Visit (HOSPITAL_COMMUNITY): Payer: PPO | Attending: Cardiovascular Disease

## 2022-06-27 DIAGNOSIS — R0789 Other chest pain: Secondary | ICD-10-CM | POA: Insufficient documentation

## 2022-06-27 LAB — MYOCARDIAL PERFUSION IMAGING
LV dias vol: 72 mL (ref 46–106)
LV sys vol: 23 mL
Nuc Stress EF: 69 %
Peak HR: 87 {beats}/min
Rest HR: 53 {beats}/min
Rest Nuclear Isotope Dose: 10.5 mCi
SDS: 3
SRS: 0
SSS: 3
ST Depression (mm): 0 mm
Stress Nuclear Isotope Dose: 32.3 mCi
TID: 1.03

## 2022-06-27 MED ORDER — REGADENOSON 0.4 MG/5ML IV SOLN
0.4000 mg | Freq: Once | INTRAVENOUS | Status: AC
  Administered 2022-06-27: 0.4 mg via INTRAVENOUS

## 2022-06-27 MED ORDER — TECHNETIUM TC 99M TETROFOSMIN IV KIT: 750.0000 | PACK | Freq: Once | INTRAVENOUS | Status: DC | PRN

## 2022-06-27 MED ORDER — TECHNETIUM TC 99M TETROFOSMIN IV KIT
32.3000 | PACK | Freq: Once | INTRAVENOUS | Status: AC | PRN
  Administered 2022-06-27: 32.3 via INTRAVENOUS

## 2022-06-27 MED ORDER — TECHNETIUM TC 99M TETROFOSMIN IV KIT
10.5000 | PACK | Freq: Once | INTRAVENOUS | Status: AC | PRN
  Administered 2022-06-27: 10.5 via INTRAVENOUS

## 2022-06-28 ENCOUNTER — Telehealth: Payer: Self-pay | Admitting: Cardiology

## 2022-06-28 NOTE — Telephone Encounter (Signed)
Reassuring stress test.  Low risk, no ischemia.  Continue with current management.  Candee Furbish, MD   Reviewed results with pt's husband who states understanding.

## 2022-06-28 NOTE — Telephone Encounter (Signed)
Pt's husband is requesting call back to discuss results.

## 2022-07-03 ENCOUNTER — Encounter (HOSPITAL_COMMUNITY): Payer: PPO

## 2022-09-03 ENCOUNTER — Other Ambulatory Visit: Payer: Self-pay | Admitting: Physician Assistant

## 2022-09-03 NOTE — Telephone Encounter (Signed)
Has appt 4/16

## 2022-09-05 ENCOUNTER — Ambulatory Visit: Payer: PPO | Admitting: Physician Assistant

## 2022-09-05 NOTE — Telephone Encounter (Signed)
Renu called this morning and cancelled the 4/16 appt and she couldn't see Rosey Bath today as she is not here. I r/s her for 10/03/22. Can her refill be called in now?  [id number] [place], [place] - [address]   Phone: [phone]  Fax: [phone]

## 2022-09-05 NOTE — Telephone Encounter (Signed)
Addendum to attached message. Since Casey Kirk is out sick today I've r/s her for 5/14. She needs a refill on her Trazodone called to:  [id number] [place], [place] - [address]   Phone: [phone]  Fax: [phone]

## 2022-10-03 ENCOUNTER — Ambulatory Visit: Payer: PPO | Admitting: Physician Assistant

## 2022-11-01 DIAGNOSIS — E559 Vitamin D deficiency, unspecified: Secondary | ICD-10-CM | POA: Diagnosis not present

## 2022-11-01 DIAGNOSIS — I10 Essential (primary) hypertension: Secondary | ICD-10-CM | POA: Diagnosis not present

## 2022-11-01 DIAGNOSIS — E7800 Pure hypercholesterolemia, unspecified: Secondary | ICD-10-CM | POA: Diagnosis not present

## 2022-11-07 ENCOUNTER — Ambulatory Visit: Payer: PPO | Admitting: Physician Assistant

## 2022-11-07 DIAGNOSIS — J383 Other diseases of vocal cords: Secondary | ICD-10-CM | POA: Diagnosis not present

## 2022-11-07 DIAGNOSIS — I700 Atherosclerosis of aorta: Secondary | ICD-10-CM | POA: Diagnosis not present

## 2022-11-07 DIAGNOSIS — E7800 Pure hypercholesterolemia, unspecified: Secondary | ICD-10-CM | POA: Diagnosis not present

## 2022-11-07 DIAGNOSIS — F411 Generalized anxiety disorder: Secondary | ICD-10-CM | POA: Diagnosis not present

## 2022-11-07 DIAGNOSIS — F3181 Bipolar II disorder: Secondary | ICD-10-CM | POA: Diagnosis not present

## 2022-11-07 DIAGNOSIS — I779 Disorder of arteries and arterioles, unspecified: Secondary | ICD-10-CM | POA: Diagnosis not present

## 2022-11-07 DIAGNOSIS — J449 Chronic obstructive pulmonary disease, unspecified: Secondary | ICD-10-CM | POA: Diagnosis not present

## 2022-11-07 DIAGNOSIS — M810 Age-related osteoporosis without current pathological fracture: Secondary | ICD-10-CM | POA: Diagnosis not present

## 2022-11-07 DIAGNOSIS — R251 Tremor, unspecified: Secondary | ICD-10-CM | POA: Diagnosis not present

## 2022-11-07 DIAGNOSIS — Z0000 Encounter for general adult medical examination without abnormal findings: Secondary | ICD-10-CM | POA: Diagnosis not present

## 2022-11-07 DIAGNOSIS — I10 Essential (primary) hypertension: Secondary | ICD-10-CM | POA: Diagnosis not present

## 2022-11-07 DIAGNOSIS — I2510 Atherosclerotic heart disease of native coronary artery without angina pectoris: Secondary | ICD-10-CM | POA: Diagnosis not present

## 2022-11-13 ENCOUNTER — Ambulatory Visit (INDEPENDENT_AMBULATORY_CARE_PROVIDER_SITE_OTHER): Payer: PPO | Admitting: Physician Assistant

## 2022-11-13 ENCOUNTER — Encounter: Payer: Self-pay | Admitting: Physician Assistant

## 2022-11-13 DIAGNOSIS — F17200 Nicotine dependence, unspecified, uncomplicated: Secondary | ICD-10-CM

## 2022-11-13 DIAGNOSIS — F99 Mental disorder, not otherwise specified: Secondary | ICD-10-CM | POA: Diagnosis not present

## 2022-11-13 DIAGNOSIS — F5105 Insomnia due to other mental disorder: Secondary | ICD-10-CM | POA: Diagnosis not present

## 2022-11-13 MED ORDER — TRAZODONE HCL 50 MG PO TABS: 25.0000 mg | ORAL_TABLET | Freq: Every evening | ORAL | 3 refills | Status: DC | PRN

## 2022-11-13 NOTE — Progress Notes (Signed)
[facility]  Patient ID: Casey Kirk,  MRN: [id number]  PCP: Daisy Floro, MD  Date of Evaluation: 11/13/2022 Time spent:20 minutes  Chief Complaint:  Chief Complaint   Follow-up    HISTORY/CURRENT STATUS: HPI For routine med check.  Shiana is doing well as far as her mental health goes.  She is able to sleep as long as she takes the melatonin and trazodone.  She is able to fall asleep and stay asleep.  She feels rested when she wakes up.  No anxiety to speak of.    Patient is able to enjoy things.  Energy and motivation are good.  She stays busy spending time with her family.   No extreme sadness, tearfulness, or feelings of hopelessness.  ADLs and personal hygiene are normal.   Denies any changes in concentration, making decisions, or remembering things.  Appetite has not changed.  Weight is stable.  Denies suicidal or homicidal thoughts.  Denies dizziness, syncope, seizures, numbness, tingling, tremor, tics, unsteady gait, slurred speech, confusion. Denies muscle or joint pain, stiffness, or dystonia.  Individual Medical History/ Review of Systems: Changes? :Yes is getting hearing aids next week. COPD is stable.  Past medications for mental health diagnoses include: Lamictal, Trazodone  Allergies: Hydrocodone-acetaminophen, Latex, Pneumococcal polysaccharide vaccine, and Hydrocodone  Current Medications:  Current Outpatient Medications:    acetaminophen (TYLENOL) 500 MG tablet, Take 500 mg by mouth every 8 (eight) hours as needed (pain)., Disp: , Rfl:    albuterol (PROVENTIL HFA;VENTOLIN HFA) 108 (90 Base) MCG/ACT inhaler, Inhale 2 puffs into the lungs every 6 (six) hours as needed for wheezing or shortness of breath., Disp: 1 Inhaler, Rfl: 5   aspirin EC 81 MG tablet, Take 81 mg by mouth daily., Disp: , Rfl:    CALCIUM/MAGNESIUM/ZINC FORMULA PO, Take 1 tablet by mouth 3 (three) times a week., Disp: , Rfl:    cholecalciferol (VITAMIN D) 1000 units tablet,  Take 1,000 Units by mouth daily., Disp: , Rfl:    conjugated estrogens (PREMARIN) vaginal cream, as needed (dryness)., Disp: , Rfl:    diphenhydrAMINE (BENADRYL) 25 mg capsule, Take 25 mg by mouth daily as needed for allergies., Disp: , Rfl:    diphenoxylate-atropine (LOMOTIL) 2.5-0.025 MG tablet, TAKE 2 TABLETS BY MOUTH AS NEEDED 4 TIMES A DAY, Disp: , Rfl:    fish oil-omega-3 fatty acids 1000 MG capsule, Take 1 g by mouth 2 (two) times a week. qod, Disp: , Rfl:    fluticasone (FLONASE) 50 MCG/ACT nasal spray, Place 2 sprays into both nostrils daily as needed for allergies or rhinitis., Disp: , Rfl:    guaiFENesin (MUCINEX) 600 MG 12 hr tablet, as needed for cough or to loosen phlegm., Disp: , Rfl:    Melatonin 3 MG TABS, Take 3 mg by mouth at bedtime., Disp: , Rfl:    metoprolol succinate (TOPROL-XL) 25 MG 24 hr tablet, Take 25 mg by mouth daily., Disp: , Rfl:    Misc Natural Products (TURMERIC CURCUMIN) CAPS, daily at 6 (six) AM., Disp: , Rfl:    Multiple Vitamin (MULTIVITAMIN) tablet, Take 1 tablet by mouth daily., Disp: , Rfl:    nicotine polacrilex (NICORETTE) 4 MG gum, Take 4 mg by mouth as needed for smoking cessation., Disp: , Rfl:    nitroGLYCERIN (NITROSTAT) 0.4 MG SL tablet, every 5 (five) minutes as needed for chest pain., Disp: , Rfl:    rosuvastatin (CRESTOR) 20 MG tablet, Take 20 mg by mouth daily. , Disp: , Rfl:  triamcinolone (KENALOG) 0.1 % paste, as needed., Disp: , Rfl:    Turmeric Curcumin 500 MG CAPS, Take 1 capsule by mouth every other day., Disp: , Rfl:    vitamin C (ASCORBIC ACID) 500 MG tablet, Take 500 mg by mouth daily., Disp: , Rfl:    vitamin E 100 UNIT capsule, Take 100 Units by mouth daily. , Disp: , Rfl:    traZODone (DESYREL) 50 MG tablet, Take 0.5-1 tablets (25-50 mg total) by mouth at bedtime as needed., Disp: 90 tablet, Rfl: 3 Medication Side Effects: none  Family Medical/ Social History: Changes? None  MENTAL HEALTH EXAM:  There were no vitals taken  for this visit.There is no height or weight on file to calculate BMI.  General Appearance: Casual, Neat and Well Groomed  Eye Contact:  Good  Speech:  Clear and Coherent and voice is tremulous  Volume:  Normal  Mood:  Euthymic  Affect:  Appropriate  Thought Process:  Goal Directed and Descriptions of Associations: Circumstantial  Orientation:  Full (Time, Place, and Person)  Thought Content: Logical   Suicidal Thoughts:  No  Homicidal Thoughts:  No  Memory:  WNL  Judgement:  Good  Insight:  Good  Psychomotor Activity:  Normal  Concentration:  Concentration: Good  Recall:  Good  Fund of Knowledge: Good  Language: Good  Assets:  Desire for Improvement  ADL's:  Intact  Cognition: WNL  Prognosis:  Good   DIAGNOSES:    ICD-10-CM   1. Insomnia due to other mental disorder  F51.05    F99     2. Smoker  F17.200       Receiving Psychotherapy: No   RECOMMENDATIONS:  PDMP was reviewed.  No controlled substances listed. I provided 20 minutes of face to face time during this encounter, including time spent before and after the visit in records review, medical decision making, counseling pertinent to today's visit, and charting.   She is doing well so no changes need to be made. Smoking cessation briefly discussed.  Continue trazodone 50 mg, 1/2-1 p.o. nightly as needed. Continue melatonin 3 mg 1 p.o. nightly as needed. Return in 1 year.  Melony Overly, PA-C

## 2022-11-14 DIAGNOSIS — F17200 Nicotine dependence, unspecified, uncomplicated: Secondary | ICD-10-CM | POA: Diagnosis not present

## 2022-11-14 DIAGNOSIS — N958 Other specified menopausal and perimenopausal disorders: Secondary | ICD-10-CM | POA: Diagnosis not present

## 2022-11-14 DIAGNOSIS — Z01419 Encounter for gynecological examination (general) (routine) without abnormal findings: Secondary | ICD-10-CM | POA: Diagnosis not present

## 2022-11-14 DIAGNOSIS — N952 Postmenopausal atrophic vaginitis: Secondary | ICD-10-CM | POA: Diagnosis not present

## 2022-11-14 DIAGNOSIS — Z6823 Body mass index (BMI) 23.0-23.9, adult: Secondary | ICD-10-CM | POA: Diagnosis not present

## 2022-11-14 DIAGNOSIS — L900 Lichen sclerosus et atrophicus: Secondary | ICD-10-CM | POA: Diagnosis not present

## 2022-11-14 DIAGNOSIS — F17210 Nicotine dependence, cigarettes, uncomplicated: Secondary | ICD-10-CM | POA: Diagnosis not present

## 2022-11-14 DIAGNOSIS — Z8262 Family history of osteoporosis: Secondary | ICD-10-CM | POA: Diagnosis not present

## 2022-11-14 DIAGNOSIS — M816 Localized osteoporosis [Lequesne]: Secondary | ICD-10-CM | POA: Diagnosis not present

## 2022-11-17 ENCOUNTER — Other Ambulatory Visit: Payer: Self-pay | Admitting: *Deleted

## 2022-11-17 ENCOUNTER — Encounter: Payer: Self-pay | Admitting: Physician Assistant

## 2022-11-17 DIAGNOSIS — R209 Unspecified disturbances of skin sensation: Secondary | ICD-10-CM

## 2022-11-29 DIAGNOSIS — H903 Sensorineural hearing loss, bilateral: Secondary | ICD-10-CM | POA: Diagnosis not present

## 2022-12-04 ENCOUNTER — Ambulatory Visit (HOSPITAL_COMMUNITY)
Admission: RE | Admit: 2022-12-04 | Discharge: 2022-12-04 | Disposition: A | Discharge status: Home / Self Care | Payer: PPO | Source: Ambulatory Visit | Attending: Surgery | Admitting: Surgery

## 2022-12-04 DIAGNOSIS — R209 Unspecified disturbances of skin sensation: Secondary | ICD-10-CM | POA: Diagnosis not present

## 2022-12-04 LAB — VAS US ABI WITH/WO TBI
Left ABI: 1.05
Right ABI: 1.04

## 2022-12-07 NOTE — Progress Notes (Signed)
Office Note     CC:  PAD evaluation  Requesting Provider:  Daisy Floro, MD  HPI: Casey Kirk is a 77 y.o. (November 26, 1945) female presenting at the request of .Casey Floro, MD for burning hands and feet most appreciated at night. Pt with history of coronary stents. No previous vascular surgery evaluation.   On exam today, Casey Kirk was doing well. A native of [place] she graduated from [employer]. She is married with two children and multiple grandchildren. Now retired she continues to live an active lifestyle.   For quite some time [place] has appreciated erythema, induration and burning sensation in the hands an feet on a nightly basis. No new medications. Denies rest pain, tissue loss. No symptoms consistent with claudication. The burning, swelling and redness dissipate by morning. During the day she notices her hand constantly feel cold. No color changes.   Past Medical History:  Diagnosis Date   Allergic rhinitis    Aortic atherosclerosis (HCC) 08/17/2016   Arthritis    ARTHRITIS, HX OF 01/09/2007   BIPOLAR II DISORDER 03/17/2008   Carcinoma of vaginal vault (HCC)    COPD 12/21/2006   COPD (chronic obstructive pulmonary disease) (HCC) 12/21/2006   Emphysema on CT 06/2016 02/2016 FEV1 of 98%     Coronary artery calcification 08/17/2016   Coronary artery disease involving native coronary artery of native heart with unstable angina pectoris (HCC)    Dysphasia 05/10/2011   Family history of early CAD 08/17/2016   GERD (gastroesophageal reflux disease)    GOUT 01/09/2007   Hx of colonic polyps    Hypothyroidism 10/17/2010   IBS (irritable bowel syndrome)    Osteoporosis    Sinusitis    Thyroid disease    TINEA CORPORIS 10/06/2009   Tobacco abuse 05/10/2011    Past Surgical History:  Procedure Laterality Date   ABDOMINAL HYSTERECTOMY     CA cells removed end of vagina   BREAST SURGERY     biopsy   COLONOSCOPY     CORONARY PRESSURE/FFR STUDY N/A 03/16/2017   Procedure:  INTRAVASCULAR PRESSURE WIRE/FFR STUDY;  Surgeon: Kathleene Hazel, MD;  Location: [facility];  Service: Cardiovascular;  Laterality: N/A;   CORONARY STENT INTERVENTION N/A 03/16/2017   Procedure: CORONARY STENT INTERVENTION;  Surgeon: Kathleene Hazel, MD;  Location: [facility];  Service: Cardiovascular;  Laterality: N/A;   EYE SURGERY Right    cataract   LEFT HEART CATH AND CORONARY ANGIOGRAPHY N/A 03/16/2017   Procedure: LEFT HEART CATH AND CORONARY ANGIOGRAPHY;  Surgeon: Kathleene Hazel, MD;  Location: [facility];  Service: Cardiovascular;  Laterality: N/A;   LEFT HEART CATH AND CORONARY ANGIOGRAPHY N/A 01/30/2018   Procedure: LEFT HEART CATH AND CORONARY ANGIOGRAPHY;  Surgeon: Kathleene Hazel, MD;  Location: [facility];  Service: Cardiovascular;  Laterality: N/A;   MASS EXCISION Right 05/10/2018   Procedure: EXCISION MASS OF RIGHT PALATAL  WITH LOCAL SOFT TISSUE RECONSTRUCTION;  Surgeon: Osborn Coho, MD;  Location: [facility] OR;  Service: ENT;  Laterality: Right;   TOOTH EXTRACTION     vaginal carcinoma surgery      Social History   Socioeconomic History   Marital status: Married    Spouse name: Not on file   Number of children: 2   Years of education: Not on file   Highest education level: Not on file  Occupational History   Occupation: retired  Tobacco Use   Smoking status: Every Day  Current packs/day: 0.20    Average packs/day: 0.2 packs/day for 40.0 years (8.0 ttl pk-yrs)    Types: Cigarettes   Smokeless tobacco: Never  Vaping Use   Vaping status: Never Used  Substance and Sexual Activity   Alcohol use: Not Currently    Comment: Rarely    Drug use: No   Sexual activity: Yes  Other Topics Concern   Not on file  Social History Narrative   Lives alone.  She has two grown children.   She is retired from working at [employer].   Highest level of education:  2 year business college   Social Determinants of Health    Financial Resource Strain: Not on file  Food Insecurity: Not on file  Transportation Needs: Not on file  Physical Activity: Not on file  Stress: Not on file  Social Connections: Not on file  Intimate Partner Violence: Not on file   Family History  Problem Relation Age of Onset   Alcohol abuse Other    Arthritis Other    Diabetes Other    Hyperlipidemia Other    Hypertension Other    Cancer Other        ovarian, uterine   Heart disease Other    Sudden death Other    Colon polyps Mother    Tremor Mother    COPD Mother    Colon polyps Brother    Heart attack Brother 6   Colon cancer Neg Hx     Current Outpatient Medications  Medication Sig Dispense Refill   acetaminophen (TYLENOL) 500 MG tablet Take 500 mg by mouth every 8 (eight) hours as needed (pain).     albuterol (PROVENTIL HFA;VENTOLIN HFA) 108 (90 Base) MCG/ACT inhaler Inhale 2 puffs into the lungs every 6 (six) hours as needed for wheezing or shortness of breath. 1 Inhaler 5   aspirin EC 81 MG tablet Take 81 mg by mouth daily.     CALCIUM/MAGNESIUM/ZINC FORMULA PO Take 1 tablet by mouth 3 (three) times a week.     cholecalciferol (VITAMIN D) 1000 units tablet Take 1,000 Units by mouth daily.     conjugated estrogens (PREMARIN) vaginal cream as needed (dryness).     diphenhydrAMINE (BENADRYL) 25 mg capsule Take 25 mg by mouth daily as needed for allergies.     diphenoxylate-atropine (LOMOTIL) 2.5-0.025 MG tablet TAKE 2 TABLETS BY MOUTH AS NEEDED 4 TIMES A DAY     fish oil-omega-3 fatty acids 1000 MG capsule Take 1 g by mouth 2 (two) times a week. qod     fluticasone (FLONASE) 50 MCG/ACT nasal spray Place 2 sprays into both nostrils daily as needed for allergies or rhinitis.     guaiFENesin (MUCINEX) 600 MG 12 hr tablet as needed for cough or to loosen phlegm.     Melatonin 3 MG TABS Take 3 mg by mouth at bedtime.     metoprolol succinate (TOPROL-XL) 25 MG 24 hr tablet Take 25 mg by mouth daily.     Misc Natural  Products (TURMERIC CURCUMIN) CAPS daily at 6 (six) AM.     Multiple Vitamin (MULTIVITAMIN) tablet Take 1 tablet by mouth daily.     nicotine polacrilex (NICORETTE) 4 MG gum Take 4 mg by mouth as needed for smoking cessation.     nitroGLYCERIN (NITROSTAT) 0.4 MG SL tablet every 5 (five) minutes as needed for chest pain.     rosuvastatin (CRESTOR) 20 MG tablet Take 20 mg by mouth daily.      traZODone (  DESYREL) 50 MG tablet Take 0.5-1 tablets (25-50 mg total) by mouth at bedtime as needed. 90 tablet 3   triamcinolone (KENALOG) 0.1 % paste as needed.     Turmeric Curcumin 500 MG CAPS Take 1 capsule by mouth every other day.     vitamin C (ASCORBIC ACID) 500 MG tablet Take 500 mg by mouth daily.     vitamin E 100 UNIT capsule Take 100 Units by mouth daily.      No current facility-administered medications for this visit.    Allergies  Allergen Reactions   Hydrocodone-Acetaminophen Itching and Swelling   Latex Itching and Swelling   Pneumococcal Polysaccharide Vaccine Swelling    Fever   Hydrocodone Swelling    Other Reaction(s): Not available     REVIEW OF SYSTEMS:  [X]  denotes positive finding, [ ]  denotes negative finding Cardiac  Comments:  Chest pain or chest pressure:    Shortness of breath upon exertion:    Short of breath when lying flat:    Irregular heart rhythm:        Vascular    Pain in calf, thigh, or hip brought on by ambulation:    Pain in feet at night that wakes you up from your sleep:     Blood clot in your veins:    Leg swelling:         Pulmonary    Oxygen at home:    Productive cough:     Wheezing:         Neurologic    Sudden weakness in arms or legs:     Sudden numbness in arms or legs:     Sudden onset of difficulty speaking or slurred speech:    Temporary loss of vision in one eye:     Problems with dizziness:         Gastrointestinal    Blood in stool:     Vomited blood:         Genitourinary    Burning when urinating:     Blood in urine:         Psychiatric    Major depression:         Hematologic    Bleeding problems:    Problems with blood clotting too easily:        Skin    Rashes or ulcers:        Constitutional    Fever or chills:      PHYSICAL EXAMINATION:  There were no vitals filed for this visit.  General:  WDWN in NAD; vital signs documented above Gait: Not observed HENT: WNL, normocephalic Pulmonary: normal non-labored breathing , without wheezing Cardiac: regular HR Abdomen: soft, NT, no masses Skin: without rashes Vascular Exam/Pulses:  Right Left  Radial 2+ (normal) 2+ (normal)  Ulnar    Femoral    Popliteal    DP 2+ (normal) 2+ (normal)  PT     Extremities: without ischemic changes, without Gangrene , without cellulitis; without open wounds;  Musculoskeletal: no muscle wasting or atrophy  Neurologic: A&O X 3;  No focal weakness or paresthesias are detected Psychiatric:  The pt has Normal affect.   Non-Invasive Vascular Imaging:     ABI Findings:  +---------+------------------+-----+-----------+--------+  Right   Rt Pressure (mmHg)IndexWaveform   Comment   +---------+------------------+-----+-----------+--------+  Brachial 168                                         +---------+------------------+-----+-----------+--------+  PTA     174               1.04 multiphasic          +---------+------------------+-----+-----------+--------+  DP      170               1.01 multiphasic          +---------+------------------+-----+-----------+--------+  Great Toe116               0.69 Abnormal             +---------+------------------+-----+-----------+--------+   +---------+------------------+-----+-----------+-------+  Left    Lt Pressure (mmHg)IndexWaveform   Comment  +---------+------------------+-----+-----------+-------+  Brachial 164                                        +---------+------------------+-----+-----------+-------+  PTA      161               0.96 multiphasic         +---------+------------------+-----+-----------+-------+  DP      176               1.05 multiphasic         +---------+------------------+-----+-----------+-------+  Great Toe128               0.76 Normal              +---------+------------------+-----+-----------+-------+   +-------+-----------+-----------+------------+------------+  ABI/TBIToday's ABIToday's TBIPrevious ABIPrevious TBI  +-------+-----------+-----------+------------+------------+  Right 1.04       0.69       1.08                      +-------+-----------+-----------+------------+------------+  Left  1.05       0.76       1.05                      +-------+-----------+-----------+------------+------------+       ASSESSMENT/PLAN: TANEKIA RYANS is a 77 y.o. female presenting with erythema, induration and burning sensation in the hands an feet on a nightly basis.  On physical exam she had palpable pulses in the hands and feet. Abi were normal.  I assessed her medication list - these symptoms could be a side effect of trazodone which can act a a vasodilator. She takes this medication in the evening, stating it helps her sleep. Interestingly this timing is consistent with her symptoms.  The cold sensation may be attributed to when the medication wears off and vasodilation ends.   No vascular or venous etiology. Symptoms are not consistent with Raynaud's.  Recommend attempting to lower the trazodone dose or discontinuing it to see if it is the culprit.    Victorino Sparrow, MD [facility] [phone]

## 2022-12-08 ENCOUNTER — Encounter: Payer: Self-pay | Admitting: Vascular Surgery

## 2022-12-08 ENCOUNTER — Ambulatory Visit (INDEPENDENT_AMBULATORY_CARE_PROVIDER_SITE_OTHER): Payer: PPO | Admitting: Vascular Surgery

## 2022-12-08 VITALS — Resp 14 | Ht 62.0 in | Wt 131.0 lb

## 2022-12-08 DIAGNOSIS — M7989 Other specified soft tissue disorders: Secondary | ICD-10-CM | POA: Diagnosis not present

## 2022-12-08 DIAGNOSIS — R238 Other skin changes: Secondary | ICD-10-CM

## 2022-12-08 DIAGNOSIS — M25472 Effusion, left ankle: Secondary | ICD-10-CM | POA: Diagnosis not present

## 2022-12-08 DIAGNOSIS — M25471 Effusion, right ankle: Secondary | ICD-10-CM

## 2022-12-13 DIAGNOSIS — H524 Presbyopia: Secondary | ICD-10-CM | POA: Diagnosis not present

## 2022-12-13 DIAGNOSIS — H26493 Other secondary cataract, bilateral: Secondary | ICD-10-CM | POA: Diagnosis not present

## 2022-12-13 DIAGNOSIS — H0100B Unspecified blepharitis left eye, upper and lower eyelids: Secondary | ICD-10-CM | POA: Diagnosis not present

## 2022-12-13 DIAGNOSIS — H04123 Dry eye syndrome of bilateral lacrimal glands: Secondary | ICD-10-CM | POA: Diagnosis not present

## 2022-12-13 DIAGNOSIS — H0100A Unspecified blepharitis right eye, upper and lower eyelids: Secondary | ICD-10-CM | POA: Diagnosis not present

## 2022-12-13 DIAGNOSIS — H43813 Vitreous degeneration, bilateral: Secondary | ICD-10-CM | POA: Diagnosis not present

## 2022-12-13 DIAGNOSIS — H52201 Unspecified astigmatism, right eye: Secondary | ICD-10-CM | POA: Diagnosis not present

## 2023-01-11 DIAGNOSIS — Z6824 Body mass index (BMI) 24.0-24.9, adult: Secondary | ICD-10-CM | POA: Diagnosis not present

## 2023-01-11 DIAGNOSIS — U071 COVID-19: Secondary | ICD-10-CM | POA: Diagnosis not present

## 2023-01-11 DIAGNOSIS — J069 Acute upper respiratory infection, unspecified: Secondary | ICD-10-CM | POA: Diagnosis not present

## 2023-01-17 DIAGNOSIS — Z6823 Body mass index (BMI) 23.0-23.9, adult: Secondary | ICD-10-CM | POA: Diagnosis not present

## 2023-01-17 DIAGNOSIS — J441 Chronic obstructive pulmonary disease with (acute) exacerbation: Secondary | ICD-10-CM | POA: Diagnosis not present

## 2023-03-07 DIAGNOSIS — L821 Other seborrheic keratosis: Secondary | ICD-10-CM | POA: Diagnosis not present

## 2023-03-07 DIAGNOSIS — L570 Actinic keratosis: Secondary | ICD-10-CM | POA: Diagnosis not present

## 2023-03-07 DIAGNOSIS — D1801 Hemangioma of skin and subcutaneous tissue: Secondary | ICD-10-CM | POA: Diagnosis not present

## 2023-03-07 DIAGNOSIS — D0439 Carcinoma in situ of skin of other parts of face: Secondary | ICD-10-CM | POA: Diagnosis not present

## 2023-03-07 DIAGNOSIS — D692 Other nonthrombocytopenic purpura: Secondary | ICD-10-CM | POA: Diagnosis not present

## 2023-03-13 DIAGNOSIS — J449 Chronic obstructive pulmonary disease, unspecified: Secondary | ICD-10-CM | POA: Diagnosis not present

## 2023-03-13 DIAGNOSIS — R251 Tremor, unspecified: Secondary | ICD-10-CM | POA: Diagnosis not present

## 2023-03-13 DIAGNOSIS — R599 Enlarged lymph nodes, unspecified: Secondary | ICD-10-CM | POA: Diagnosis not present

## 2023-03-13 DIAGNOSIS — R451 Restlessness and agitation: Secondary | ICD-10-CM | POA: Diagnosis not present

## 2023-03-14 ENCOUNTER — Encounter (INDEPENDENT_AMBULATORY_CARE_PROVIDER_SITE_OTHER): Payer: Self-pay

## 2023-03-28 DIAGNOSIS — Z1231 Encounter for screening mammogram for malignant neoplasm of breast: Secondary | ICD-10-CM | POA: Diagnosis not present

## 2023-04-05 ENCOUNTER — Institutional Professional Consult (permissible substitution) (INDEPENDENT_AMBULATORY_CARE_PROVIDER_SITE_OTHER): Payer: PPO

## 2023-04-10 DIAGNOSIS — N6321 Unspecified lump in the left breast, upper outer quadrant: Secondary | ICD-10-CM | POA: Diagnosis not present

## 2023-04-10 DIAGNOSIS — R928 Other abnormal and inconclusive findings on diagnostic imaging of breast: Secondary | ICD-10-CM | POA: Diagnosis not present

## 2023-04-11 DIAGNOSIS — C44329 Squamous cell carcinoma of skin of other parts of face: Secondary | ICD-10-CM | POA: Diagnosis not present

## 2023-04-16 ENCOUNTER — Other Ambulatory Visit: Payer: Self-pay | Admitting: Acute Care

## 2023-04-16 DIAGNOSIS — F17210 Nicotine dependence, cigarettes, uncomplicated: Secondary | ICD-10-CM

## 2023-04-16 DIAGNOSIS — Z122 Encounter for screening for malignant neoplasm of respiratory organs: Secondary | ICD-10-CM

## 2023-04-16 DIAGNOSIS — Z87891 Personal history of nicotine dependence: Secondary | ICD-10-CM

## 2023-04-25 ENCOUNTER — Other Ambulatory Visit: Payer: Self-pay | Admitting: Radiology

## 2023-04-25 DIAGNOSIS — D0512 Intraductal carcinoma in situ of left breast: Secondary | ICD-10-CM | POA: Diagnosis not present

## 2023-04-25 DIAGNOSIS — C50812 Malignant neoplasm of overlapping sites of left female breast: Secondary | ICD-10-CM | POA: Diagnosis not present

## 2023-04-26 ENCOUNTER — Institutional Professional Consult (permissible substitution) (INDEPENDENT_AMBULATORY_CARE_PROVIDER_SITE_OTHER): Payer: PPO

## 2023-04-26 LAB — SURGICAL PATHOLOGY

## 2023-04-27 ENCOUNTER — Telehealth: Payer: Self-pay | Admitting: Hematology and Oncology

## 2023-04-27 NOTE — Telephone Encounter (Signed)
Spoke to patient to confirm upcoming morning [facility] clinic appointment  on 12/18 paperwork will be sent via [employer].   Gave location and time, also informed patient that the surgeon's office would be calling as well to get information from them similar to the packet that they will be receiving so make sure to do both.  Reminded patient that all providers will be coming to the clinic to see them HERE and if they had any questions to not hesitate to reach back out to myself or their navigators.

## 2023-04-30 ENCOUNTER — Encounter: Payer: Self-pay | Admitting: Family Medicine

## 2023-05-02 ENCOUNTER — Ambulatory Visit: Payer: PPO | Admitting: Family Medicine

## 2023-05-04 ENCOUNTER — Telehealth: Payer: Self-pay | Admitting: Radiation Oncology

## 2023-05-04 ENCOUNTER — Telehealth: Payer: Self-pay | Admitting: *Deleted

## 2023-05-04 NOTE — Telephone Encounter (Signed)
Pt called with additional questions about upcoming visit for [facility]. Call transferred to [facility] for best answers.

## 2023-05-04 NOTE — Telephone Encounter (Signed)
Spoke with patient to clarify [facility] and answer questions.  Informed her to call back if she had any further questions.  Patient verbalized understanding.

## 2023-05-07 ENCOUNTER — Encounter: Payer: Self-pay | Admitting: *Deleted

## 2023-05-07 DIAGNOSIS — C50412 Malignant neoplasm of upper-outer quadrant of left female breast: Secondary | ICD-10-CM | POA: Insufficient documentation

## 2023-05-08 ENCOUNTER — Encounter: Payer: Self-pay | Admitting: Radiology

## 2023-05-09 ENCOUNTER — Telehealth: Payer: Self-pay | Admitting: *Deleted

## 2023-05-09 ENCOUNTER — Inpatient Hospital Stay: Payer: PPO | Admitting: Genetic Counselor

## 2023-05-09 ENCOUNTER — Encounter: Payer: Self-pay | Admitting: Hematology and Oncology

## 2023-05-09 ENCOUNTER — Encounter: Payer: Self-pay | Admitting: Genetic Counselor

## 2023-05-09 ENCOUNTER — Encounter: Payer: Self-pay | Admitting: *Deleted

## 2023-05-09 ENCOUNTER — Inpatient Hospital Stay: Payer: PPO | Admitting: Hematology and Oncology

## 2023-05-09 ENCOUNTER — Ambulatory Visit
Admission: RE | Admit: 2023-05-09 | Discharge: 2023-05-09 | Disposition: A | Discharge status: Home / Self Care | Payer: PPO | Source: Ambulatory Visit | Attending: Radiation Oncology | Admitting: Radiation Oncology

## 2023-05-09 ENCOUNTER — Inpatient Hospital Stay: Payer: PPO | Attending: Hematology and Oncology | Admitting: *Deleted

## 2023-05-09 ENCOUNTER — Ambulatory Visit: Payer: PPO | Admitting: Physical Therapy

## 2023-05-09 ENCOUNTER — Inpatient Hospital Stay: Payer: PPO | Admitting: Licensed Clinical Social Worker

## 2023-05-09 VITALS — BP 118/62 | HR 74 | Temp 98.3°F | Resp 17 | Wt 131.0 lb

## 2023-05-09 DIAGNOSIS — J449 Chronic obstructive pulmonary disease, unspecified: Secondary | ICD-10-CM | POA: Insufficient documentation

## 2023-05-09 DIAGNOSIS — M810 Age-related osteoporosis without current pathological fracture: Secondary | ICD-10-CM | POA: Insufficient documentation

## 2023-05-09 DIAGNOSIS — C50412 Malignant neoplasm of upper-outer quadrant of left female breast: Secondary | ICD-10-CM

## 2023-05-09 DIAGNOSIS — Z170 Estrogen receptor positive status [ER+]: Secondary | ICD-10-CM

## 2023-05-09 DIAGNOSIS — F17210 Nicotine dependence, cigarettes, uncomplicated: Secondary | ICD-10-CM | POA: Insufficient documentation

## 2023-05-09 DIAGNOSIS — E039 Hypothyroidism, unspecified: Secondary | ICD-10-CM | POA: Diagnosis not present

## 2023-05-09 DIAGNOSIS — I2510 Atherosclerotic heart disease of native coronary artery without angina pectoris: Secondary | ICD-10-CM

## 2023-05-09 DIAGNOSIS — Z800 Family history of malignant neoplasm of digestive organs: Secondary | ICD-10-CM

## 2023-05-09 DIAGNOSIS — Z955 Presence of coronary angioplasty implant and graft: Secondary | ICD-10-CM | POA: Insufficient documentation

## 2023-05-09 LAB — CMP (CANCER CENTER ONLY)
ALT: 13 U/L (ref 0–44)
AST: 15 U/L (ref 15–41)
Albumin: 4.5 g/dL (ref 3.5–5.0)
Alkaline Phosphatase: 70 U/L (ref 38–126)
Anion gap: 7 (ref 5–15)
BUN: 11 mg/dL (ref 8–23)
CO2: 30 mmol/L (ref 22–32)
Calcium: 9.5 mg/dL (ref 8.9–10.3)
Chloride: 104 mmol/L (ref 98–111)
Creatinine: 0.76 mg/dL (ref 0.44–1.00)
GFR, Estimated: >60 mL/min (ref >60)
Glucose, Bld: 86 mg/dL (ref 70–99)
Potassium: 4 mmol/L (ref 3.5–5.1)
Sodium: 141 mmol/L (ref 135–145)
Total Bilirubin: 0.6 mg/dL (ref <1.2)
Total Protein: 7.2 g/dL (ref 6.5–8.1)

## 2023-05-09 LAB — CBC WITH DIFFERENTIAL (CANCER CENTER ONLY)
Abs Immature Granulocytes: 0.03 10*3/uL (ref 0.00–0.07)
Basophils Absolute: 0.1 10*3/uL (ref 0.0–0.1)
Basophils Relative: 1 %
Eosinophils Absolute: 0.1 10*3/uL (ref 0.0–0.5)
Eosinophils Relative: 1 %
HCT: 46 % (ref 36.0–46.0)
Hemoglobin: 15.5 g/dL — ABNORMAL HIGH (ref 12.0–15.0)
Immature Granulocytes: 0 %
Lymphocytes Relative: 23 %
Lymphs Abs: 2.1 10*3/uL (ref 0.7–4.0)
MCH: 29.4 pg (ref 26.0–34.0)
MCHC: 33.7 g/dL (ref 30.0–36.0)
MCV: 87.3 fL (ref 80.0–100.0)
Monocytes Absolute: 0.7 10*3/uL (ref 0.1–1.0)
Monocytes Relative: 8 %
Neutro Abs: 6.2 10*3/uL (ref 1.7–7.7)
Neutrophils Relative %: 67 %
Platelet Count: 196 10*3/uL (ref 150–400)
RBC: 5.27 MIL/uL — ABNORMAL HIGH (ref 3.87–5.11)
RDW: 13.1 % (ref 11.5–15.5)
WBC Count: 9.2 10*3/uL (ref 4.0–10.5)
nRBC: 0 % (ref 0.0–0.2)

## 2023-05-09 NOTE — Assessment & Plan Note (Signed)
This is a very pleasant 77 year old female patient with newly diagnosed left breast grade 1 invasive ductal carcinoma, ER PR positive, HER2 0, Ki-67 of less than 1% referred to breast [facility] for additional recommendations.  Stage 1 Invasive Ductal Carcinoma (Left Breast) Small tumor (5mm) located at 3 o'clock position. Estrogen and progesterone receptor positive, HER2 negative. Discussed the curable nature of this early-stage cancer. -Plan for lumpectomy upfront.  No role for Oncotype DX if tumor is less than 5 mm.  -Post-surgery, initiate anastrozole (estrogen blocker) for 5 years. -Radiation therapy to be discussed with radiation oncologist, but may not be necessary given patient's age and early stage of cancer.  Osteoporosis Discussed potential impact of anastrozole on bone density. Noted need to review recent bone density scan results. -Consider alternative medication (tamoxifen) if osteoporosis is severe, as it does not affect bone density.  Chronic Obstructive Pulmonary Disease (COPD) Noted occasional use of inhalers. -Continue current management.  Coronary Artery Disease History of stent placement. -Continue current management.  Tobacco Use Long-term smoking history, recent attempts to reduce. -Encourage continued efforts to quit smoking.

## 2023-05-09 NOTE — Telephone Encounter (Signed)
   Pre-operative Risk Assessment    Patient Name: Casey Kirk  DOB: February 28, 1946 MRN: [id number]  DATE OF LAST VISIT: 06/16/22 DR. SKAINS DATE OF NEXT VISIT: 06/21/23 DR. SKAINS    Request for Surgical Clearance    Procedure:   LUMPECTOMY  Date of Surgery:  Clearance TBD                                 Surgeon:  DR. MATTHEW WAKEFIELD Surgeon's Group or Practice Name:  [facility] Phone number:  [phone] Fax number:  [phone] Ethlyn Gallery, CMA   Type of Clearance Requested:   - Medical  - Pharmacy:  [facility]     Type of Anesthesia:  General    Additional requests/questions:    Elpidio Anis   05/09/2023, 11:23 AM

## 2023-05-09 NOTE — Progress Notes (Signed)
[facility] CONSULT NOTE  Patient Care Team: Daisy Floro, MD as PCP - General (Family Medicine) Jake Bathe, MD as PCP - Cardiology (Cardiology) Pershing Proud, RN as Oncology Nurse Navigator Donnelly Angelica, RN as Oncology Nurse Navigator Emelia Loron, MD as Consulting Physician (General Surgery) Rachel Moulds, MD as Consulting Physician (Hematology and Oncology) Antony Blackbird, MD as Consulting Physician (Radiation Oncology)  CHIEF COMPLAINTS/PURPOSE OF CONSULTATION:  Newly diagnosed breast cancer  HISTORY OF PRESENTING ILLNESS:  Casey Kirk 77 y.o. female is here because of recent diagnosis of left breast cancer  I reviewed her records extensively and collaborated the history with the patient.  SUMMARY OF ONCOLOGIC HISTORY: Oncology History  Malignant neoplasm of upper-outer quadrant of left breast in female, estrogen receptor positive (HCC)  03/28/2023 Mammogram   Screening mammogram showed focal asymmetry in the left breast, indeterminate, additional views with possible ultrasound are recommended.  Diagnostic mammogram confirmed a 7 mm focal asymmetry in the left breast.   04/25/2023 Pathology Results   Left breast needle core biopsy, mass at 3:00 5 cm from the nipple showed overall grade 1 invasive ductal carcinoma, prognostic showed estrogen 95% positive strong staining, progesterone 100% positive strong staining, Ki-67 of less than 1% and HER2 0   05/07/2023 Initial Diagnosis   Malignant neoplasm of upper-outer quadrant of left breast in female, estrogen receptor positive (HCC)   05/09/2023 Cancer Staging   Staging form: Breast, AJCC 8th Edition - Clinical stage from 05/09/2023: Stage IA (cT1a, cN0, cM0, G1, ER+, PR+, HER2-) - Signed by Rachel Moulds, MD on 05/09/2023 Stage prefix: Initial diagnosis Histologic grading system: 3 grade system    The patient, a retired Conservation officer, historic buildings, presents with a recent diagnosis of early-stage  invasive ductal breast cancer in her left breast. The tumor, located at the 3 o'clock position, measures approximately 5 millimeters. The patient reports no family history of cancer. She has a history of coronary artery disease, for which she had a stent placed in 2018, and chronic obstructive pulmonary disease (COPD), which she manages with occasional use of inhalers. She also may have underlying osteoporosis, which is currently not being treated. The patient has been a smoker since she was 74 and has recently tried to cut down. She also reports a long-standing swelling in her neck, which she believes to be a lymph node or a salivary gland.  MEDICAL HISTORY:  Past Medical History:  Diagnosis Date   Allergic rhinitis    Aortic atherosclerosis (HCC) 08/17/2016   Arthritis    ARTHRITIS, HX OF 01/09/2007   BIPOLAR II DISORDER 03/17/2008   Breast cancer (HCC)    Carcinoma of vaginal vault (HCC)    COPD 12/21/2006   COPD (chronic obstructive pulmonary disease) (HCC) 12/21/2006   Emphysema on CT 06/2016 02/2016 FEV1 of 98%     Coronary artery calcification 08/17/2016   Coronary artery disease involving native coronary artery of native heart with unstable angina pectoris (HCC)    Dysphasia 05/10/2011   Family history of early CAD 08/17/2016   GERD (gastroesophageal reflux disease)    GOUT 01/09/2007   Hx of colonic polyps    Hypertension    Hypothyroidism 10/17/2010   IBS (irritable bowel syndrome)    Osteoporosis    Sinusitis    Thyroid disease    TINEA CORPORIS 10/06/2009   Tobacco abuse 05/10/2011    SURGICAL HISTORY: Past Surgical History:  Procedure Laterality Date   ABDOMINAL HYSTERECTOMY     CA  cells removed end of vagina   BREAST SURGERY     biopsy   COLONOSCOPY     CORONARY PRESSURE/FFR STUDY N/A 03/16/2017   Procedure: INTRAVASCULAR PRESSURE WIRE/FFR STUDY;  Surgeon: Kathleene Hazel, MD;  Location: [facility];  Service: Cardiovascular;  Laterality: N/A;    CORONARY STENT INTERVENTION N/A 03/16/2017   Procedure: CORONARY STENT INTERVENTION;  Surgeon: Kathleene Hazel, MD;  Location: [facility];  Service: Cardiovascular;  Laterality: N/A;   EYE SURGERY Right    cataract   LEFT HEART CATH AND CORONARY ANGIOGRAPHY N/A 03/16/2017   Procedure: LEFT HEART CATH AND CORONARY ANGIOGRAPHY;  Surgeon: Kathleene Hazel, MD;  Location: [facility];  Service: Cardiovascular;  Laterality: N/A;   LEFT HEART CATH AND CORONARY ANGIOGRAPHY N/A 01/30/2018   Procedure: LEFT HEART CATH AND CORONARY ANGIOGRAPHY;  Surgeon: Kathleene Hazel, MD;  Location: [facility];  Service: Cardiovascular;  Laterality: N/A;   MASS EXCISION Right 05/10/2018   Procedure: EXCISION MASS OF RIGHT PALATAL  WITH LOCAL SOFT TISSUE RECONSTRUCTION;  Surgeon: Osborn Coho, MD;  Location[facility];  Service: ENT;  Laterality: Right;   TOOTH EXTRACTION     vaginal carcinoma surgery      SOCIAL HISTORY: Social History   Socioeconomic History   Marital status: Married    Spouse name: Not on file   Number of children: 2   Years of education: Not on file   Highest education level: Not on file  Occupational History   Occupation: retired  Tobacco Use   Smoking status: Every Day    Current packs/day: 0.20    Average packs/day: 0.2 packs/day for 40.0 years (8.0 ttl pk-yrs)    Types: Cigarettes   Smokeless tobacco: Never  Vaping Use   Vaping status: Never Used  Substance and Sexual Activity   Alcohol use: Not Currently    Comment: Rarely    Drug use: No   Sexual activity: Yes  Other Topics Concern   Not on file  Social History Narrative   Lives alone.  She has two grown children.   She is retired from working at [employer].   Highest level of education:  2 year business college   Social Drivers of Corporate investment banker Strain: Not on file  Food Insecurity: Not on file  Transportation Needs: Not on file  Physical Activity: Not on file   Stress: Not on file  Social Connections: Not on file  Intimate Partner Violence: Not on file    FAMILY HISTORY: Family History  Problem Relation Age of Onset   Alcohol abuse Other    Arthritis Other    Diabetes Other    Hyperlipidemia Other    Hypertension Other    Cancer Other        ovarian, uterine   Heart disease Other    Sudden death Other    Colon polyps Mother    Tremor Mother    COPD Mother    Colon polyps Brother    Heart attack Brother 58   Colon cancer Neg Hx     ALLERGIES:  is allergic to hydrocodone-acetaminophen, latex, pneumococcal polysaccharide vaccine, and hydrocodone.  MEDICATIONS:  Current Outpatient Medications  Medication Sig Dispense Refill   acetaminophen (TYLENOL) 500 MG tablet Take 500 mg by mouth every 8 (eight) hours as needed (pain).     albuterol (PROVENTIL HFA;VENTOLIN HFA) 108 (90 Base) MCG/ACT inhaler Inhale 2 puffs into the lungs every 6 (  six) hours as needed for wheezing or shortness of breath. 1 Inhaler 5   aspirin EC 81 MG tablet Take 81 mg by mouth daily.     CALCIUM/MAGNESIUM/ZINC FORMULA PO Take 1 tablet by mouth 3 (three) times a week.     cholecalciferol (VITAMIN D) 1000 units tablet Take 1,000 Units by mouth daily.     conjugated estrogens (PREMARIN) vaginal cream as needed (dryness).     diphenhydrAMINE (BENADRYL) 25 mg capsule Take 25 mg by mouth daily as needed for allergies.     diphenoxylate-atropine (LOMOTIL) 2.5-0.025 MG tablet TAKE 2 TABLETS BY MOUTH AS NEEDED 4 TIMES A DAY     fish oil-omega-3 fatty acids 1000 MG capsule Take 1 g by mouth 2 (two) times a week. qod     fluticasone (FLONASE) 50 MCG/ACT nasal spray Place 2 sprays into both nostrils daily as needed for allergies or rhinitis.     guaiFENesin (MUCINEX) 600 MG 12 hr tablet as needed for cough or to loosen phlegm.     Melatonin 3 MG TABS Take 3 mg by mouth at bedtime.     metoprolol succinate (TOPROL-XL) 25 MG 24 hr tablet Take 25 mg by mouth daily.     Misc  Natural Products (TURMERIC CURCUMIN) CAPS daily at 6 (six) AM.     Multiple Vitamin (MULTIVITAMIN) tablet Take 1 tablet by mouth daily.     nicotine polacrilex (NICORETTE) 4 MG gum Take 4 mg by mouth as needed for smoking cessation.     nitroGLYCERIN (NITROSTAT) 0.4 MG SL tablet every 5 (five) minutes as needed for chest pain.     rosuvastatin (CRESTOR) 20 MG tablet Take 20 mg by mouth daily.      traZODone (DESYREL) 50 MG tablet Take 0.5-1 tablets (25-50 mg total) by mouth at bedtime as needed. 90 tablet 3   triamcinolone (KENALOG) 0.1 % paste as needed.     Turmeric Curcumin 500 MG CAPS Take 1 capsule by mouth every other day.     vitamin C (ASCORBIC ACID) 500 MG tablet Take 500 mg by mouth daily.     vitamin E 100 UNIT capsule Take 100 Units by mouth daily.      No current facility-administered medications for this visit.    REVIEW OF SYSTEMS:   Constitutional: Denies fevers, chills or abnormal night sweats Eyes: Denies blurriness of vision, double vision or watery eyes Ears, nose, mouth, throat, and face: Denies mucositis or sore throat Respiratory: Denies cough, dyspnea or wheezes Cardiovascular: Denies palpitation, chest discomfort or lower extremity swelling Gastrointestinal:  Denies nausea, heartburn or change in bowel habits Skin: Denies abnormal skin rashes Lymphatics: Denies new lymphadenopathy or easy bruising Neurological:Denies numbness, tingling or new weaknesses Behavioral/Psych: Mood is stable, no new changes  Breast: Denies any palpable lumps or discharge All other systems were reviewed with the patient and are negative.  PHYSICAL EXAMINATION: ECOG PERFORMANCE STATUS: 0 - Asymptomatic  Vitals:   05/09/23 0815  BP: 118/62  Pulse: 74  Resp: 17  Temp: 98.3 F (36.8 C)  SpO2: 100%   Filed Weights   05/09/23 0815  Weight: 131 lb (59.4 kg)    GENERAL:alert, no distress and comfortable, appears older than stated SKIN: skin color, texture, turgor are normal, no  rashes or significant lesions EYES: normal, conjunctiva are pink and non-injected, sclera clear OROPHARYNX:no exudate, no erythema and lips, buccal mucosa, and tongue normal  NECK: supple, thyroid normal size, non-tender, without nodularity LYMPH:  no palpable lymphadenopathy in the  cervical, axillary  LUNGS: clear to auscultation and percussion with normal breathing effort HEART: regular rate & rhythm and no murmurs and no lower extremity edema ABDOMEN:abdomen soft, non-tender and normal bowel sounds Musculoskeletal:no cyanosis of digits and no clubbing  PSYCH: alert & oriented x 3 with fluent speech NEURO: Her speech is a bit hard to understand.  She says, she has a speech disorder. BREAST: Left breast postbiopsy changes, no obvious palpable mass.  LABORATORY DATA:  I have reviewed the data as listed Lab Results  Component Value Date   WBC 10.1 11/27/2019   HGB 14.1 11/27/2019   HCT 43.8 11/27/2019   MCV 89 11/27/2019   PLT 177 11/27/2019   Lab Results  Component Value Date   NA 144 11/27/2019   K 4.0 11/27/2019   CL 106 11/27/2019   CO2 24 11/27/2019    RADIOGRAPHIC STUDIES: I have personally reviewed the radiological reports and agreed with the findings in the report.  ASSESSMENT AND PLAN:  Malignant neoplasm of upper-outer quadrant of left breast in female, estrogen receptor positive (HCC) This is a very pleasant 77 year old female patient with newly diagnosed left breast grade 1 invasive ductal carcinoma, ER PR positive, HER2 0, Ki-67 of less than 1% referred to breast [facility] for additional recommendations.  Stage 1 Invasive Ductal Carcinoma (Left Breast) Small tumor (5mm) located at 3 o'clock position. Estrogen and progesterone receptor positive, HER2 negative. Discussed the curable nature of this early-stage cancer. -Plan for lumpectomy upfront.  No role for Oncotype DX if tumor is less than 5 mm.  -Post-surgery, initiate anastrozole (estrogen blocker) for 5  years. -Radiation therapy to be discussed with radiation oncologist, but may not be necessary given patient's age and early stage of cancer.  Osteoporosis Discussed potential impact of anastrozole on bone density. Noted need to review recent bone density scan results. -Consider alternative medication (tamoxifen) if osteoporosis is severe, as it does not affect bone density.  Chronic Obstructive Pulmonary Disease (COPD) Noted occasional use of inhalers. -Continue current management.  Coronary Artery Disease History of stent placement. -Continue current management.  Tobacco Use Long-term smoking history, recent attempts to reduce. -Encourage continued efforts to quit smoking.   All questions were answered. The patient knows to call the clinic with any problems, questions or concerns.    Rachel Moulds, MD 05/09/23

## 2023-05-09 NOTE — Telephone Encounter (Signed)
   Name: Casey Kirk  DOB: 1945-07-30  MRN: [id number]  Primary Cardiologist: Donato Schultz, MD   Preoperative team, please contact this patient and set up a phone call appointment for further preoperative risk assessment. Please obtain consent and complete medication review. Thank you for your help.  I confirm that guidance regarding antiplatelet and oral anticoagulation therapy has been completed and, if necessary, noted below.  Per office protocol, if patient is without any new symptoms or concerns at the time of their virtual visit, she may hold Aspirin for 5-7 days prior to procedure. Please resume Aspirin as soon as possible postprocedure, at the discretion of the surgeon.    I also confirmed the patient resides in the state of [place]. As per [facility] Medical Board telemedicine laws, the patient must reside in the state in which the provider is licensed.   Joylene Grapes, NP 05/09/2023, 11:49 AM [facility]

## 2023-05-09 NOTE — Progress Notes (Signed)
[facility] Clinical Social Work  Initial Assessment   Casey Kirk is a 77 y.o. year old female contacted by phone. Clinical Social Work was referred by  [facility]  for assessment of psychosocial needs.   SDOH (Social Determinants of Health) assessments performed: Yes SDOH Interventions    Flowsheet Row Clinical Support from 05/09/2023 in [facility] - A Dept Of [employer]  SDOH Interventions   Food Insecurity Interventions Intervention Not Indicated  Housing Interventions Intervention Not Indicated  Transportation Interventions Intervention Not Indicated  Utilities Interventions Intervention Not Indicated       SDOH Screenings   Food Insecurity: No Food Insecurity (05/09/2023)  Housing: Unknown (05/09/2023)  Transportation Needs: No Transportation Needs (05/09/2023)  Utilities: Not At Risk (05/09/2023)  Depression (PHQ2-9): Low Risk  (05/09/2023)  Tobacco Use: High Risk (05/09/2023)   Received from [facility]     Distress Screen completed: No     No data to display            Family/Social Information:  Housing Arrangement: patient lives with husband, Casey Kirk) . They have 2 adult children, one in [place], one in [place] Family members/support persons in your life? Family Transportation concerns: no  Employment: Retired.  Income source: Actor concerns: No Type of concern: None Food access concerns: no Religious or spiritual practice: Not known Services Currently in place:  [employer]  Coping/ Adjustment to diagnosis: Patient understands treatment plan and what happens next? yes Patient reported stressors: minimal stress reported Current coping skills/ strengths: Supportive family/friends     SUMMARY: Current SDOH Barriers:  No major barriers noted today  Clinical Social Work Clinical Goal(s):  No clinical social work goals at this time  Interventions: Discussed  common feeling and emotions when being diagnosed with cancer, and the importance of support during treatment Informed patient of the support team roles and support services at [facility] Provided CSW contact information and encouraged patient to call with any questions or concerns Sent information on Breast cancer support group and added to reminder list per pt request   Follow Up Plan: Patient will contact CSW with any support or resource needs Patient verbalizes understanding of plan: Yes    Casey Kirk E Casey Cunanan, LCSW Clinical Social Worker [facility]

## 2023-05-09 NOTE — Research (Signed)
Trial:  [employer] 2021-05 - Specimen Collection Study to Evaluate Biomarkers in Subjects with Cancer   Patient Casey Kirk was identified by Dr. Al Pimple as a potential candidate for the above listed study.  This Clinical Research Nurse met with [name], [id number], on 05/09/23 in a manner and location that ensures patient privacy to discuss participation in the above listed research study.  Patient is Accompanied by her husband .  A copy of the informed consent document with embedded HIPAA language was provided to the patient.  Patient reads, speaks, and understands [place].   Patient was provided with the business card of this Nurse and encouraged to contact the research team with any questions.  Approximately 5 minutes were spent with the patient reviewing the informed consent documents.  Patient was provided the option of taking informed consent documents home to review and was encouraged to review at their convenience with their support network, including other care providers. Patient took the consent documents home to review. Patient agreed for research nurse to contact her on Friday 05/18/23 to follow up.  Domenica Reamer, BSN, RN, [employer] Clinical Research Nurse II [phone] 05/09/2023 12:28 PM

## 2023-05-09 NOTE — Progress Notes (Signed)
Radiation Oncology         [phone] ________________________________  Multidisciplinary Breast Oncology Clinic [facility]) Initial Outpatient Consultation  Name: Casey Kirk MRN: [id number]  Date: 05/09/2023  DOB: 02/10/1946  OZ:DGUY, Darlen Round, MD  Emelia Loron, MD   REFERRING PHYSICIAN: Emelia Loron, MD  DIAGNOSIS: The encounter diagnosis was Malignant neoplasm of upper-outer quadrant of left breast in female, estrogen receptor positive (HCC).  Stage IA (cT1a, cN0, cM0) Left Breast UOQ, Invasive Ductal Carcinoma, ER+ / PR+ / Her2-, Grade 1    ICD-10-CM   1. Malignant neoplasm of upper-outer quadrant of left breast in female, estrogen receptor positive (HCC)  C50.412    Z17.0       HISTORY OF PRESENT ILLNESS::Casey Kirk is a 77 y.o. female who is presenting to the office today for evaluation of her newly diagnosed breast cancer. She is accompanied by her husband. She is doing well overall.   She had routine screening mammography on 03/28/23 showing a possible abnormality in the left breast. She underwent left breast diagnostic mammography with tomography at [facility] on 04/10/23 which showed an indeterminate 7 mm focal asymmetry in the 3 o'clock left breast. Left breast ultrasound performed on that same date showed a 5 mm irregular mass in the 3 o'clock left breast, 5 cmfn, suspicious of malignancy. No evidence of left axillary lymphadenopathy was demonstrated.    Biopsy of the 3 o'clock left breast mass on 04/25/23 showed: grade 1 invasive ductal carcinoma measuring 5 mm in the greatest linear extent of the sample . Prognostic indicators significant for: estrogen receptor, 95% positive and progesterone receptor, 100% positive, both with strong staining intensity. Proliferation marker Ki67 at <1%. HER2 negative.  Menarche: 77 years old Age at first live birth: 77 years old GP: 2 LMP: postmenopausal (LMP date unknown) Contraceptive: yes, reported using BC pills  around 50 years ago HRT: never used   The patient was referred today for presentation in the multidisciplinary conference.  Radiology studies and pathology slides were presented there for review and discussion of treatment options.  A consensus was discussed regarding potential next steps.  PREVIOUS RADIATION THERAPY: No  PAST MEDICAL HISTORY:  Past Medical History:  Diagnosis Date   Allergic rhinitis    Aortic atherosclerosis (HCC) 08/17/2016   Arthritis    ARTHRITIS, HX OF 01/09/2007   BIPOLAR II DISORDER 03/17/2008   Breast cancer (HCC)    Carcinoma of vaginal vault (HCC)    COPD 12/21/2006   COPD (chronic obstructive pulmonary disease) (HCC) 12/21/2006   Emphysema on CT 06/2016 02/2016 FEV1 of 98%     Coronary artery calcification 08/17/2016   Coronary artery disease involving native coronary artery of native heart with unstable angina pectoris (HCC)    Dysphasia 05/10/2011   Family history of early CAD 08/17/2016   GERD (gastroesophageal reflux disease)    GOUT 01/09/2007   Hx of colonic polyps    Hypertension    Hypothyroidism 10/17/2010   IBS (irritable bowel syndrome)    Osteoporosis    Sinusitis    Thyroid disease    TINEA CORPORIS 10/06/2009   Tobacco abuse 05/10/2011    PAST SURGICAL HISTORY: Past Surgical History:  Procedure Laterality Date   ABDOMINAL HYSTERECTOMY     CA cells removed end of vagina   BREAST SURGERY     biopsy   COLONOSCOPY     CORONARY PRESSURE/FFR STUDY N/A 03/16/2017   Procedure: INTRAVASCULAR PRESSURE WIRE/FFR STUDY;  Surgeon: Kathleene Hazel, MD;  [facility];  Service: Cardiovascular;  Laterality: N/A;   CORONARY STENT INTERVENTION N/A 03/16/2017   Procedure: CORONARY STENT INTERVENTION;  Surgeon: Kathleene Hazel, MD;  Location: [facility];  Service: Cardiovascular;  Laterality: N/A;   EYE SURGERY Right    cataract   LEFT HEART CATH AND CORONARY ANGIOGRAPHY N/A 03/16/2017   Procedure: LEFT  HEART CATH AND CORONARY ANGIOGRAPHY;  Surgeon: Kathleene Hazel, MD;  [facility]: [facility];  Service: Cardiovascular;  Laterality: N/A;   LEFT HEART CATH AND CORONARY ANGIOGRAPHY N/A 01/30/2018   Procedure: LEFT HEART CATH AND CORONARY ANGIOGRAPHY;  Surgeon: Kathleene Hazel, MD;  [facility];  Service: Cardiovascular;  Laterality: N/A;   MASS EXCISION Right 05/10/2018   Procedure: EXCISION MASS OF RIGHT PALATAL  WITH LOCAL SOFT TISSUE RECONSTRUCTION;  Surgeon: Osborn Coho, MD;  Location: [facility];  Service: ENT;  Laterality: Right;   TOOTH EXTRACTION     vaginal carcinoma surgery      FAMILY HISTORY:  Family History  Problem Relation Age of Onset   Colon polyps Mother        unknown number   Tremor Mother    COPD Mother    Colon polyps Brother        unknown number   Heart attack Brother 68   Alcohol abuse Other    Arthritis Other    Diabetes Other    Hyperlipidemia Other    Hypertension Other    Heart disease Other    Sudden death Other    Pancreatic cancer Cousin        maternal female cousin   Colon cancer Neg Hx     SOCIAL HISTORY:  Social History   Socioeconomic History   Marital status: Married    Spouse name: Not on file   Number of children: 2   Years of education: Not on file   Highest education level: Not on file  Occupational History   Occupation: retired  Tobacco Use   Smoking status: Every Day    Current packs/day: 0.20    Average packs/day: 0.2 packs/day for 40.0 years (8.0 ttl pk-yrs)    Types: Cigarettes   Smokeless tobacco: Never  Vaping Use   Vaping status: Never Used  Substance and Sexual Activity   Alcohol use: Not Currently    Comment: Rarely    Drug use: No   Sexual activity: Yes  Other Topics Concern   Not on file  Social History Narrative   Lives alone.  She has two grown children.   She is retired from working at [employer].   Highest level of education:  2 year business college   Social Drivers  of Health   Financial Resource Strain: Not on file  Food Insecurity: No Food Insecurity (05/09/2023)   Hunger Vital Sign    Worried About Running Out of Food in the Last Year: Never true    Ran Out of Food in the Last Year: Never true  Transportation Needs: No Transportation Needs (05/09/2023)   PRAPARE - Administrator, Civil Service (Medical): No    Lack of Transportation (Non-Medical): No  Physical Activity: Not on file  Stress: Not on file  Social Connections: Not on file    ALLERGIES:  Allergies  Allergen Reactions   Hydrocodone-Acetaminophen Itching and Swelling   Latex Itching and Swelling   Pneumococcal Polysaccharide Vaccine Swelling    Fever   Hydrocodone Swelling  Other Reaction(s): Not available    MEDICATIONS:  Current Outpatient Medications  Medication Sig Dispense Refill   acetaminophen (TYLENOL) 500 MG tablet Take 500 mg by mouth every 8 (eight) hours as needed (pain).     albuterol (PROVENTIL HFA;VENTOLIN HFA) 108 (90 Base) MCG/ACT inhaler Inhale 2 puffs into the lungs every 6 (six) hours as needed for wheezing or shortness of breath. 1 Inhaler 5   aspirin EC 81 MG tablet Take 81 mg by mouth daily.     CALCIUM/MAGNESIUM/ZINC FORMULA PO Take 1 tablet by mouth 3 (three) times a week.     cholecalciferol (VITAMIN D) 1000 units tablet Take 1,000 Units by mouth daily.     conjugated estrogens (PREMARIN) vaginal cream as needed (dryness).     diphenhydrAMINE (BENADRYL) 25 mg capsule Take 25 mg by mouth daily as needed for allergies.     diphenoxylate-atropine (LOMOTIL) 2.5-0.025 MG tablet TAKE 2 TABLETS BY MOUTH AS NEEDED 4 TIMES A DAY     fish oil-omega-3 fatty acids 1000 MG capsule Take 1 g by mouth 2 (two) times a week. qod     fluticasone (FLONASE) 50 MCG/ACT nasal spray Place 2 sprays into both nostrils daily as needed for allergies or rhinitis.     guaiFENesin (MUCINEX) 600 MG 12 hr tablet as needed for cough or to loosen phlegm.     Melatonin 3  MG TABS Take 3 mg by mouth at bedtime.     metoprolol succinate (TOPROL-XL) 25 MG 24 hr tablet Take 25 mg by mouth daily.     Misc Natural Products (TURMERIC CURCUMIN) CAPS daily at 6 (six) AM.     Multiple Vitamin (MULTIVITAMIN) tablet Take 1 tablet by mouth daily.     nicotine polacrilex (NICORETTE) 4 MG gum Take 4 mg by mouth as needed for smoking cessation.     nitroGLYCERIN (NITROSTAT) 0.4 MG SL tablet every 5 (five) minutes as needed for chest pain.     rosuvastatin (CRESTOR) 20 MG tablet Take 20 mg by mouth daily.      traZODone (DESYREL) 50 MG tablet Take 0.5-1 tablets (25-50 mg total) by mouth at bedtime as needed. 90 tablet 3   triamcinolone (KENALOG) 0.1 % paste as needed.     Turmeric Curcumin 500 MG CAPS Take 1 capsule by mouth every other day.     vitamin C (ASCORBIC ACID) 500 MG tablet Take 500 mg by mouth daily.     vitamin E 100 UNIT capsule Take 100 Units by mouth daily.      No current facility-administered medications for this encounter.    REVIEW OF SYSTEMS: A 10+ POINT REVIEW OF SYSTEMS WAS OBTAINED including neurology, dermatology, psychiatry, cardiac, respiratory, lymph, extremities, GI, GU, musculoskeletal, constitutional, reproductive, HEENT. On the provided form, she reports wearing glasses, hearing loss, using hearing aids, sinus problems, dentures, history of COPD, a history of squamous cell carcinoma (04/11/23), and bruising easily. She denies any other symptoms.    PHYSICAL EXAM:     05/09/2023  Vitals with BMI   Height   Weight 131 lbs   BMI   Systolic 118   Diastolic 62   Pulse 74    Lungs are clear to auscultation bilaterally. Heart has regular rate and rhythm. No palpable cervical, supraclavicular, or axillary adenopathy. Abdomen soft, non-tender, normal bowel sounds. Breast: Right breast with no palpable mass, nipple discharge, or bleeding. Left breast with biopsy lesion near the nipple with no other abnormalities appreciated.   KPS = 100  100 -  Normal; no complaints; no evidence of disease. 90   - Able to carry on normal activity; minor signs or symptoms of disease. 80   - Normal activity with effort; some signs or symptoms of disease. 70   - Cares for self; unable to carry on normal activity or to do active work. 60   - Requires occasional assistance, but is able to care for most of his personal needs. 50   - Requires considerable assistance and frequent medical care. 40   - Disabled; requires special care and assistance. 30   - Severely disabled; hospital admission is indicated although death not imminent. 20   - Very sick; hospital admission necessary; active supportive treatment necessary. 10   - Moribund; fatal processes progressing rapidly. 0     - Dead  Karnofsky DA, Abelmann WH, Craver LS and Burchenal [facility] [phone]) The use of the nitrogen mustards in the palliative treatment of carcinoma: with particular reference to bronchogenic carcinoma Cancer 1 634-56  LABORATORY DATA:  Lab Results  Component Value Date   WBC 9.2 05/09/2023   HGB 15.5 (H) 05/09/2023   HCT 46.0 05/09/2023   MCV 87.3 05/09/2023   PLT 196 05/09/2023   Lab Results  Component Value Date   NA 141 05/09/2023   K 4.0 05/09/2023   CL 104 05/09/2023   CO2 30 05/09/2023   Lab Results  Component Value Date   ALT 13 05/09/2023   AST 15 05/09/2023   ALKPHOS 70 05/09/2023   BILITOT 0.6 05/09/2023    PULMONARY FUNCTION TEST:   Review Flowsheet        No data to display          RADIOGRAPHY: No results found.    IMPRESSION: Stage IA (cT1a, cN0, cM0) Left Breast UOQ, Invasive Ductal Carcinoma, ER+ / PR+ / Her2-, Grade 1  We discussed this patient's case at our tumor board and the consensus was to proceed with a lumpectomy and adjuvant antiestrogen therapy with optional radiation. We discussed the risks, benefits, short, and long term effects of radiotherapy, as well as the curative intent, and the patient expressed understanding. Considering her  favorable, early-stage disease, radiation therapy offers minimal benefit for locoregional disease control. However, the patient remains interested in proceeding with treatment, recognizing that there is still some benefit. We we will see the patient back approximately 3 to 4 weeks after her lumpectomy to review final pathology and discuss radiation further.  We look forward to participating in this patient's care.  PLAN:  Genetics  Left breast lumpectomy  Radiation optional pending further discussion Aromatase inhibitor    ------------------------------------------------   Bryan Lemma, PA-C   Billie Lade, PhD, MD   [facility]  Radiation Oncology Direct Dial: [phone]  Fax: [phone] [email or web address]    This document serves as a record of services personally performed by Antony Blackbird, MD and Bryan Lemma, PA-C. It was created on his behalf by Neena Rhymes, a trained medical scribe. The creation of this record is based on the scribe's personal observations and the provider's statements to them. This document has been checked and approved by the attending provider.

## 2023-05-09 NOTE — Progress Notes (Signed)
REFERRING PROVIDER: Rachel Moulds, MD [address] [place],  [place] [address]  PRIMARY PROVIDER:  Daisy Floro, MD  PRIMARY REASON FOR VISIT:  1. Malignant neoplasm of upper-outer quadrant of left breast in female, estrogen receptor positive (HCC)   2. Family history of pancreatic cancer      HISTORY OF PRESENT ILLNESS:   [name], a 77 y.o. female, was seen for a [place] cancer genetics consultation at the request of Dr. Al Pimple due to a personal history of breast cancer.  Casey Kirk presents to clinic today to discuss the possibility of a hereditary predisposition to cancer, to discuss genetic testing, and to further clarify her future cancer risks, as well as potential cancer risks for family members.   In December 2024, at the age of 71, Casey Kirk was diagnosed with invasive ductal carcinoma of the left breast (ER+/PR+/HER2-. The treatment plan is pending.  She also reported a history of ~8 lifetime colon polyps.    CANCER HISTORY:  Oncology History  Malignant neoplasm of upper-outer quadrant of left breast in female, estrogen receptor positive (HCC)  03/28/2023 Mammogram   Screening mammogram showed focal asymmetry in the left breast, indeterminate, additional views with possible ultrasound are recommended.  Diagnostic mammogram confirmed a 7 mm focal asymmetry in the left breast.   04/25/2023 Pathology Results   Left breast needle core biopsy, mass at 3:00 5 cm from the nipple showed overall grade 1 invasive ductal carcinoma, prognostic showed estrogen 95% positive strong staining, progesterone 100% positive strong staining, Ki-67 of less than 1% and HER2 0   05/07/2023 Initial Diagnosis   Malignant neoplasm of upper-outer quadrant of left breast in female, estrogen receptor positive (HCC)   05/09/2023 Cancer Staging   Staging form: Breast, AJCC 8th Edition - Clinical stage from 05/09/2023: Stage IA (cT1a, cN0, cM0, G1, ER+, PR+, HER2-) - Signed by Rachel Moulds, MD  on 05/09/2023 Stage prefix: Initial diagnosis Histologic grading system: 3 grade system       Past Medical History:  Diagnosis Date   Allergic rhinitis    Aortic atherosclerosis (HCC) 08/17/2016   Arthritis    ARTHRITIS, HX OF 01/09/2007   BIPOLAR II DISORDER 03/17/2008   Breast cancer (HCC)    Carcinoma of vaginal vault (HCC)    COPD 12/21/2006   COPD (chronic obstructive pulmonary disease) (HCC) 12/21/2006   Emphysema on CT 06/2016 02/2016 FEV1 of 98%     Coronary artery calcification 08/17/2016   Coronary artery disease involving native coronary artery of native heart with unstable angina pectoris (HCC)    Dysphasia 05/10/2011   Family history of early CAD 08/17/2016   GERD (gastroesophageal reflux disease)    GOUT 01/09/2007   Hx of colonic polyps    Hypertension    Hypothyroidism 10/17/2010   IBS (irritable bowel syndrome)    Osteoporosis    Sinusitis    Thyroid disease    TINEA CORPORIS 10/06/2009   Tobacco abuse 05/10/2011    Past Surgical History:  Procedure Laterality Date   ABDOMINAL HYSTERECTOMY     CA cells removed end of vagina   BREAST SURGERY     biopsy   COLONOSCOPY     CORONARY PRESSURE/FFR STUDY N/A 03/16/2017   Procedure: INTRAVASCULAR PRESSURE WIRE/FFR STUDY;  Surgeon: Kathleene Hazel, MD;  [facility];  Service: Cardiovascular;  Laterality: N/A;   CORONARY STENT INTERVENTION N/A 03/16/2017   Procedure: CORONARY STENT INTERVENTION;  Surgeon: Kathleene Hazel, MD;  [facility];  Service: Cardiovascular;  Laterality: N/A;   EYE SURGERY Right    cataract   LEFT HEART CATH AND CORONARY ANGIOGRAPHY N/A 03/16/2017   Procedure: LEFT HEART CATH AND CORONARY ANGIOGRAPHY;  Surgeon: Kathleene Hazel, MD;  [facility]: [facility];  Service: Cardiovascular;  Laterality: N/A;   LEFT HEART CATH AND CORONARY ANGIOGRAPHY N/A 01/30/2018   Procedure: LEFT HEART CATH AND CORONARY ANGIOGRAPHY;  Surgeon:  Kathleene Hazel, MD;  [facility]: [facility];  Service: Cardiovascular;  Laterality: N/A;   MASS EXCISION Right 05/10/2018   Procedure: EXCISION MASS OF RIGHT PALATAL  WITH LOCAL SOFT TISSUE RECONSTRUCTION;  Surgeon: Osborn Coho, MD;  Location: [facility] OR;  Service: ENT;  Laterality: Right;   TOOTH EXTRACTION     vaginal carcinoma surgery      FAMILY HISTORY:  We obtained a detailed, 4-generation family history.  Significant diagnoses are listed below: Family History  Problem Relation Age of Onset   Colon polyps Mother        unknown number   Colon polyps Brother        unknown number   Pancreatic cancer Cousin        maternal female cousin    Ms. Berntson is unaware of previous family history of genetic testing for hereditary cancer risks. There is no reported Ashkenazi Jewish ancestry. There is no known consanguinity.  GENETIC COUNSELING ASSESSMENT: Casey Kirk is a 77 y.o. female with a personal and family history which is somewhat suggestive of a hereditary cancer syndrome and predisposition to cancer given the presence of both breast and pancreatic cancer in the family. We, therefore, discussed and recommended the following at today's visit.   DISCUSSION: We discussed that 5 - 10% of cancer is hereditary.  Most cases of hereditary breast and pancreatic cancers are associated with mutations in BRCA1/2.  There are other genes that can be associated with hereditary breast cancer, pancreatic cancer, or polyposis syndromes.  We discussed that testing is beneficial for several reasons including knowing how to follow individuals for their cancer risks and understanding if other family members could be at risk for cancer and allowing them to undergo genetic testing.   We reviewed the characteristics, features and inheritance patterns of hereditary cancer syndromes. We also discussed genetic testing, including the appropriate family members to test, the process of testing, insurance  coverage and turn-around-time for results. We discussed the implications of a negative, positive, carrier and/or variant of uncertain significant result. We recommended Ms. Harkless pursue genetic testing for a panel that includes genes associated with breast cancer, pancreatic cancer, other cancers, and colon polyps.   Based on Ms. Engel's personal history of breast cancer with a family history of pancreatic cancer in her maternal cousin, she meets NCCN criteria for genetic testing. Despite that she meets criteria, she may still have an out of pocket cost. If her out of pocket cost for testing is over $100, the laboratory should contact her and discuss the self-pay prices and/or patient pay assistance programs.    PLAN:  Ms. Silberman did not wish to pursue genetic testing at today's visit.  She said she was feeling overwhelmed by the information provided during multidisciplinary clinic today, and she wants to take time to think about testing before deciding.  We understand this decision and remain available to coordinate genetic testing at any time in the future. We, therefore, recommend Ms. Henshaw continue to follow the cancer screening guidelines given by  her primary healthcare provider.  Relatives should inform their providers of the family history of cancer to ensure appropriate screening.   Based on Ms. Hemberger's family history, we recommend relatives more closely related to her cousin who passed away from pancreatic cancer have genetic counseling and testing. Ms. Syrett can let us know if we can be of any assistance in coordinating genetic counseling and/or testing for this family member.   Ms. Finnigan questions were answered to her satisfaction today. Our contact information was provided should additional questions or concerns arise. Thank you for the referral and allowing [place] to share in the care of your patient.   Tylyn Derwin M. Rennie Plowman, MS, [facility] Genetic Counselor [email or web address] (P)  [phone]  The patient was seen for ~12 minutes of face-to-face genetic counseling.  The patient was accompanied by her husband.  Dr. Al Pimple was available to discuss this case as needed.    _______________________________________________________________________ For Office Staff:  Number of people involved in session: 2 Was an Intern/ student involved with case: no

## 2023-05-10 ENCOUNTER — Telehealth: Payer: Self-pay

## 2023-05-10 NOTE — Telephone Encounter (Signed)
  Patient Consent for Virtual Visit         Casey Kirk has provided verbal consent on 05/10/2023 for a virtual visit (video or telephone).   CONSENT FOR VIRTUAL VISIT FOR:  Casey Kirk  By participating in this virtual visit I agree to the following:  I hereby voluntarily request, consent and authorize [facility] and its employed or contracted physicians, physician assistants, nurse practitioners or other licensed health care professionals (the Practitioner), to provide me with telemedicine health care services (the "Services") as deemed necessary by the treating Practitioner. I acknowledge and consent to receive the Services by the Practitioner via telemedicine. I understand that the telemedicine visit will involve communicating with the Practitioner through live audiovisual communication technology and the disclosure of certain medical information by electronic transmission. I acknowledge that I have been given the opportunity to request an in-person assessment or other available alternative prior to the telemedicine visit and am voluntarily participating in the telemedicine visit.  I understand that I have the right to withhold or withdraw my consent to the use of telemedicine in the course of my care at any time, without affecting my right to future care or treatment, and that the Practitioner or I may terminate the telemedicine visit at any time. I understand that I have the right to inspect all information obtained and/or recorded in the course of the telemedicine visit and may receive copies of available information for a reasonable fee.  I understand that some of the potential risks of receiving the Services via telemedicine include:  Delay or interruption in medical evaluation due to technological equipment failure or disruption; Information transmitted may not be sufficient (e.g. poor resolution of images) to allow for appropriate medical decision making by the  Practitioner; and/or  In rare instances, security protocols could fail, causing a breach of personal health information.  Furthermore, I acknowledge that it is my responsibility to provide information about my medical history, conditions and care that is complete and accurate to the best of my ability. I acknowledge that Practitioner's advice, recommendations, and/or decision may be based on factors not within their control, such as incomplete or inaccurate data provided by me or distortions of diagnostic images or specimens that may result from electronic transmissions. I understand that the practice of medicine is not an exact science and that Practitioner makes no warranties or guarantees regarding treatment outcomes. I acknowledge that a copy of this consent can be made available to me via my patient portal [facility] MyChart), or I can request a printed copy by calling the office of [facility].    I understand that my insurance will be billed for this visit.   I have read or had this consent read to me. I understand the contents of this consent, which adequately explains the benefits and risks of the Services being provided via telemedicine.  I have been provided ample opportunity to ask questions regarding this consent and the Services and have had my questions answered to my satisfaction. I give my informed consent for the services to be provided through the use of telemedicine in my medical care

## 2023-05-10 NOTE — Telephone Encounter (Signed)
Patient scheduled for tele visit on 05/24/23. Med rec and consent done

## 2023-05-11 ENCOUNTER — Encounter: Payer: Self-pay | Admitting: *Deleted

## 2023-05-11 ENCOUNTER — Telehealth: Payer: Self-pay | Admitting: *Deleted

## 2023-05-11 NOTE — Telephone Encounter (Signed)
Left vm regarding [facility] from 05/09/23. Contact information provided for questions or needs.

## 2023-05-17 ENCOUNTER — Other Ambulatory Visit: Payer: PPO

## 2023-05-18 ENCOUNTER — Telehealth: Payer: Self-pay | Admitting: *Deleted

## 2023-05-18 NOTE — Telephone Encounter (Signed)
[employer] 2021-05 - Specimen Collection Study to Evaluate Biomarkers in Subjects with Cancer   Called patient to follow up on the above study and see if she has any questions. Patient states she has not had a chance to read the consent form and requests research nurse call back next week. Patient agreed to phone call next Tuesday 12/31 in the afternoon. Thanked patient for her time. Domenica Reamer, BSN, RN, [employer] Clinical Research Nurse II [phone] 05/18/2023 2:07 PM

## 2023-05-22 ENCOUNTER — Telehealth: Payer: Self-pay | Admitting: *Deleted

## 2023-05-22 NOTE — Telephone Encounter (Signed)
 [employer] 2021-05 - Specimen Collection Study to Evaluate Biomarkers in Subjects with Cancer   LVM for patient to follow up on the above study. Asked patient to return call at her convenience.  Cherylyn Hoard, BSN, RN, [employer] Research Nurse II [phone] 05/22/2023 4:22 PM

## 2023-05-24 ENCOUNTER — Ambulatory Visit: Payer: PPO | Attending: Nurse Practitioner | Admitting: Nurse Practitioner

## 2023-05-24 ENCOUNTER — Encounter: Payer: Self-pay | Admitting: Nurse Practitioner

## 2023-05-24 DIAGNOSIS — Z01810 Encounter for preprocedural cardiovascular examination: Secondary | ICD-10-CM | POA: Diagnosis not present

## 2023-05-24 NOTE — Progress Notes (Signed)
 Virtual Visit via Telephone Note   Because of Casey Kirk's co-morbid illnesses, she is at least at moderate risk for complications without adequate follow up.  This format is felt to be most appropriate for this patient at this time.  The patient did not have access to video technology/had technical difficulties with video requiring transitioning to audio format only (telephone).  All issues noted in this document were discussed and addressed.  No physical exam could be performed with this format.  Please refer to the patient's chart for her consent to telehealth for [facility].  Evaluation Performed:  Preoperative cardiovascular risk assessment _____________   Date:  05/24/2023   Patient ID:  Casey Kirk, DOB 06-28-1945, MRN [id number] Patient Location:  Home Provider location:   Office  Primary Care Provider:  Okey Carlin Redbird, MD Primary Cardiologist:  Oneil Parchment, MD  Chief Complaint / Patient Profile   78 y.o. y/o female with a h/o CAD with 80% stenosis mid LAD s/p PCI/DES, repeat cath 2019 with patent LAD stent, mild mRCA stenosis 20% COPD, aortic atherosclerosis, PACs, tobacco abuse, hyperlipidemia, GERD who is pending lumpectomy with Dr. Ebbie, date TBD and presents today for telephonic preoperative cardiovascular risk assessment.  History of Present Illness    Casey Kirk is a 78 y.o. female who presents via audio/video conferencing for a telehealth visit today.  Pt was last seen in cardiology clinic on 06/16/2022 by Dr. Parchment.  At that time Casey Kirk was doing well.  The patient is now pending procedure as outlined above. Since her last visit, she denies chest pain, shortness of breath, lower extremity edema, fatigue, palpitations, melena, hematuria, hemoptysis, diaphoresis, weakness, presyncope, syncope, orthopnea, and PND. She remains active with walking approximately 1 mile daily, house and yard work and she does not have any concerning cardiac  symptoms.   Past Medical History    Past Medical History:  Diagnosis Date   Allergic rhinitis    Aortic atherosclerosis (HCC) 08/17/2016   Arthritis    ARTHRITIS, HX OF 01/09/2007   BIPOLAR II DISORDER 03/17/2008   Breast cancer (HCC)    Carcinoma of vaginal vault (HCC)    COPD 12/21/2006   COPD (chronic obstructive pulmonary disease) (HCC) 12/21/2006   Emphysema on CT 06/2016 02/2016 FEV1 of 98%     Coronary artery calcification 08/17/2016   Coronary artery disease involving native coronary artery of native heart with unstable angina pectoris (HCC)    Dysphasia 05/10/2011   Family history of early CAD 08/17/2016   GERD (gastroesophageal reflux disease)    GOUT 01/09/2007   Hx of colonic polyps    Hypertension    Hypothyroidism 10/17/2010   IBS (irritable bowel syndrome)    Osteoporosis    Sinusitis    Thyroid  disease    TINEA CORPORIS 10/06/2009   Tobacco abuse 05/10/2011   Past Surgical History:  Procedure Laterality Date   ABDOMINAL HYSTERECTOMY     CA cells removed end of vagina   BREAST SURGERY     biopsy   COLONOSCOPY     CORONARY PRESSURE/FFR STUDY N/A 03/16/2017   Procedure: INTRAVASCULAR PRESSURE WIRE/FFR STUDY;  Surgeon: Verlin Lonni BIRCH, MD;  Location: [facility] INVASIVE CV LAB;  Service: Cardiovascular;  Laterality: N/A;   CORONARY STENT INTERVENTION N/A 03/16/2017   Procedure: CORONARY STENT INTERVENTION;  Surgeon: Verlin Lonni BIRCH, MD;  Location: [facility] INVASIVE CV LAB;  Service: Cardiovascular;  Laterality: N/A;   EYE SURGERY Right    cataract  LEFT HEART CATH AND CORONARY ANGIOGRAPHY N/A 03/16/2017   Procedure: LEFT HEART CATH AND CORONARY ANGIOGRAPHY;  Surgeon: Verlin Lonni BIRCH, MD;  Location: [facility] INVASIVE CV LAB;  Service: Cardiovascular;  Laterality: N/A;   LEFT HEART CATH AND CORONARY ANGIOGRAPHY N/A 01/30/2018   Procedure: LEFT HEART CATH AND CORONARY ANGIOGRAPHY;  Surgeon: Verlin Lonni BIRCH, MD;  Location: [facility] INVASIVE CV LAB;   Service: Cardiovascular;  Laterality: N/A;   MASS EXCISION Right 05/10/2018   Procedure: EXCISION MASS OF RIGHT PALATAL  WITH LOCAL SOFT TISSUE RECONSTRUCTION;  Surgeon: Mable Lenis, MD;  Location: [facility] OR;  Service: ENT;  Laterality: Right;   TOOTH EXTRACTION     vaginal carcinoma surgery      Allergies  Allergies  Allergen Reactions   Hydrocodone-Acetaminophen  Itching and Swelling   Latex Itching and Swelling   Pneumococcal Polysaccharide Vaccine Swelling    Fever   Hydrocodone Swelling    Other Reaction(s): Not available    Home Medications    Prior to Admission medications   Medication Sig Start Date End Date Taking? Authorizing Provider  acetaminophen  (TYLENOL ) 500 MG tablet Take 500 mg by mouth every 8 (eight) hours as needed (pain).    [provider]  albuterol  (PROVENTIL  HFA;VENTOLIN  HFA) 108 (90 Base) MCG/ACT inhaler Inhale 2 puffs into the lungs every 6 (six) hours as needed for wheezing or shortness of breath. 02/01/17   Parrett, Madelin RAMAN, NP  aspirin  EC 81 MG tablet Take 81 mg by mouth daily.    [provider]  CALCIUM/MAGNESIUM/ZINC FORMULA PO Take 1 tablet by mouth 3 (three) times a week.    [provider]  cholecalciferol (VITAMIN D) 1000 units tablet Take 1,000 Units by mouth daily.    [provider]  conjugated estrogens (PREMARIN) vaginal cream as needed (dryness). 08/12/20   [provider]  diphenhydrAMINE (BENADRYL) 25 mg capsule Take 25 mg by mouth daily as needed for allergies.    [provider]  diphenoxylate-atropine (LOMOTIL) 2.5-0.025 MG tablet TAKE 2 TABLETS BY MOUTH AS NEEDED 4 TIMES A DAY 09/11/18   [provider]  fish oil-omega-3 fatty acids 1000 MG capsule Take 1 g by mouth 2 (two) times a week. qod    [provider]  fluticasone  (FLONASE ) 50 MCG/ACT nasal spray Place 2 sprays into both nostrils daily as needed for allergies or rhinitis.    [provider]   guaiFENesin (MUCINEX) 600 MG 12 hr tablet as needed for cough or to loosen phlegm.    [provider]  Melatonin 3 MG TABS Take 3 mg by mouth at bedtime.    [provider]  metoprolol succinate (TOPROL-XL) 25 MG 24 hr tablet Take 25 mg by mouth daily. 07/11/21   [provider]  Misc Natural Products (TURMERIC CURCUMIN) CAPS daily at 6 (six) AM.    [provider]  Multiple Vitamin (MULTIVITAMIN) tablet Take 1 tablet by mouth daily.    [provider]  nicotine  polacrilex (NICORETTE) 4 MG gum Take 4 mg by mouth as needed for smoking cessation.    [provider]  nitroGLYCERIN  (NITROSTAT ) 0.4 MG SL tablet every 5 (five) minutes as needed for chest pain.    [provider]  rosuvastatin (CRESTOR) 20 MG tablet Take 20 mg by mouth daily.  03/28/17   [provider]  traZODone  (DESYREL ) 50 MG tablet Take 0.5-1 tablets (25-50 mg total) by mouth at bedtime as needed. 11/13/22   Rhys Verneita DASEN, PA-C  triamcinolone (KENALOG) 0.1 % paste as needed.    [provider]  Turmeric Curcumin 500 MG CAPS Take 1 capsule by mouth every other day.    [provider]  vitamin C (ASCORBIC ACID) 500 MG tablet Take 500 mg by mouth daily.    [provider]  vitamin E 100 UNIT capsule Take 100 Units by mouth daily.     [provider]    Physical Exam    Vital Signs:  ANALISSE RANDLE does not have vital signs available for review today.  Given telephonic nature of communication, physical exam is limited. AAOx3. NAD. Normal affect.  Speech and respirations are unlabored.  Accessory Clinical Findings    None  Assessment & Plan    1.  Preoperative Cardiovascular Risk Assessment: According to the Revised Cardiac Risk Index (RCRI), her Perioperative Risk of Major Cardiac Event is (%): 0.9. Her Functional Capacity in METs is: 8.23 according to the Duke Activity Status Index (DASI). The patient is doing well from  a cardiac perspective. Therefore, based on ACC/AHA guidelines, the patient would be at acceptable risk for the planned procedure without further cardiovascular testing.   The patient was advised that if she develops new symptoms prior to surgery to contact our office to arrange for a follow-up visit, and she verbalized understanding.  Per office protocol, she may hold aspirin  for 5-7 days prior to procedure. Please resume Aspirin  as soon as possible postprocedure, at the discretion of the surgeon.   A copy of this note will be routed to requesting surgeon.  Time:   Today, I have spent 10 minutes with the patient with telehealth technology discussing medical history, symptoms, and management plan.    Rosaline EMERSON Bane, NP-C  05/24/2023, 9:43 AM [address] Office [phone] Fax [phone]

## 2023-05-25 ENCOUNTER — Encounter: Payer: Self-pay | Admitting: *Deleted

## 2023-05-25 ENCOUNTER — Other Ambulatory Visit: Payer: Self-pay | Admitting: General Surgery

## 2023-05-25 ENCOUNTER — Telehealth: Payer: Self-pay | Admitting: Hematology and Oncology

## 2023-05-25 DIAGNOSIS — C50412 Malignant neoplasm of upper-outer quadrant of left female breast: Secondary | ICD-10-CM

## 2023-05-25 NOTE — Telephone Encounter (Signed)
 Marland Kitchen

## 2023-05-28 ENCOUNTER — Telehealth: Payer: Self-pay | Admitting: *Deleted

## 2023-05-28 NOTE — Telephone Encounter (Signed)
 [employer] 2021-05 - Specimen Collection Study to Evaluate Biomarkers in Subjects with Cancer   Called patient to follow up on the above study.  She states she will have to decline as it is too much for her to do at this time.  Thanked patient for her time and consideration of this study.  Cherylyn Hoard, BSN, RN, [employer] Clinical Research Nurse II [phone] 05/28/2023 3:25 PM

## 2023-05-29 ENCOUNTER — Institutional Professional Consult (permissible substitution) (INDEPENDENT_AMBULATORY_CARE_PROVIDER_SITE_OTHER): Payer: PPO

## 2023-05-30 DIAGNOSIS — C50912 Malignant neoplasm of unspecified site of left female breast: Secondary | ICD-10-CM | POA: Diagnosis not present

## 2023-06-04 NOTE — Pre-Procedure Instructions (Signed)
 Surgical Instructions   Your procedure is scheduled on Friday 06/08/23.  Report to [facility] Main Entrance A at 05:30 A.M., then check in with the Admitting office. Any questions or running late day of surgery: call [phone]  Questions prior to your surgery date: call [phone], Monday-Friday, 8am-4pm. If you experience any cold or flu symptoms such as cough, fever, chills, shortness of breath, etc. between now and your scheduled surgery, please notify us  at the above number.     Remember:  Do not eat after midnight the night before your surgery   You may drink clear liquids until 04:30 A.M. the morning of your surgery.    Clear liquids allowed are: Water, Non-Citrus Juices (without pulp), Carbonated Beverages, Clear Tea (no milk, honey, etc.), Black Coffee Only (NO MILK, CREAM OR POWDERED CREAMER of any kind), and Gatorade.  Patient Instructions  The night before surgery:  No food after midnight. ONLY clear liquids after midnight  The day of surgery (if you do NOT have diabetes):  Drink ONE (1) Pre-Surgery Clear Ensure by 04:30 A.M. the morning of surgery. Drink in one sitting. Do not sip.  This drink was given to you during your hospital  pre-op appointment visit.  Nothing else to drink after completing the  Pre-Surgery Clear Ensure.         If you have questions, please contact your surgeon's office.     Take these medicines the morning of surgery with A SIP OF WATER   metoprolol succinate (TOPROL-XL)   rosuvastatin (CRESTOR)    May take these medicines IF NEEDED: acetaminophen  (TYLENOL )  fluticasone  (FLONASE )  guaiFENesin (MUCINEX)  nitroGLYCERIN  (NITROSTAT )  tiZANidine (ZANAFLEX)  loperamide (IMODIUM A-D)    One week prior to surgery, STOP taking any Aspirin  (unless otherwise instructed by your surgeon) Aleve, Naproxen, Ibuprofen, Motrin, Advil, Goody's, BC's, all herbal medications, fish oil, and non-prescription vitamins.                     Do NOT  Smoke (Tobacco/Vaping) for 24 hours prior to your procedure.  If you use a CPAP at night, you may bring your mask/headgear for your overnight stay.   You will be asked to remove any contacts, glasses, piercing's, hearing aid's, dentures/partials prior to surgery. Please bring cases for these items if needed.    Patients discharged the day of surgery will not be allowed to drive home, and someone needs to stay with them for 24 hours.  SURGICAL WAITING ROOM VISITATION Patients may have no more than 2 support people in the waiting area - these visitors may rotate.   Pre-op nurse will coordinate an appropriate time for 1 ADULT support person, who may not rotate, to accompany patient in pre-op.  Children under the age of 27 must have an adult with them who is not the patient and must remain in the main waiting area with an adult.  If the patient needs to stay at the hospital during part of their recovery, the visitor guidelines for inpatient rooms apply.  Please refer to the [facility] website for the visitor guidelines for any additional information.   If you received a COVID test during your pre-op visit  it is requested that you wear a mask when out in public, stay away from anyone that may not be feeling well and notify your surgeon if you develop symptoms. If you have been in contact with anyone that has tested positive in the last 10 days please notify you  careers adviser.      Pre-operative CHG Bathing Instructions   You can play a key role in reducing the risk of infection after surgery. Your skin needs to be as free of germs as possible. You can reduce the number of germs on your skin by washing with CHG (chlorhexidine  gluconate) soap before surgery. CHG is an antiseptic soap that kills germs and continues to kill germs even after washing.   DO NOT use if you have an allergy to chlorhexidine /CHG or antibacterial soaps. If your skin becomes reddened or irritated, stop using the CHG and notify  one of our RNs at [phone].              TAKE A SHOWER THE NIGHT BEFORE SURGERY AND THE DAY OF SURGERY    Please keep in mind the following:  DO NOT shave, including legs and underarms, 48 hours prior to surgery.   You may shave your face before/day of surgery.  Place clean sheets on your bed the night before surgery Use a clean washcloth (not used since being washed) for each shower. DO NOT sleep with pet's night before surgery.  CHG Shower Instructions:  Wash your face and private area with normal soap. If you choose to wash your hair, wash first with your normal shampoo.  After you use shampoo/soap, rinse your hair and body thoroughly to remove shampoo/soap residue.  Turn the water OFF and apply half the bottle of CHG soap to a CLEAN washcloth.  Apply CHG soap ONLY FROM YOUR NECK DOWN TO YOUR TOES (washing for 3-5 minutes)  DO NOT use CHG soap on face, private areas, open wounds, or sores.  Pay special attention to the area where your surgery is being performed.  If you are having back surgery, having someone wash your back for you may be helpful. Wait 2 minutes after CHG soap is applied, then you may rinse off the CHG soap.  Pat dry with a clean towel  Put on clean pajamas    Additional instructions for the day of surgery: DO NOT APPLY any lotions, deodorants, cologne, or perfumes.   Do not wear jewelry or makeup Do not wear nail polish, gel polish, artificial nails, or any other type of covering on natural nails (fingers and toes) Do not bring valuables to the hospital. [facility] is not responsible for valuables/personal belongings. Put on clean/comfortable clothes.  Please brush your teeth.  Ask your nurse before applying any prescription medications to the skin.

## 2023-06-05 ENCOUNTER — Encounter (HOSPITAL_COMMUNITY): Payer: Self-pay

## 2023-06-05 ENCOUNTER — Other Ambulatory Visit: Payer: Self-pay

## 2023-06-05 ENCOUNTER — Encounter (HOSPITAL_COMMUNITY)
Admission: RE | Admit: 2023-06-05 | Discharge: 2023-06-05 | Disposition: A | Discharge status: Home / Self Care | Payer: PPO | Source: Ambulatory Visit | Attending: General Surgery | Admitting: General Surgery

## 2023-06-05 DIAGNOSIS — J449 Chronic obstructive pulmonary disease, unspecified: Secondary | ICD-10-CM | POA: Diagnosis not present

## 2023-06-05 DIAGNOSIS — Z01818 Encounter for other preprocedural examination: Secondary | ICD-10-CM | POA: Insufficient documentation

## 2023-06-05 DIAGNOSIS — K589 Irritable bowel syndrome without diarrhea: Secondary | ICD-10-CM | POA: Insufficient documentation

## 2023-06-05 DIAGNOSIS — E785 Hyperlipidemia, unspecified: Secondary | ICD-10-CM | POA: Diagnosis not present

## 2023-06-05 DIAGNOSIS — I2510 Atherosclerotic heart disease of native coronary artery without angina pectoris: Secondary | ICD-10-CM | POA: Diagnosis not present

## 2023-06-05 DIAGNOSIS — C50912 Malignant neoplasm of unspecified site of left female breast: Secondary | ICD-10-CM | POA: Diagnosis not present

## 2023-06-05 NOTE — Progress Notes (Addendum)
 PCP - Okey Carlin Dawn, MD Cardiologist - Ginny Anes, MD Pulmonologist- Drusilla Sharper, MD   PPM/ICD - denies Device Orders - n/a Rep Notified - n/a  Chest x-ray - 10/20/2020 EKG - 06/10/2022  Stress Test - 06/27/2022 ECHO - denies Cardiac Cath - 19/03/2018  Sleep Study - denies CPAP - n/a  Fasting Blood Sugar - no DM Checks Blood Sugar _____ times a day  Last dose of GLP1 agonist-  n/a GLP1 instructions: n/a  Blood Thinner Instructions: n/a Aspirin  Instructions: per cardiology hold aspirin  5-7 days prior to procedure. Last dose 05/31/2023.   ERAS Protcol - yes, till 0430 AM PRE-SURGERY Ensure or G2- Ensure  COVID TEST- n/a   Anesthesia review: yes, cardiac clearance; seed placement on 06/07/2023.   Patient denies shortness of breath, fever, cough and chest pain at PAT appointment   All instructions explained to the patient, with a verbal understanding of the material. Patient agrees to go over the instructions while at home for a better understanding. Patient also instructed to self quarantine after being tested for COVID-19. The opportunity to ask questions was provided.

## 2023-06-06 NOTE — Progress Notes (Signed)
 Anesthesia Chart Review:  Case: [id number] Date/Time: 06/08/23 0715   Procedure: LEFT BREAST LUMPECTOMY WITH RADIOACTIVE SEED LOCALIZATION (Left)   Anesthesia type: General   Pre-op diagnosis: LEFT BREAST CANCER   [facility] / [facility]   Surgeons: Ebbie Cough, MD       DISCUSSION: Patient is a 78 year old female scheduled for the above procedure.  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   She had telephonic cardiology visit with Percy Klinefelter, NP on 05/24/23. S/p DES LAD in 02/2017. 3.8 second pause at 5:40 AM on 2021 event monitor while sleeping. Has had junctional rhythm on prior EKG. Non-ischemic stress test, EF 69% in 06/2022. Preoperative Cardiovascular Risk Assessment: According to the Revised Cardiac Risk Index (RCRI), her Perioperative Risk of Major Cardiac Event is (%): 0.9. Her Functional Capacity in METs is: 8.23 according to the Duke Activity Status Index (DASI). The patient is doing well from a cardiac perspective. Therefore, based on ACC/AHA guidelines, the patient would be at acceptable risk for the planned procedure without further cardiovascular testing...  Per office protocol, she may hold aspirin  for 5-7 days prior to procedure. Please resume Aspirin  as soon as possible postprocedure, at the discretion of the surgeon.   Reported last aspirin  05/31/2023.  RSL scheduled for 06/07/23. Anesthesia team to evaluate on the day of surgery.    VS: BP 136/76   Pulse 76   Temp 36.9 C   Resp 16   Ht 5' 2 (1.575 m)   Wt 58.9 kg   SpO2 98%   BMI 23.74 kg/m    PROVIDERS: Okey Carlin Redbird, MD is PCP Jeffrie Anes, MD is cardiologist Loretha Ash, MD is DOUGLASS Shannon Agent, MD is RAD-[facility] Lanis Chew, MD is vascular surgeon. Visit on 12/08/22 for induration, burning  sensation in extremities at night. ABIs normal. Symptoms not felt consistent with Raynaud's. No vascular or venous etiology identified. She was taking trazodone  before sleep which can causes vasodilator effects, so he questioned if this was contributing and suggested decreasing of discontinuing to see if any changes in her symptoms.     LABS: Most recent lab results in [facility] include: Lab Results  Component Value Date   WBC 9.2 05/09/2023   HGB 15.5 (H) 05/09/2023   HCT 46.0 05/09/2023   PLT 196 05/09/2023   GLUCOSE 86 05/09/2023   ALT 13 05/09/2023   AST 15 05/09/2023   NA 141 05/09/2023   K 4.0 05/09/2023   CL 104 05/09/2023   CREATININE 0.76 05/09/2023   BUN 11 05/09/2023   CO2 30 05/09/2023    IMAGES: CT Chest LCS 05/09/22: IMPRESSION: 1. Lung-RADS 2, benign appearance or behavior. Continue annual screening with low-dose chest CT without contrast in 12 months. 2. Two-vessel coronary atherosclerosis. 3. Aortic Atherosclerosis (ICD10-I70.0) and Emphysema (ICD10-J43.9).   EKG: EKG 06/16/22 ([facility]): Junctional rhythm at 57 bpm with premature supraventricular complexes.  Septal infarct, age undetermined.   CV: Nuclear stress test 06/27/22:   LV perfusion is normal. There is no evidence of ischemia. There is no evidence of infarction. Apical thinning artifact noted.   Left ventricular function is abnormal. Nuclear stress EF: 69 %. The left ventricular ejection fraction is hyperdynamic (>65%). End diastolic cavity size is normal.   The study is normal. The study is low risk.   Long term monitor 12/01/19 -  12/15/19: Sinus rhythm with average HR of 59 BPM 3.8 second pause at 5:40 AM (presumably sleeping, no symptoms reported) Frequent PAC's (7%) 73 episodes of SVT (ranging from 4 beats at 170 BPM to 19 seconds at 110 BPM). One diary episode surrounded an episode-fluttering. No atrial fibrillation   Cardiac cath 01/30/18: Previously placed Mid LAD drug eluting stent is  widely patent. Mid RCA lesion is 20% stenosed. The left ventricular systolic function is normal. LV end diastolic pressure is normal. The left ventricular ejection fraction is greater than 65% by visual estimate. There is no mitral valve regurgitation. Ost Cx to Prox Cx lesion is 20% stenosed.   1. Single vessel CAD with patent stent mid LAD without restenosis 2. Mild non-obstructive disease in the mid RCA and ostial Circumflex.  3. Normal LV systolic function.    Recommendations: Continue medical management of CAD. Tobacco cessation is advised.    US  Carotid 04/17/17: IMPRESSION: 1. Mild amount of calcified plaque at the level of the left carotid bulb and proximal ICA with estimated left ICA stenosis of less than 50%. 2. Minimal plaque at the level of the right carotid bulb. No evidence of right ICA stenosis.   Past Medical History:  Diagnosis Date   Allergic rhinitis    Aortic atherosclerosis (HCC) 08/17/2016   Arthritis    ARTHRITIS, HX OF 01/09/2007   BIPOLAR II DISORDER 03/17/2008   Breast cancer (HCC)    Carcinoma of vaginal vault (HCC)    COPD 12/21/2006   COPD (chronic obstructive pulmonary disease) (HCC) 12/21/2006   Emphysema on CT 06/2016 02/2016 FEV1 of 98%     Coronary artery calcification 08/17/2016   Coronary artery disease involving native coronary artery of native heart with unstable angina pectoris (HCC)    Dysphasia 05/10/2011   Family history of early CAD 08/17/2016   GERD (gastroesophageal reflux disease)    GOUT 01/09/2007   Hx of colonic polyps    Hypertension    Hypothyroidism 10/17/2010   IBS (irritable bowel syndrome)    Osteoporosis    Sinusitis    Thyroid  disease    TINEA CORPORIS 10/06/2009   Tobacco abuse 05/10/2011    Past Surgical History:  Procedure Laterality Date   ABDOMINAL HYSTERECTOMY     CA cells removed end of vagina   BREAST SURGERY     biopsy   COLONOSCOPY     CORONARY PRESSURE/FFR STUDY N/A 03/16/2017   Procedure:  INTRAVASCULAR PRESSURE WIRE/FFR STUDY;  Surgeon: Verlin Lonni BIRCH, MD;  [facility];  Service: Cardiovascular;  Laterality: N/A;   CORONARY STENT INTERVENTION N/A 03/16/2017   Procedure: CORONARY STENT INTERVENTION;  Surgeon: Verlin Lonni BIRCH, MD;  [facility];  Service: Cardiovascular;  Laterality: N/A;   EYE SURGERY Right    cataract   LEFT HEART CATH AND CORONARY ANGIOGRAPHY N/A 03/16/2017   Procedure: LEFT HEART CATH AND CORONARY ANGIOGRAPHY;  Surgeon: Verlin Lonni BIRCH, MD;  [facility];  Service: Cardiovascular;  Laterality: N/A;   LEFT HEART CATH AND CORONARY ANGIOGRAPHY N/A 01/30/2018   Procedure: LEFT HEART CATH AND CORONARY ANGIOGRAPHY;  Surgeon: Verlin Lonni BIRCH, MD;  [facility];  Service: Cardiovascular;  Laterality: N/A;   MASS EXCISION Right 05/10/2018   Procedure: EXCISION MASS OF RIGHT PALATAL  WITH LOCAL SOFT TISSUE RECONSTRUCTION;  Surgeon: Mable Lenis, MD;  Location: [facility];  Service: ENT;  Laterality: Right;   TOOTH EXTRACTION     vaginal carcinoma  surgery      MEDICATIONS:  acetaminophen  (TYLENOL ) 500 MG tablet   albuterol  (PROVENTIL  HFA;VENTOLIN  HFA) 108 (90 Base) MCG/ACT inhaler   Ascorbic Acid (VITAMIN C PO)   aspirin  EC 81 MG tablet   Calcium Citrate-Vitamin D (CALCIUM + D PO)   diphenhydrAMINE (BENADRYL) 25 mg capsule   fluticasone  (FLONASE ) 50 MCG/ACT nasal spray   GARLIC PO   guaiFENesin (MUCINEX) 600 MG 12 hr tablet   loperamide (IMODIUM A-D) 2 MG tablet   melatonin 5 MG TABS   metoprolol succinate (TOPROL-XL) 25 MG 24 hr tablet   nicotine  polacrilex (NICORETTE) 2 MG gum   nitroGLYCERIN  (NITROSTAT ) 0.4 MG SL tablet   Omega-3 Fatty Acids (FISH OIL PO)   rosuvastatin (CRESTOR) 20 MG tablet   tiZANidine (ZANAFLEX) 2 MG tablet   traZODone  (DESYREL ) 50 MG tablet   TURMERIC PO   VITAMIN A PO   VITAMIN E PO   No current facility-administered medications for  this encounter.    Isaiah Ruder, PA-C Surgical Short Stay/Anesthesiology [facility] Phone [phone] [facility] Phone [phone] 06/06/2023 3:55 PM

## 2023-06-06 NOTE — Anesthesia Preprocedure Evaluation (Addendum)
Anesthesia Evaluation  Patient identified by MRN, date of birth, ID band Patient awake    Reviewed: Allergy & Precautions, NPO status , Patient's Chart, lab work & pertinent test results  Airway Mallampati: II  TM Distance: >3 FB Neck ROM: Full    Dental  (+) Dental Advisory Given, Upper Dentures   Pulmonary COPD,  COPD inhaler, Current Smoker and Patient abstained from smoking.   Pulmonary exam normal breath sounds clear to auscultation       Cardiovascular hypertension, Pt. on home beta blockers + CAD and + Cardiac Stents  Normal cardiovascular exam Rhythm:Regular Rate:Normal     Neuro/Psych  PSYCHIATRIC DISORDERS   Bipolar Disorder   negative neurological ROS     GI/Hepatic Neg liver ROS,GERD  ,,  Endo/Other  Hypothyroidism    Renal/GU negative Renal ROS     Musculoskeletal  (+) Arthritis ,    Abdominal   Peds  Hematology negative hematology ROS (+)   Anesthesia Other Findings LEFT BREAST CANCER  Reproductive/Obstetrics                             Anesthesia Physical Anesthesia Plan  ASA: 3  Anesthesia Plan: General   Post-op Pain Management: Tylenol PO (pre-op)*   Induction: Intravenous  PONV Risk Score and Plan: 2 and Dexamethasone and Ondansetron  Airway Management Planned: LMA  Additional Equipment:   Intra-op Plan:   Post-operative Plan: Extubation in OR  Informed Consent: I have reviewed the patients History and Physical, chart, labs and discussed the procedure including the risks, benefits and alternatives for the proposed anesthesia with the patient or authorized representative who has indicated his/her understanding and acceptance.     Dental advisory given  Plan Discussed with: CRNA  Anesthesia Plan Comments: (PAT note written 06/06/2023 by Shonna Chock, PA-C.  )       Anesthesia Quick Evaluation

## 2023-06-07 NOTE — H&P (Signed)
77 yof with copd, cad who had screening mm that shows a focal left breast asymmetry. She has b density breast tissue. This is a 7 mm asymmetry by mm and a 5 mm mass on [place]. Her axilla is negative by [place]. Biopsy is a grade I IDC that is 95% er pos, 100% pr pos, her 2 negative and Ki is < 1%. She is here to discuss options.  Review of Systems: A complete review of systems was obtained from the patient. I have reviewed this information and discussed as appropriate with the patient. See HPI as well for other ROS.  Review of Systems  HENT: Positive for hearing loss.  Endo/Heme/Allergies: Bruises/bleeds easily.  All other systems reviewed and are negative.  Medical History: Past Medical History:  Diagnosis Date  Anxiety  COPD (chronic obstructive pulmonary disease) (CMS/HHS-HCC)  Heart valve disease  History of cancer  Thyroid disease   Past Surgical History:  Procedure Laterality Date  coronary stent  e/o mass  HYSTERECTOMY  left heart cath    Allergies  Allergen Reactions  Latex, Natural Rubber Itching and Swelling  rash  Hydrocodone Other (See Comments) and Swelling  Itching, rash   Current Outpatient Medications on File Prior to Visit  Medication Sig Dispense Refill  acetaminophen (TYLENOL) 500 MG tablet Take 500 mg by mouth  albuterol (PROVENTIL) 2.5 mg /3 mL (0.083 %) nebulizer solution Inhale 2.5 mg into the lungs every 6 (six) hours as needed  ascorbic acid, vitamin C, (VITAMIN C) 100 MG tablet Take 100 mg by mouth  ascorbic acid, vitamin C, (VITAMIN C) 500 MG tablet 1 tablet Orally once a day  aspirin 81 MG EC tablet Take 81 mg by mouth once daily  calcium-vitamin D 250 mg-2.5 mcg (100 unit) per tablet Take 1 tablet by mouth  cholecalciferol (VITAMIN D3) 1000 unit tablet Take 1,000 Units by mouth once daily  conjugated estrogens (PREMARIN) 0.625 mg/gram vaginal cream Premarin 0.625 mg/gram vaginal cream Insert 0.5 applicator(s)ful by vaginal route twice a week   diphenhydrAMINE (BENADRYL) 25 mg capsule Take 25 mg by mouth  diphenoxylate-atropine (LOMOTIL) 2.5-0.025 mg tablet Take 1 tablet by mouth 4 (four) times daily as needed  DOCOSAHEXAENOIC ACID ORAL Take 2 g by mouth once daily  fluticasone propion-salmeteroL (ADVAIR DISKUS) 250-50 mcg/dose diskus inhaler Inhale 1 Puff into the lungs every 12 (twelve) hours  guaiFENesin (MUCINEX) 600 mg SR tablet as needed for cough or to loosen phlegm.  melatonin 3 mg tablet Take 3 mg by mouth at bedtime  metoprolol SUCCinate (TOPROL-XL) 25 MG XL tablet 1 tablet Orally Once a day for 90 days  multivitamin tablet Take 1 tablet by mouth once daily  nicotine polacrilex (NICORETTE) 4 MG gum Take 4 mg by mouth  nitroGLYcerin (NITROSTAT) 0.4 MG SL tablet as directed1 tablet placed under the tongue at the first sign of chest pain. 1 tablet may be used every 5 minutes as needed, for up to 15 minutes. Do not take more than 3 tablets in 15 minutes. Sublingual as needed  rosuvastatin (CRESTOR) 20 MG tablet Take 20 mg by mouth once daily  traZODone (DESYREL) 50 MG tablet 1 tablet Orally once at bedtime  triamcinolone acetonide (ORALONE) 0.1 % paste as needed  turmeric-turmeric root extract 450-50 mg Cap Take 1 capsule by mouth  vitamin E, dl,tocopheryl acet, (VITAMIN E, DL, ACETATE,) 161 unit capsule Take 100 Units by mouth    Family History  Problem Relation Age of Onset  Myocardial Infarction (Heart attack) Brother  Social History   Tobacco Use  Smoking Status Every Day  Types: Cigarettes  Smokeless Tobacco Never  Marital status: Married  Tobacco Use  Smoking status: Every Day  Types: Cigarettes  Smokeless tobacco: Never   Objective:   Physical Exam Vitals reviewed.  Constitutional:  Appearance: Normal appearance.  Chest:  Breasts: Right: No inverted nipple, mass or nipple discharge.  Left: No inverted nipple, mass or nipple discharge.  Lymphadenopathy:  Upper Body:  Right upper body: No  supraclavicular or axillary adenopathy.  Left upper body: No supraclavicular or axillary adenopathy.  Neurological:  Mental Status: She is alert.    Assessment and Plan:   Left breast cancer, clinical stage I Left breast seed guided lumpectomy  We discussed the staging and pathophysiology of breast cancer. We discussed all of the different options for treatment for breast cancer including surgery, chemotherapy, radiation therapy, Herceptin, and antiestrogen therapy.  We discussed a sentinel lymph node biopsy. I think we can omit sn biopsy given size, prognostics of this tumor based on choosing wisely and SOUND trial data.  We discussed the options for treatment of the breast cancer which included lumpectomy versus a mastectomy. We discussed the performance of the lumpectomy with radioactive seed placement. We discussed a 5-10% chance of a positive margin requiring reexcision in the operating room. We also discussed that she might be recommended radiation therapy if she undergoes lumpectomy. We discussed mastectomy and the postoperative care for that as well. Mastectomy can be followed by reconstruction. The decision for lumpectomy vs mastectomy has no impact on decision for chemotherapy. Most mastectomy patients will not need radiation therapy. We discussed that there is no difference in her survival whether she undergoes lumpectomy with radiation therapy or antiestrogen therapy versus a mastectomy. There is also no real difference between her recurrence in the breast. I think lumpectomy alone best option.   We discussed the risks of operation including bleeding, infection, possible reoperation. She understands her further therapy will be based on what her stages at the time of her operation.

## 2023-06-08 ENCOUNTER — Ambulatory Visit (HOSPITAL_COMMUNITY): Payer: PPO | Admitting: Vascular Surgery

## 2023-06-08 ENCOUNTER — Other Ambulatory Visit: Payer: Self-pay

## 2023-06-08 ENCOUNTER — Encounter (HOSPITAL_COMMUNITY)
Admission: RE | Disposition: A | Discharge status: Home / Self Care | Payer: Self-pay | Source: Ambulatory Visit | Attending: General Surgery

## 2023-06-08 ENCOUNTER — Ambulatory Visit (HOSPITAL_BASED_OUTPATIENT_CLINIC_OR_DEPARTMENT_OTHER): Payer: PPO | Admitting: Anesthesiology

## 2023-06-08 ENCOUNTER — Encounter (HOSPITAL_COMMUNITY): Payer: Self-pay | Admitting: General Surgery

## 2023-06-08 ENCOUNTER — Ambulatory Visit (HOSPITAL_COMMUNITY)
Admission: RE | Admit: 2023-06-08 | Discharge: 2023-06-08 | Disposition: A | Discharge status: Home / Self Care | Payer: PPO | Source: Ambulatory Visit | Attending: General Surgery | Admitting: General Surgery

## 2023-06-08 DIAGNOSIS — J449 Chronic obstructive pulmonary disease, unspecified: Secondary | ICD-10-CM | POA: Insufficient documentation

## 2023-06-08 DIAGNOSIS — I2510 Atherosclerotic heart disease of native coronary artery without angina pectoris: Secondary | ICD-10-CM | POA: Insufficient documentation

## 2023-06-08 DIAGNOSIS — Z955 Presence of coronary angioplasty implant and graft: Secondary | ICD-10-CM | POA: Diagnosis not present

## 2023-06-08 DIAGNOSIS — F17210 Nicotine dependence, cigarettes, uncomplicated: Secondary | ICD-10-CM | POA: Diagnosis not present

## 2023-06-08 DIAGNOSIS — Z170 Estrogen receptor positive status [ER+]: Secondary | ICD-10-CM | POA: Diagnosis not present

## 2023-06-08 DIAGNOSIS — Z79899 Other long term (current) drug therapy: Secondary | ICD-10-CM | POA: Insufficient documentation

## 2023-06-08 DIAGNOSIS — C50912 Malignant neoplasm of unspecified site of left female breast: Secondary | ICD-10-CM | POA: Diagnosis present

## 2023-06-08 DIAGNOSIS — F319 Bipolar disorder, unspecified: Secondary | ICD-10-CM | POA: Diagnosis not present

## 2023-06-08 DIAGNOSIS — I10 Essential (primary) hypertension: Secondary | ICD-10-CM | POA: Diagnosis not present

## 2023-06-08 DIAGNOSIS — Z1721 Progesterone receptor positive status: Secondary | ICD-10-CM | POA: Diagnosis not present

## 2023-06-08 DIAGNOSIS — Z8249 Family history of ischemic heart disease and other diseases of the circulatory system: Secondary | ICD-10-CM | POA: Diagnosis not present

## 2023-06-08 DIAGNOSIS — I25110 Atherosclerotic heart disease of native coronary artery with unstable angina pectoris: Secondary | ICD-10-CM | POA: Diagnosis not present

## 2023-06-08 DIAGNOSIS — M1990 Unspecified osteoarthritis, unspecified site: Secondary | ICD-10-CM | POA: Insufficient documentation

## 2023-06-08 HISTORY — PX: BREAST LUMPECTOMY WITH RADIOACTIVE SEED LOCALIZATION: SHX6424

## 2023-06-08 SURGERY — BREAST LUMPECTOMY WITH RADIOACTIVE SEED LOCALIZATION
Anesthesia: General | Laterality: Left

## 2023-06-08 MED ORDER — DEXAMETHASONE SODIUM PHOSPHATE 10 MG/ML IJ SOLN
INTRAMUSCULAR | Status: DC | PRN
  Administered 2023-06-08: 5 mg via INTRAVENOUS

## 2023-06-08 MED ORDER — EPHEDRINE SULFATE-NACL 50-0.9 MG/10ML-% IV SOSY
PREFILLED_SYRINGE | INTRAVENOUS | Status: DC | PRN
  Administered 2023-06-08 (×2): 5 mg via INTRAVENOUS

## 2023-06-08 MED ORDER — ENSURE PRE-SURGERY PO LIQD: 296.0000 mL | Freq: Once | ORAL | Status: DC

## 2023-06-08 MED ORDER — DEXAMETHASONE SODIUM PHOSPHATE 10 MG/ML IJ SOLN
INTRAMUSCULAR | Status: AC
  Filled 2023-06-08: qty 1

## 2023-06-08 MED ORDER — BUPIVACAINE-EPINEPHRINE (PF) 0.25% -1:200000 IJ SOLN
INTRAMUSCULAR | Status: AC
  Filled 2023-06-08: qty 30

## 2023-06-08 MED ORDER — CHLORHEXIDINE GLUCONATE 0.12 % MT SOLN
15.0000 mL | Freq: Once | OROMUCOSAL | Status: AC
  Administered 2023-06-08: 15 mL via OROMUCOSAL
  Filled 2023-06-08: qty 15

## 2023-06-08 MED ORDER — EPHEDRINE 5 MG/ML INJ
INTRAVENOUS | Status: AC
  Filled 2023-06-08: qty 5

## 2023-06-08 MED ORDER — LIDOCAINE 2% (20 MG/ML) 5 ML SYRINGE
INTRAMUSCULAR | Status: AC
Start: 2023-06-08 — End: ?
  Filled 2023-06-08: qty 5

## 2023-06-08 MED ORDER — CEFAZOLIN SODIUM-DEXTROSE 2-4 GM/100ML-% IV SOLN
2.0000 g | INTRAVENOUS | Status: AC
  Administered 2023-06-08: 2 g via INTRAVENOUS
  Filled 2023-06-08: qty 100

## 2023-06-08 MED ORDER — ONDANSETRON HCL 4 MG/2ML IJ SOLN
INTRAMUSCULAR | Status: DC | PRN
  Administered 2023-06-08: 4 mg via INTRAVENOUS

## 2023-06-08 MED ORDER — PROPOFOL 10 MG/ML IV BOLUS
INTRAVENOUS | Status: DC | PRN
  Administered 2023-06-08: 100 mg via INTRAVENOUS

## 2023-06-08 MED ORDER — CHLORHEXIDINE GLUCONATE CLOTH 2 % EX PADS: 6.0000 | MEDICATED_PAD | Freq: Once | CUTANEOUS | Status: DC

## 2023-06-08 MED ORDER — PROPOFOL 10 MG/ML IV BOLUS
INTRAVENOUS | Status: AC
  Filled 2023-06-08: qty 20

## 2023-06-08 MED ORDER — BUPIVACAINE-EPINEPHRINE 0.25% -1:200000 IJ SOLN
INTRAMUSCULAR | Status: DC | PRN
  Administered 2023-06-08: 10 mL

## 2023-06-08 MED ORDER — ACETAMINOPHEN 500 MG PO TABS
1000.0000 mg | ORAL_TABLET | ORAL | Status: AC
  Administered 2023-06-08: 1000 mg via ORAL
  Filled 2023-06-08: qty 2

## 2023-06-08 MED ORDER — MIDAZOLAM HCL 2 MG/2ML IJ SOLN
INTRAMUSCULAR | Status: AC
Start: 2023-06-08 — End: ?
  Filled 2023-06-08: qty 2

## 2023-06-08 MED ORDER — ONDANSETRON HCL 4 MG/2ML IJ SOLN: 4.0000 mg | Freq: Once | INTRAMUSCULAR | Status: DC | PRN

## 2023-06-08 MED ORDER — LIDOCAINE 2% (20 MG/ML) 5 ML SYRINGE
INTRAMUSCULAR | Status: DC | PRN
  Administered 2023-06-08: 60 mg via INTRAVENOUS

## 2023-06-08 MED ORDER — SODIUM CHLORIDE 0.9 % IV SOLN: INTRAVENOUS | Status: DC | PRN

## 2023-06-08 MED ORDER — FENTANYL CITRATE (PF) 100 MCG/2ML IJ SOLN
INTRAMUSCULAR | Status: AC
  Filled 2023-06-08: qty 2

## 2023-06-08 MED ORDER — 0.9 % SODIUM CHLORIDE (POUR BTL) OPTIME
TOPICAL | Status: DC | PRN
  Administered 2023-06-08: 1000 mL

## 2023-06-08 MED ORDER — ONDANSETRON HCL 4 MG/2ML IJ SOLN
INTRAMUSCULAR | Status: AC
  Filled 2023-06-08: qty 2

## 2023-06-08 MED ORDER — LACTATED RINGERS IV SOLN: INTRAVENOUS | Status: DC

## 2023-06-08 MED ORDER — FENTANYL CITRATE (PF) 250 MCG/5ML IJ SOLN
INTRAMUSCULAR | Status: DC | PRN
  Administered 2023-06-08 (×3): 25 ug via INTRAVENOUS

## 2023-06-08 MED ORDER — FENTANYL CITRATE (PF) 100 MCG/2ML IJ SOLN
25.0000 ug | INTRAMUSCULAR | Status: DC | PRN
  Administered 2023-06-08: 50 ug via INTRAVENOUS

## 2023-06-08 MED ORDER — FENTANYL CITRATE (PF) 250 MCG/5ML IJ SOLN
INTRAMUSCULAR | Status: AC
  Filled 2023-06-08: qty 5

## 2023-06-08 MED ORDER — ORAL CARE MOUTH RINSE: 15.0000 mL | Freq: Once | OROMUCOSAL | Status: AC

## 2023-06-08 SURGICAL SUPPLY — 36 items
APPLIER CLIP 9.375 MED OPEN (MISCELLANEOUS)
BAG COUNTER SPONGE SURGICOUNT (BAG) ×1 IMPLANT
BINDER BREAST LRG (GAUZE/BANDAGES/DRESSINGS) IMPLANT
BINDER BREAST XLRG (GAUZE/BANDAGES/DRESSINGS) IMPLANT
CANISTER SUCT 3000ML PPV (MISCELLANEOUS) ×1 IMPLANT
CHLORAPREP W/TINT 26 (MISCELLANEOUS) ×1 IMPLANT
CLIP APPLIE 9.375 MED OPEN (MISCELLANEOUS) IMPLANT
CLIP TI MEDIUM 6 (CLIP) ×1 IMPLANT
COVER PROBE W GEL 5X96 (DRAPES) ×1 IMPLANT
COVER SURGICAL LIGHT HANDLE (MISCELLANEOUS) ×1 IMPLANT
DERMABOND ADVANCED .7 DNX12 (GAUZE/BANDAGES/DRESSINGS) ×1 IMPLANT
DEVICE DUBIN SPECIMEN MAMMOGRA (MISCELLANEOUS) ×1 IMPLANT
DRAPE CHEST BREAST 15X10 FENES (DRAPES) ×1 IMPLANT
ELECT COATED BLADE 2.86 ST (ELECTRODE) ×1 IMPLANT
ELECT REM PT RETURN 9FT ADLT (ELECTROSURGICAL) ×1
ELECTRODE REM PT RTRN 9FT ADLT (ELECTROSURGICAL) ×1 IMPLANT
GLOVE BIO SURGEON STRL SZ7 (GLOVE) ×2 IMPLANT
GLOVE BIOGEL PI IND STRL 7.5 (GLOVE) ×1 IMPLANT
GOWN STRL REUS W/ TWL LRG LVL3 (GOWN DISPOSABLE) ×2 IMPLANT
KIT BASIN OR (CUSTOM PROCEDURE TRAY) ×1 IMPLANT
KIT MARKER MARGIN INK (KITS) ×1 IMPLANT
LIGHT WAVEGUIDE WIDE FLAT (MISCELLANEOUS) IMPLANT
NDL HYPO 25GX1X1/2 BEV (NEEDLE) ×1 IMPLANT
NEEDLE HYPO 25GX1X1/2 BEV (NEEDLE) ×1
NS IRRIG 1000ML POUR BTL (IV SOLUTION) ×1 IMPLANT
PACK GENERAL/GYN (CUSTOM PROCEDURE TRAY) ×1 IMPLANT
STRIP CLOSURE SKIN 1/2X4 (GAUZE/BANDAGES/DRESSINGS) ×1 IMPLANT
SUT MNCRL AB 4-0 PS2 18 (SUTURE) ×1 IMPLANT
SUT MON AB 4-0 PC3 18 (SUTURE) IMPLANT
SUT MON AB 5-0 PS2 18 (SUTURE) IMPLANT
SUT SILK 2 0 SH (SUTURE) IMPLANT
SUT VIC AB 2-0 SH 27XBRD (SUTURE) ×1 IMPLANT
SUT VIC AB 3-0 SH 27X BRD (SUTURE) ×1 IMPLANT
SYR CONTROL 10ML LL (SYRINGE) ×1 IMPLANT
TOWEL GREEN STERILE (TOWEL DISPOSABLE) ×1 IMPLANT
TOWEL GREEN STERILE FF (TOWEL DISPOSABLE) ×1 IMPLANT

## 2023-06-08 NOTE — Transfer of Care (Signed)
Immediate Anesthesia Transfer of Care Note  Patient: Casey Kirk  Procedure(s) Performed: LEFT BREAST LUMPECTOMY WITH RADIOACTIVE SEED LOCALIZATION (Left)  Patient Location: PACU  Anesthesia Type:General  Level of Consciousness: drowsy  Airway & Oxygen Therapy: Patient Spontanous Breathing  Post-op Assessment: Report given to RN and Post -op Vital signs reviewed and stable  Post vital signs: Reviewed and stable  Last Vitals:  Vitals Value Taken Time  BP 131/70   Temp    Pulse 58 06/08/23 0819  Resp 15   SpO2 97 % 06/08/23 0819  Vitals shown include unfiled device data.  Last Pain:  Vitals:   06/08/23 0623  TempSrc:   PainSc: 0-No pain         Complications: No notable events documented.

## 2023-06-08 NOTE — Anesthesia Procedure Notes (Signed)
Procedure Name: LMA Insertion Date/Time: 06/08/2023 7:32 AM  Performed by: Randon Goldsmith, CRNAPre-anesthesia Checklist: Patient identified, Emergency Drugs available, Suction available and Patient being monitored Patient Re-evaluated:Patient Re-evaluated prior to induction Oxygen Delivery Method: Simple face mask Preoxygenation: Pre-oxygenation with 100% oxygen Induction Type: IV induction LMA: LMA flexible inserted LMA Size: 4.0 Number of attempts: 1 Airway Equipment and Method: Bite block Placement Confirmation: positive ETCO2 and breath sounds checked- equal and bilateral Tube secured with: Tape Dental Injury: Teeth and Oropharynx as per pre-operative assessment

## 2023-06-08 NOTE — Op Note (Signed)
Preoperative diagnosis:Left breast cancer, clinical stage I Postoperative diagnosis: Same as above Procedure: Left breast seed guided lumpectomy Surgeon: Dr. Harden Mo Specimens: left breast tissue containing seed and clip, additional superior and lateral margins marked short superior, long lateral double deep Anesthesia: General Complications: None Drains: None Estimated blood loss: Minimal Sponge needle count was correct completion Disposition recovery stable condition   Indications: 65 yof with copd, cad who had screening mm that shows a focal left breast asymmetry. She has b density breast tissue. This is a 7 mm asymmetry by mm and a 5 mm mass on [place]. Her axilla is negative by [place]. Biopsy is a grade I IDC that is 95% er pos, 100% pr pos, her 2 negative and Ki is < 1%. we discussed lumpectomy alone.    Procedure: After informed consent was obtained patient was taken to the operating room.  She was given antibiotics.  SCDs were in place.  She was placed under general anesthesia without complication.  She was prepped and draped in a standard sterile surgical fashion.   The seed was in the lateral breast.  I made a curvilinear incision overlying the mass after infiltrating marcaine as the seed was close to the skin.  I used the neoprobe to guide the excision of the seed in the surrounding tissue.  Mammogram confirmed removal of the clip and the seed.  I did remove additional margins as above.  The anterior margin is skin.   I then obtained hemostasis.  I closed the breast tissue with 2-0 Vicryl.  The skin was closed with 3-0 Vicryl and 4-0 Monocryl.  Glue and Steri-Strips were applied.  She tolerated this well was extubated and transferred recovery stable.

## 2023-06-08 NOTE — Discharge Instructions (Signed)
[facility] Office Phone Number [phone]  POST OP INSTRUCTIONS Take 400 mg of ibuprofen every 8 hours or 650 mg tylenol every 6 hours for next 72 hours then as needed. Use ice several times daily also.  A prescription for pain medication may be given to you upon discharge.  Take your pain medication as prescribed, if needed.  If narcotic pain medicine is not needed, then you may take acetaminophen (Tylenol), naprosyn (Alleve) or ibuprofen (Advil) as needed. Take your usually prescribed medications unless otherwise directed If you need a refill on your pain medication, please contact your pharmacy.  They will contact our office to request authorization.  Prescriptions will not be filled after 5pm or on week-ends. You should eat very light the first 24 hours after surgery, such as soup, crackers, pudding, etc.  Resume your normal diet the day after surgery. Most patients will experience some swelling and bruising in the breast.  Ice packs and a good support bra will help.  Wear the breast binder provided or a sports bra for 72 hours day and night.  After that wear a sports bra during the day until you return to the office. Swelling and bruising can take several days to resolve.  It is common to experience some constipation if taking pain medication after surgery.  Increasing fluid intake and taking a stool softener will usually help or prevent this problem from occurring.  A mild laxative (Milk of Magnesia or Miralax) should be taken according to package directions if there are no bowel movements after 48 hours. I used skin glue on the incision, you may shower in 24 hours.  The glue will flake off over the next 2-3 weeks.  Any sutures or staples will be removed at the office during your follow-up visit. ACTIVITIES:  You may resume regular daily activities (gradually increasing) beginning the next day.  Wearing a good support bra or sports bra minimizes pain and swelling.  You may have  sexual intercourse when it is comfortable. You may drive when you no longer are taking prescription pain medication, you can comfortably wear a seatbelt, and you can safely maneuver your car and apply brakes. RETURN TO WORK:  ______________________________________________________________________________________ You should see your doctor in the office for a follow-up appointment approximately two weeks after your surgery.  Your doctor's nurse will typically make your follow-up appointment when she calls you with your pathology report.  Expect your pathology report 3-4 business days after your surgery.  You may call to check if you do not hear from us after three days. OTHER INSTRUCTIONS: _______________________________________________________________________________________________ _____________________________________________________________________________________________________________________________________ _____________________________________________________________________________________________________________________________________ _____________________________________________________________________________________________________________________________________  WHEN TO CALL DR Jomari Bartnik: Fever over 101.0 Nausea and/or vomiting. Extreme swelling or bruising. Continued bleeding from incision. Increased pain, redness, or drainage from the incision.  The clinic staff is available to answer your questions during regular business hours.  Please don't hesitate to call and ask to speak to one of the nurses for clinical concerns.  If you have a medical emergency, go to the nearest emergency room or call 911.  A surgeon from [facility] is always on call at the hospital.  For further questions, please visit [email or web address]  

## 2023-06-08 NOTE — Interval H&P Note (Signed)
History and Physical Interval Note:  06/08/2023 7:06 AM  Casey Kirk  has presented today for surgery, with the diagnosis of LEFT BREAST CANCER.  The various methods of treatment have been discussed with the patient and family. After consideration of risks, benefits and other options for treatment, the patient has consented to  Procedure(s): LEFT BREAST LUMPECTOMY WITH RADIOACTIVE SEED LOCALIZATION (Left) as a surgical intervention.  The patient's history has been reviewed, patient examined, no change in status, stable for surgery.  I have reviewed the patient's chart and labs.  Questions were answered to the patient's satisfaction.     Emelia Loron

## 2023-06-10 NOTE — Anesthesia Postprocedure Evaluation (Signed)
Anesthesia Post Note  Patient: Casey Kirk  Procedure(s) Performed: LEFT BREAST LUMPECTOMY WITH RADIOACTIVE SEED LOCALIZATION (Left)     Patient location during evaluation: PACU Anesthesia Type: General Level of consciousness: awake and alert Pain management: pain level controlled Vital Signs Assessment: post-procedure vital signs reviewed and stable Respiratory status: spontaneous breathing, nonlabored ventilation, respiratory function stable and patient connected to nasal cannula oxygen Cardiovascular status: blood pressure returned to baseline and stable Postop Assessment: no apparent nausea or vomiting Anesthetic complications: no   No notable events documented.  Last Vitals:  Vitals:   06/08/23 1000 06/08/23 1015  BP: (!) 142/77 (!) 150/70  Pulse: (!) 58 63  Resp: (!) 24 20  Temp:  36.7 C  SpO2: 97% 97%    Last Pain:  Vitals:   06/08/23 1015  TempSrc:   PainSc: 3                  Collene Schlichter

## 2023-06-11 ENCOUNTER — Telehealth (HOSPITAL_BASED_OUTPATIENT_CLINIC_OR_DEPARTMENT_OTHER): Payer: Self-pay | Admitting: *Deleted

## 2023-06-11 ENCOUNTER — Ambulatory Visit (HOSPITAL_BASED_OUTPATIENT_CLINIC_OR_DEPARTMENT_OTHER): Payer: PPO | Admitting: Cardiology

## 2023-06-11 ENCOUNTER — Other Ambulatory Visit (HOSPITAL_BASED_OUTPATIENT_CLINIC_OR_DEPARTMENT_OTHER): Payer: PPO

## 2023-06-11 ENCOUNTER — Encounter (HOSPITAL_COMMUNITY): Payer: Self-pay | Admitting: General Surgery

## 2023-06-11 VITALS — BP 160/82 | HR 49 | Ht 62.0 in | Wt 134.0 lb

## 2023-06-11 DIAGNOSIS — I25110 Atherosclerotic heart disease of native coronary artery with unstable angina pectoris: Secondary | ICD-10-CM | POA: Diagnosis not present

## 2023-06-11 DIAGNOSIS — I700 Atherosclerosis of aorta: Secondary | ICD-10-CM | POA: Diagnosis not present

## 2023-06-11 DIAGNOSIS — R001 Bradycardia, unspecified: Secondary | ICD-10-CM

## 2023-06-11 DIAGNOSIS — R002 Palpitations: Secondary | ICD-10-CM

## 2023-06-11 LAB — SURGICAL PATHOLOGY

## 2023-06-11 NOTE — Telephone Encounter (Signed)
Spoke with patient and reviewed recommendations  1-Patient wanted to know if Dr Anne Fu was going to give her anything for her elevated blood pressure especially since stopping Metoprolol   2-Looked up side effects on Tizanidine and bradycardia listed, usually takes only 1 a day   Will forward to Dr Anne Fu for review

## 2023-06-11 NOTE — Telephone Encounter (Signed)
June 11, 2023 Jake Bathe, MD to Me     06/11/23  4:28 PM No need to replace metoprolol with a another medication at this point. Metoprolol in and of itself not affect blood pressure that much regardless.  Advised patient, verbalized understanding  Patient anxious about her blood pressure readings Advised ok to take blood pressure twice a day for 2 weeks and call with updated readings

## 2023-06-11 NOTE — Patient Instructions (Addendum)
Medication Instructions:  Your physician recommends that you continue on your current medications as directed. Please refer to the Current Medication list given to you today.   *If you need a refill on your cardiac medications before your next appointment, please call your pharmacy*  Lab Work: NONE    Testing/Procedures: 14 DAY ZIO   Follow-Up: At [facility], you and your health needs are our priority.  As part of our continuing mission to provide you with exceptional heart care, we have created designated Provider Care Teams.  These Care Teams include your primary Cardiologist (physician) and Advanced Practice Providers (APPs -  Physician Assistants and Nurse Practitioners) who all work together to provide you with the care you need, when you need it.  We recommend signing up for the patient portal called "MyChart".  Sign up information is provided on this After Visit Summary.  MyChart is used to connect with patients for Virtual Visits (Telemedicine).  Patients are able to view lab/test results, encounter notes, upcoming appointments, etc.  Non-urgent messages can be sent to your provider as well.   To learn more about what you can do with MyChart, go to [email or web address].    Your next appointment:   12 month(s)  Provider:   Donato Schultz, MD    Other Instructions  Casey Kirk- Long Term Monitor Instructions  Your physician has requested you wear a ZIO patch monitor for 14 days.  This is a single patch monitor. [employer] supplies one patch monitor per enrollment. Additional stickers are not available. Please do not apply patch if you will be having a Nuclear Stress Test,  Echocardiogram, Cardiac CT, MRI, or Chest Xray during the period you would be wearing the  monitor. The patch cannot be worn during these tests. You cannot remove and re-apply the  ZIO XT patch monitor.  Your ZIO patch monitor will be mailed 3 day USPS to your address on file. It may take 3-5 days  to  receive your monitor after you have been enrolled.  Once you have received your monitor, please review the enclosed instructions. Your monitor  has already been registered assigning a specific monitor serial # to you.  Billing and Patient Assistance Program Information  We have supplied [employer] with any of your insurance information on file for billing purposes. [employer] offers a sliding scale Patient Assistance Program for patients that do not have  insurance, or whose insurance does not completely cover the cost of the ZIO monitor.  You must apply for the Patient Assistance Program to qualify for this discounted rate.  To apply, please call [employer] at [phone], select option 4, select option 2, ask to apply for  Patient Assistance Program. Casey Kirk will ask your household income, and how many people  are in your household. They will quote your out-of-pocket cost based on that information.  [employer] will also be able to set up a 56-month, interest-free payment plan if needed.  Applying the monitor   Shave hair from upper left chest.  Hold abrader disc by orange tab. Rub abrader in 40 strokes over the upper left chest as  indicated in your monitor instructions.  Clean area with 4 enclosed alcohol pads. Let dry.  Apply patch as indicated in monitor instructions. Patch will be placed under collarbone on left  side of chest with arrow pointing upward.  Rub patch adhesive wings for 2 minutes. Remove white label marked "1". Remove the white  label marked "2". Rub patch adhesive wings for 2  additional minutes.  While looking in a mirror, press and release button in center of patch. A small green light will  flash 3-4 times. This will be your only indicator that the monitor has been turned on.  Do not shower for the first 24 hours. You may shower after the first 24 hours.  Press the button if you feel a symptom. You will hear a small click. Record Date, Time and  Symptom in the Patient  Logbook.  When you are ready to remove the patch, follow instructions on the last 2 pages of Patient  Logbook. Stick patch monitor onto the last page of Patient Logbook.  Place Patient Logbook in the blue and white box. Use locking tab on box and tape box closed  securely. The blue and white box has prepaid postage on it. Please place it in the mailbox as  soon as possible. Your physician should have your test results approximately 7 days after the  monitor has been mailed back to [facility].  Call [facility] Customer Care at [phone] if you have questions regarding  your ZIO XT patch monitor. Call them immediately if you see an orange light blinking on your  monitor.  If your monitor falls off in less than 4 days, contact our Monitor department at [phone].  If your monitor becomes loose or falls off after 4 days call [employer] at [phone] for  suggestions on securing your monitor

## 2023-06-11 NOTE — Progress Notes (Addendum)
Cardiology Office Note:  .   Date:  06/11/2023  ID:  Casey Kirk, DOB 1945-12-23, MRN [id number] PCP: Daisy Floro, MD  [facility] Providers Cardiologist:  Donato Schultz, MD     History of Present Illness: .   Casey Kirk is a 78 y.o. female Discussed with the use of AI scribe   History of Present Illness   The 78 year old patient with a history of coronary artery disease, COPD, bipolar disorder, GERD, IBS, and hyperlipidemia, presents for a follow-up visit. The patient had a cardiac catheterization in 2018, which revealed an 80% mid LAD lesion, and a drug-eluting stent was placed. The patient has been experiencing occasional chest and throat tightness, particularly when fatigued or using stairs. A nuclear stress test in 2018 showed a small inferior apical wall infarction, but overall risk was deemed low.  The patient has been on Crestor 20mg  daily, with a prior LDL of 36, and is also on aspirin. The patient has been experiencing irregular heartbeats, described as a "bump and pause" rhythm, which has been causing concern. This irregularity was noted by an anesthesiologist during a recent breast surgery, who reported the heart was pausing. The patient denies any fainting/syncopal episodes but reports feeling weak and easily fatigued.  The patient's heart irregularity has been monitored previously, with a noted 3.8 second pause during sleep. The patient has not been diagnosed with atrial fibrillation, but due to the recent concerns raised, another heart monitor will be used to check for progression or changes.     Here with husband. Worried.      ROS: No syncope  Studies Reviewed: .        Results   LABS LDL: 36  RADIOLOGY Carotid Doppler: Mild plaque (2018)  DIAGNOSTIC Cardiac catheterization: 80% mid LAD lesion, drug-eluting stent placed (2018) ABIs: Normal (2018) Event monitor: 3.8-second pause at 5:40 AM during sleep Nuclear stress test: Small inferior  apical wall infarction, overall low risk (2018) EKG: Premature atrial contraction     Risk Assessment/Calculations:           Physical Exam:   VS:  BP (!) 160/82 (BP Location: Right Arm, Patient Position: Sitting, Cuff Size: Normal)   Pulse (!) 49   Ht 5\' 2"  (1.575 m)   Wt 134 lb (60.8 kg)   SpO2 99%   BMI 24.51 kg/m    Wt Readings from Last 3 Encounters:  06/11/23 134 lb (60.8 kg)  06/08/23 130 lb (59 kg)  06/05/23 129 lb 12.8 oz (58.9 kg)    GEN: Well nourished, well developed in no acute distress NECK: No JVD; No carotid bruits CARDIAC: RRR with ectopy, no murmurs, no rubs, no gallops RESPIRATORY:  Clear to auscultation without rales, wheezing or rhonchi  ABDOMEN: Soft, non-tender, non-distended EXTREMITIES:  No edema; No deformity   ASSESSMENT AND PLAN: .    Assessment and Plan    Coronary Artery Disease (CAD) Follow-up for CAD with 80% mid LAD lesion, drug-eluting stent placed in 2018. Occasional chest and throat tightness with exertion. 2018 nuclear stress test showed small inferior apical wall infarction, overall low risk. LDL previously 36 on Crestor 20 mg daily. On aspirin. Tobacco cessation encouraged. - Continue Crestor 20 mg daily - Continue aspirin - Encourage tobacco cessation  Cardiac Arrhythmia prior pause, PAC's 3.8-second pause during sleep on prior event monitor. Recent concerns about heart pauses during breast surgery noted by Anesthesia team. No syncope but reports fatigue and weakness. Differential includes benign premature beats  and AFib. Explained anesthesia can mimic sleep conditions, necessitating repeat monitoring. - Order Zio monitor for 2 weeks - Follow up with monitor results - Avoid AV nodal blocking agents. Stop metoprolol succinate 25mg  every day. -Consider sleep study  Hyperlipidemia Hyperlipidemia managed with Crestor 20 mg daily, prior LDL 36. - Continue Crestor 20 mg daily  Chronic Obstructive Pulmonary Disease (COPD) COPD, no acute  issues discussed. - Continue current management  Bipolar Disorder Bipolar disorder, no acute issues discussed. - Continue current management  Gastroesophageal Reflux Disease (GERD) GERD, no acute issues discussed. - Continue current management  Irritable Bowel Syndrome (IBS) IBS, no acute issues discussed. - Continue current management  General Health Maintenance Tobacco cessation encouraged. - Encourage tobacco cessation.             Signed, Donato Schultz, MD

## 2023-06-11 NOTE — Telephone Encounter (Signed)
Dr Anne Fu reviewed patients chart further after visit today He would like for patient to stop Metoprolol   Left message to call back

## 2023-06-11 NOTE — Telephone Encounter (Signed)
Follow Up:    Patient is returning Melinda's call from today(06-11-23).

## 2023-06-13 ENCOUNTER — Telehealth: Payer: Self-pay | Admitting: *Deleted

## 2023-06-13 ENCOUNTER — Encounter: Payer: Self-pay | Admitting: *Deleted

## 2023-06-13 ENCOUNTER — Other Ambulatory Visit: Payer: PPO

## 2023-06-13 NOTE — Telephone Encounter (Signed)
Received order for oncotype testing. Requisition sent to pathology 

## 2023-06-18 ENCOUNTER — Inpatient Hospital Stay: Payer: PPO | Attending: Hematology and Oncology | Admitting: Hematology and Oncology

## 2023-06-18 VITALS — BP 152/65 | HR 73 | Temp 97.7°F | Resp 18 | Wt 132.6 lb

## 2023-06-18 DIAGNOSIS — M810 Age-related osteoporosis without current pathological fracture: Secondary | ICD-10-CM | POA: Diagnosis not present

## 2023-06-18 DIAGNOSIS — F17210 Nicotine dependence, cigarettes, uncomplicated: Secondary | ICD-10-CM | POA: Diagnosis not present

## 2023-06-18 DIAGNOSIS — C50412 Malignant neoplasm of upper-outer quadrant of left female breast: Secondary | ICD-10-CM | POA: Diagnosis not present

## 2023-06-18 DIAGNOSIS — L7634 Postprocedural seroma of skin and subcutaneous tissue following other procedure: Secondary | ICD-10-CM | POA: Diagnosis not present

## 2023-06-18 DIAGNOSIS — I499 Cardiac arrhythmia, unspecified: Secondary | ICD-10-CM | POA: Diagnosis not present

## 2023-06-18 DIAGNOSIS — Z170 Estrogen receptor positive status [ER+]: Secondary | ICD-10-CM | POA: Diagnosis not present

## 2023-06-18 NOTE — Progress Notes (Signed)
[facility] CONSULT NOTE  Patient Care Team: Daisy Floro, MD as PCP - General (Family Medicine) Jake Bathe, MD as PCP - Cardiology (Cardiology) Pershing Proud, RN as Oncology Nurse Navigator Donnelly Angelica, RN as Oncology Nurse Navigator Emelia Loron, MD as Consulting Physician (General Surgery) Rachel Moulds, MD as Consulting Physician (Hematology and Oncology) Antony Blackbird, MD as Consulting Physician (Radiation Oncology)  CHIEF COMPLAINTS/PURPOSE OF CONSULTATION:  Newly diagnosed breast cancer  HISTORY OF PRESENTING ILLNESS:  Casey Kirk 78 y.o. female is here because of recent diagnosis of left breast cancer  I reviewed her records extensively and collaborated the history with the patient.  SUMMARY OF ONCOLOGIC HISTORY: Oncology History  Malignant neoplasm of upper-outer quadrant of left breast in female, estrogen receptor positive (HCC)  03/28/2023 Mammogram   Screening mammogram showed focal asymmetry in the left breast, indeterminate, additional views with possible ultrasound are recommended.  Diagnostic mammogram confirmed a 7 mm focal asymmetry in the left breast.   04/25/2023 Pathology Results   Left breast needle core biopsy, mass at 3:00 5 cm from the nipple showed overall grade 1 invasive ductal carcinoma, prognostic showed estrogen 95% positive strong staining, progesterone 100% positive strong staining, Ki-67 of less than 1% and HER2 0   05/07/2023 Initial Diagnosis   Malignant neoplasm of upper-outer quadrant of left breast in female, estrogen receptor positive (HCC)   05/09/2023 Cancer Staging   Staging form: Breast, AJCC 8th Edition - Clinical stage from 05/09/2023: Stage IA (cT1a, cN0, cM0, G1, ER+, PR+, HER2-) - Signed by Rachel Moulds, MD on 05/09/2023 Stage prefix: Initial diagnosis Histologic grading system: 3 grade system    Discussed the use of AI scribe software for clinical note transcription with the patient,  who gave verbal consent to proceed.  History of Present Illness          The patient, with a history of breast cancer, underwent a recent surgery. Postoperatively, she experienced cardiac irregularities, described as a slow and erratic heart rate. This was a new development, as a cardiac evaluation a year prior had been unremarkable. The patient was placed on a heart monitor for two weeks.   The breast cancer surgery was successful in removing the tumor, which was estimated at 1.7 cm. The tumor was sent for Oncotype testing to provide more detailed information about its behavior.  The patient also reported new onset of swelling and pain at the surgical site, which was attributed to overuse of the arms and fluid accumulation.  In addition to the breast cancer and cardiac irregularities, the patient has a history of osteoporosis, which is currently untreated. She was advised to seek treatment for osteoporosis, particularly in light of potential future treatment with anastrozole, a medication that can exacerbate bone density loss. The patient also continues to smoke, which further complicates her treatment options due to increased risk of blood clots with tamoxifen and concomitant smoking.  MEDICAL HISTORY:  Past Medical History:  Diagnosis Date   Allergic rhinitis    Aortic atherosclerosis (HCC) 08/17/2016   Arthritis    ARTHRITIS, HX OF 01/09/2007   BIPOLAR II DISORDER 03/17/2008   Breast cancer (HCC)    Carcinoma of vaginal vault (HCC)    COPD 12/21/2006   COPD (chronic obstructive pulmonary disease) (HCC) 12/21/2006   Emphysema on CT 06/2016 02/2016 FEV1 of 98%     Coronary artery calcification 08/17/2016   Coronary artery disease involving native coronary artery of native heart with unstable angina  pectoris (HCC)    Dysphasia 05/10/2011   Family history of early CAD 08/17/2016   GERD (gastroesophageal reflux disease)    GOUT 01/09/2007   Hx of colonic polyps    Hypertension     Hypothyroidism 10/17/2010   IBS (irritable bowel syndrome)    Osteoporosis    Sinusitis    Thyroid disease    TINEA CORPORIS 10/06/2009   Tobacco abuse 05/10/2011    SURGICAL HISTORY: Past Surgical History:  Procedure Laterality Date   ABDOMINAL HYSTERECTOMY     CA cells removed end of vagina   BREAST LUMPECTOMY WITH RADIOACTIVE SEED LOCALIZATION Left 06/08/2023   Procedure: LEFT BREAST LUMPECTOMY WITH RADIOACTIVE SEED LOCALIZATION;  Surgeon: Emelia Loron, MD;  Location: [facility] OR;  Service: General;  Laterality: Left;   BREAST SURGERY     biopsy   COLONOSCOPY     CORONARY PRESSURE/FFR STUDY N/A 03/16/2017   Procedure: INTRAVASCULAR PRESSURE WIRE/FFR STUDY;  Surgeon: Kathleene Hazel, MD;  Location: [facility] INVASIVE CV LAB;  Service: Cardiovascular;  Laterality: N/A;   CORONARY STENT INTERVENTION N/A 03/16/2017   Procedure: CORONARY STENT INTERVENTION;  Surgeon: Kathleene Hazel, MD;  Location: [facility] INVASIVE CV LAB;  Service: Cardiovascular;  Laterality: N/A;   EYE SURGERY Right    cataract   LEFT HEART CATH AND CORONARY ANGIOGRAPHY N/A 03/16/2017   Procedure: LEFT HEART CATH AND CORONARY ANGIOGRAPHY;  Surgeon: Kathleene Hazel, MD;  Location: [facility] INVASIVE CV LAB;  Service: Cardiovascular;  Laterality: N/A;   LEFT HEART CATH AND CORONARY ANGIOGRAPHY N/A 01/30/2018   Procedure: LEFT HEART CATH AND CORONARY ANGIOGRAPHY;  Surgeon: Kathleene Hazel, MD;  Location: [facility] INVASIVE CV LAB;  Service: Cardiovascular;  Laterality: N/A;   MASS EXCISION Right 05/10/2018   Procedure: EXCISION MASS OF RIGHT PALATAL  WITH LOCAL SOFT TISSUE RECONSTRUCTION;  Surgeon: Osborn Coho, MD;  Location: [facility] OR;  Service: ENT;  Laterality: Right;   TOOTH EXTRACTION     vaginal carcinoma surgery      SOCIAL HISTORY: Social History   Socioeconomic History   Marital status: Married    Spouse name: Not on file   Number of children: 2   Years of education: Not on file   Highest  education level: Not on file  Occupational History   Occupation: retired  Tobacco Use   Smoking status: Every Day    Current packs/day: 0.20    Average packs/day: 0.2 packs/day for 40.0 years (8.0 ttl pk-yrs)    Types: Cigarettes   Smokeless tobacco: Never  Vaping Use   Vaping status: Never Used  Substance and Sexual Activity   Alcohol use: Not Currently    Comment: Rarely    Drug use: No   Sexual activity: Yes  Other Topics Concern   Not on file  Social History Narrative   Lives alone.  She has two grown children.   She is retired from working at [employer].   Highest level of education:  2 year business college   Social Drivers of Health   Financial Resource Strain: Not on file  Food Insecurity: No Food Insecurity (05/09/2023)   Hunger Vital Sign    Worried About Running Out of Food in the Last Year: Never true    Ran Out of Food in the Last Year: Never true  Transportation Needs: No Transportation Needs (05/09/2023)   PRAPARE - Administrator, Civil Service (Medical): No    Lack of Transportation (Non-Medical): No  Physical Activity: Not on file  Stress: Not on file  Social Connections: Not on file  Intimate Partner Violence: Not At Risk (05/09/2023)   Humiliation, Afraid, Rape, and Kick questionnaire    Fear of Current or Ex-Partner: No    Emotionally Abused: No    Physically Abused: No    Sexually Abused: No    FAMILY HISTORY: Family History  Problem Relation Age of Onset   Colon polyps Mother        unknown number   Tremor Mother    COPD Mother    Colon polyps Brother        unknown number   Heart attack Brother 64   Alcohol abuse Other    Arthritis Other    Diabetes Other    Hyperlipidemia Other    Hypertension Other    Heart disease Other    Sudden death Other    Pancreatic cancer Cousin        maternal female cousin   Colon cancer Neg Hx     ALLERGIES:  is allergic to hydrocodone-acetaminophen, latex, and pneumococcal polysaccharide  vaccine.  MEDICATIONS:  Current Outpatient Medications  Medication Sig Dispense Refill   acetaminophen (TYLENOL) 500 MG tablet Take 500 mg by mouth every 8 (eight) hours as needed (pain).     albuterol (PROVENTIL HFA;VENTOLIN HFA) 108 (90 Base) MCG/ACT inhaler Inhale 2 puffs into the lungs every 6 (six) hours as needed for wheezing or shortness of breath. 1 Inhaler 5   Ascorbic Acid (VITAMIN C PO) Take 1 tablet by mouth daily.     aspirin EC 81 MG tablet Take 81 mg by mouth daily.     Calcium Citrate-Vitamin D (CALCIUM + D PO) Take 1 tablet by mouth daily.     diphenhydrAMINE (BENADRYL) 25 mg capsule Take 25 mg by mouth daily as needed for allergies.     fluticasone (FLONASE) 50 MCG/ACT nasal spray Place 2 sprays into both nostrils daily as needed for allergies or rhinitis.     GARLIC PO Take 1 tablet by mouth 2 (two) times a week.     guaiFENesin (MUCINEX) 600 MG 12 hr tablet Take 600 mg by mouth as needed for cough or to loosen phlegm.     loperamide (IMODIUM A-D) 2 MG tablet Take 2 mg by mouth 4 (four) times daily as needed for diarrhea or loose stools.     melatonin 5 MG TABS Take 5 mg by mouth at bedtime.     nicotine polacrilex (NICORETTE) 2 MG gum Take 2 mg by mouth as needed for smoking cessation.     nitroGLYCERIN (NITROSTAT) 0.4 MG SL tablet Place 0.4 mg under the tongue every 5 (five) minutes as needed for chest pain.     Omega-3 Fatty Acids (FISH OIL PO) Take 1 capsule by mouth 3 (three) times a week.     rosuvastatin (CRESTOR) 20 MG tablet Take 20 mg by mouth daily.      tiZANidine (ZANAFLEX) 2 MG tablet Take 2 mg by mouth 3 (three) times daily as needed for muscle spasms.     traZODone (DESYREL) 50 MG tablet Take 0.5-1 tablets (25-50 mg total) by mouth at bedtime as needed. (Patient taking differently: Take 25-50 mg by mouth at bedtime.) 90 tablet 3   TURMERIC PO Take 1 tablet by mouth daily.     VITAMIN A PO Take 1 capsule by mouth daily.     VITAMIN E PO Take 1 capsule by  mouth daily.     No current facility-administered medications  for this visit.    REVIEW OF SYSTEMS:   Constitutional: Denies fevers, chills or abnormal night sweats Eyes: Denies blurriness of vision, double vision or watery eyes Ears, nose, mouth, throat, and face: Denies mucositis or sore throat Respiratory: Denies cough, dyspnea or wheezes Cardiovascular: Denies palpitation, chest discomfort or lower extremity swelling Gastrointestinal:  Denies nausea, heartburn or change in bowel habits Skin: Denies abnormal skin rashes Lymphatics: Denies new lymphadenopathy or easy bruising Neurological:Denies numbness, tingling or new weaknesses Behavioral/Psych: Mood is stable, no new changes  Breast: Denies any palpable lumps or discharge All other systems were reviewed with the patient and are negative.  PHYSICAL EXAMINATION: ECOG PERFORMANCE STATUS: 0 - Asymptomatic  Vitals:   06/18/23 0920  BP: (!) 152/65  Pulse: 73  Resp: 18  Temp: 97.7 F (36.5 C)  SpO2: 100%   Filed Weights   06/18/23 0920  Weight: 132 lb 9.6 oz (60.1 kg)    GENERAL:alert, no distress and comfortable, appears older than stated SKIN: skin color, texture, turgor are normal, no rashes or significant lesions EYES: normal, conjunctiva are pink and non-injected, sclera clear OROPHARYNX:no exudate, no erythema and lips, buccal mucosa, and tongue normal  NECK: supple, thyroid normal size, non-tender, without nodularity LYMPH:  no palpable lymphadenopathy in the cervical, axillary  LUNGS: clear to auscultation and percussion with normal breathing effort HEART: regular rate & rhythm and no murmurs and no lower extremity edema ABDOMEN:abdomen soft, non-tender and normal bowel sounds Musculoskeletal:no cyanosis of digits and no clubbing  PSYCH: alert & oriented x 3 with fluent speech NEURO: Her speech is a bit hard to understand.  She says, she has a speech disorder. BREAST: Left breast postbiopsy changes, no obvious  palpable mass.  LABORATORY DATA:  I have reviewed the data as listed Lab Results  Component Value Date   WBC 9.2 05/09/2023   HGB 15.5 (H) 05/09/2023   HCT 46.0 05/09/2023   MCV 87.3 05/09/2023   PLT 196 05/09/2023   Lab Results  Component Value Date   NA 141 05/09/2023   K 4.0 05/09/2023   CL 104 05/09/2023   CO2 30 05/09/2023    RADIOGRAPHIC STUDIES: I have personally reviewed the radiological reports and agreed with the findings in the report.  ASSESSMENT AND PLAN:  Malignant neoplasm of upper-outer quadrant of left breast in female, estrogen receptor positive (HCC) This is a very pleasant 78 year old female patient with newly diagnosed left breast grade 1 invasive ductal carcinoma, ER PR positive, HER2 0, Ki-67 of less than 1% referred to breast [facility] for additional recommendations.  Stage 1 Invasive Ductal Carcinoma (Left Breast)  Breast Cancer Post-operative status with complete tumor removal. Tumor size larger than anticipated (1.7 cm). Oncotype test sent, results pending. Discussed potential treatment options including radiation and hormonal therapy (anastrozole or tamoxifen). Patient has a history of smoking, which may influence choice of hormonal therapy due to increased risk of blood clots with tamoxifen. -Wait for Oncotype results before deciding on next steps. -Consider radiation therapy based on Oncotype results and consultation with radiation oncologist. -Consider anastrozole for hormonal therapy, given smoking history and potential for blood clots with tamoxifen.  Osteoporosis Patient has known osteoporosis, which may be exacerbated by anastrozole. Discussed need for treatment of osteoporosis and potential dental effects of osteoporosis medication. -Recommend dental visit prior to starting osteoporosis medication. -Consider starting osteoporosis medication if anastrozole is chosen for hormonal therapy.  Post-operative Seroma Noted post-operative seroma at  surgical site.  Cardiac Arrhythmia Noted slow and  erratic heart rate post-operatively. Patient was taken off metoprolol by cardiologist and is currently on a heart monitor. -Continue monitoring as per cardiologist's plan.  Smoking Patient continues to smoke. Discussed potential impact on choice of hormonal therapy and increased risk of blood clots with tamoxifen. -Recommend smoking cessation, consider referral for smoking cessation support.  Follow-up Plan to follow-up after Oncotype results are received and after potential radiation therapy.    All questions were answered. The patient knows to call the clinic with any problems, questions or concerns.    Rachel Moulds, MD 06/18/23

## 2023-06-18 NOTE — Assessment & Plan Note (Signed)
This is a very pleasant 78 year old female patient with newly diagnosed left breast grade 1 invasive ductal carcinoma, ER PR positive, HER2 0, Ki-67 of less than 1% referred to breast [facility] for additional recommendations.  Stage 1 Invasive Ductal Carcinoma (Left Breast)  Breast Cancer Post-operative status with complete tumor removal. Tumor size larger than anticipated (1.7 cm). Oncotype test sent, results pending. Discussed potential treatment options including radiation and hormonal therapy (anastrozole or tamoxifen). Patient has a history of smoking, which may influence choice of hormonal therapy due to increased risk of blood clots with tamoxifen. -Wait for Oncotype results before deciding on next steps. -Consider radiation therapy based on Oncotype results and consultation with radiation oncologist. -Consider anastrozole for hormonal therapy, given smoking history and potential for blood clots with tamoxifen.  Osteoporosis Patient has known osteoporosis, which may be exacerbated by anastrozole. Discussed need for treatment of osteoporosis and potential dental effects of osteoporosis medication. -Recommend dental visit prior to starting osteoporosis medication. -Consider starting osteoporosis medication if anastrozole is chosen for hormonal therapy.  Post-operative Seroma Noted post-operative seroma at surgical site.  Cardiac Arrhythmia Noted slow and erratic heart rate post-operatively. Patient was taken off metoprolol by cardiologist and is currently on a heart monitor. -Continue monitoring as per cardiologist's plan.  Smoking Patient continues to smoke. Discussed potential impact on choice of hormonal therapy and increased risk of blood clots with tamoxifen. -Recommend smoking cessation, consider referral for smoking cessation support.  Follow-up Plan to follow-up after Oncotype results are received and after potential radiation therapy.

## 2023-06-20 ENCOUNTER — Encounter (HOSPITAL_COMMUNITY): Payer: Self-pay

## 2023-06-21 ENCOUNTER — Ambulatory Visit: Payer: PPO | Admitting: Cardiology

## 2023-06-26 ENCOUNTER — Telehealth: Payer: Self-pay | Admitting: *Deleted

## 2023-06-26 ENCOUNTER — Encounter: Payer: Self-pay | Admitting: *Deleted

## 2023-06-26 ENCOUNTER — Encounter (HOSPITAL_BASED_OUTPATIENT_CLINIC_OR_DEPARTMENT_OTHER): Payer: Self-pay | Admitting: *Deleted

## 2023-06-26 DIAGNOSIS — Z170 Estrogen receptor positive status [ER+]: Secondary | ICD-10-CM

## 2023-06-26 NOTE — Telephone Encounter (Signed)
Received oncotype results of 7/3%. Referral placed for Dr. Roselind Messier.

## 2023-07-02 ENCOUNTER — Encounter: Payer: Self-pay | Admitting: *Deleted

## 2023-07-02 NOTE — Progress Notes (Signed)
Location of Breast Cancer:Left Breast  Histology per Pathology Report:    Receptor Status: ER(95%), PR (1%), Her2-neu (negative, 0 ), Ki-67(<1%)  Did patient present with symptoms (if so, please note symptoms) or was this found on screening mammography?:   Past/Anticipated interventions by surgeon, if any:  Past/Anticipated interventions by medical oncology, if any:   Follow-up Plan to follow-up after Oncotype results are received and after potential radiation therapy Rachel Moulds, MD 06/18/23 Lymphedema issues, if any: Denies   Pain issues, if any:  Yes, she reports tenderness in breast.  SAFETY ISSUES: Prior radiation? No Pacemaker/ICD? No, but reports a stent in her heart Possible current pregnancy?No Is the patient on methotrexate? No  Current Complaints / other details: She would like to get a prescription for Chantix.   BP (!) 162/61 (BP Location: Left Arm, Patient Position: Sitting)   Pulse (!) 54   Temp 97.7 F (36.5 C) (Temporal)   Resp 18   Ht 5\' 2"  (1.575 m)   Wt 132 lb 8 oz (60.1 kg)   SpO2 99%   BMI 24.23 kg/m

## 2023-07-03 ENCOUNTER — Telehealth: Payer: Self-pay | Admitting: Cardiology

## 2023-07-03 ENCOUNTER — Encounter (HOSPITAL_BASED_OUTPATIENT_CLINIC_OR_DEPARTMENT_OTHER): Payer: Self-pay | Admitting: Cardiology

## 2023-07-03 NOTE — Progress Notes (Signed)
Radiation Oncology         [phone] ________________________________  Name: Casey Kirk MRN: [id number]  Date: 07/04/2023  DOB: 1946-03-23  Re-Evaluation Note  CC: Daisy Floro, MD  Rachel Moulds, MD  No diagnosis found.  Diagnosis: Stage IA Malignant neoplasm of upper-outer quadrant of left breast in female, estrogen receptor positive (HCC)  (cT1a, cN0, cM0, G1, ER+, PR+, HER2-) Grade 1  Narrative:  The patient returns today to discuss radiation treatment options. She was last seen in the multidisciplinary breast clinic on 05-09-23 for her initial consultation.   She opted to proceed with a left breast lumpectomy, without nodal biopsy on 06-08-23 under the care of Dr. Dwain Sarna. Pathology from the procedure revealed: lobular carcinoma in situ. All margins are negative for invasive disease. Tumor size being 1.7 cm in the greatest extent. Prognostic indicators significant for: estrogen receptor 95% positive with strong staining intensity; progesterone receptor 100% positive with strong staining intensity; Proliferation marker Ki67 at less than 1%; Her2 status 0; Grade 1.   Oncotype DX was obtained on the final surgical sample and the recurrence score of 7 predicts a risk of recurrence outside the breast over the next 9 years of 3%, if the patient's only systemic therapy is an antiestrogen for 5 years.  It also predicts no significant benefit from chemotherapy.  Patient did experience cardiac irregularities, described as a slow and erratic heart rate following her procedure. Condition is currently being followed by Dr. Donato Schultz. She was placed on a heart monitor for two weeks, results showing atrial fibrillation with occasional PACs. Per cardiologist recommendation, she is to start Eliquis 5 mg twice a day for anticoagulation and stroke prevention.  During her post-op follow up with Dr. Dwain Sarna on 07-03-23, she reports a fair amount of breast pain following her surgeries and  thus pain management options were recommended to her. Otherwise, she was recovering quite well.    On review of systems, the patient reports ***. She denies *** and any other symptoms.    Allergies:  is allergic to hydrocodone-acetaminophen, latex, and pneumococcal polysaccharide vaccine.  Meds: Current Outpatient Medications  Medication Sig Dispense Refill   acetaminophen (TYLENOL) 500 MG tablet Take 500 mg by mouth every 8 (eight) hours as needed (pain).     albuterol (PROVENTIL HFA;VENTOLIN HFA) 108 (90 Base) MCG/ACT inhaler Inhale 2 puffs into the lungs every 6 (six) hours as needed for wheezing or shortness of breath. 1 Inhaler 5   Ascorbic Acid (VITAMIN C PO) Take 1 tablet by mouth daily.     aspirin EC 81 MG tablet Take 81 mg by mouth daily.     Calcium Citrate-Vitamin D (CALCIUM + D PO) Take 1 tablet by mouth daily.     diphenhydrAMINE (BENADRYL) 25 mg capsule Take 25 mg by mouth daily as needed for allergies.     fluticasone (FLONASE) 50 MCG/ACT nasal spray Place 2 sprays into both nostrils daily as needed for allergies or rhinitis.     GARLIC PO Take 1 tablet by mouth 2 (two) times a week.     guaiFENesin (MUCINEX) 600 MG 12 hr tablet Take 600 mg by mouth as needed for cough or to loosen phlegm.     loperamide (IMODIUM A-D) 2 MG tablet Take 2 mg by mouth 4 (four) times daily as needed for diarrhea or loose stools.     melatonin 5 MG TABS Take 5 mg by mouth at bedtime.     nicotine polacrilex (NICORETTE) 2  MG gum Take 2 mg by mouth as needed for smoking cessation.     nitroGLYCERIN (NITROSTAT) 0.4 MG SL tablet Place 0.4 mg under the tongue every 5 (five) minutes as needed for chest pain.     Omega-3 Fatty Acids (FISH OIL PO) Take 1 capsule by mouth 3 (three) times a week.     rosuvastatin (CRESTOR) 20 MG tablet Take 20 mg by mouth daily.      tiZANidine (ZANAFLEX) 2 MG tablet Take 2 mg by mouth 3 (three) times daily as needed for muscle spasms.     traZODone (DESYREL) 50 MG tablet  Take 0.5-1 tablets (25-50 mg total) by mouth at bedtime as needed. (Patient taking differently: Take 25-50 mg by mouth at bedtime.) 90 tablet 3   TURMERIC PO Take 1 tablet by mouth daily.     VITAMIN A PO Take 1 capsule by mouth daily.     VITAMIN E PO Take 1 capsule by mouth daily.     No current facility-administered medications for this encounter.    Physical Findings: The patient is in no acute distress. Patient is alert and oriented.  vitals were not taken for this visit.  No significant changes. Lungs are clear to auscultation bilaterally. Heart has regular rate and rhythm. No palpable cervical, supraclavicular, or axillary adenopathy. Abdomen soft, non-tender, normal bowel sounds. *** Breast: no palpable mass, nipple discharge or bleeding. *** Breast: ***  Lab Findings: Lab Results  Component Value Date   WBC 9.2 05/09/2023   HGB 15.5 (H) 05/09/2023   HCT 46.0 05/09/2023   MCV 87.3 05/09/2023   PLT 196 05/09/2023    Radiographic Findings: LONG TERM MONITOR (3-14 DAYS) Result Date: 07/03/2023   Atrial fibrillation - avg 129 bpm.  Less than 1% burden longest episode lasted about 19 minutes   Longest pause was 5.2 seconds between 3 and 4 AM likely during sleep.   Otherwise sinus rhythm with average heart rate of 61 bpm   Occasional PACs, rare PVCs   Recommend starting Eliquis 5 mg twice a day for anticoagulation, stroke prevention in the setting of paroxysmal atrial fibrillation   Recommend setting up for sleep study given pauses during sleep.  This may be indicative of sleep apnea   Since pauses are occurring during sleep, we will try to avoid pacemaker at this time.  Obviously if symptomatic pauses occur during the day, pacemaker may be warranted.  Her metoprolol was stopped at most recent clinic visit. Patch Wear Time:  13 days and 23 hours (2025-01-20T11:21:57-0500 to 2025-02-03T11:21:49-0500) Patient had a min HR of 31 bpm, max HR of 167 bpm, and avg HR of 61 bpm. Predominant  underlying rhythm was Sinus Rhythm. Atrial Fibrillation occurred (<1% burden), ranging from 86-167 bpm (avg of 129 bpm), the longest lasting 18 mins 53 secs with an avg rate of 133 bpm. 609 Pauses occurred, the longest lasting 5.2 secs (12 bpm). Isolated SVEs were occasional (2.6%, 19984), SVE Couplets were rare (<1.0%, 1539), and SVE Triplets were rare (<1.0%, 134). Isolated VEs were rare (<1.0%), and no VE Couplets or  VE Triplets were present. MD notification criteria for Pauses met - report posted prior to notification per account request (PR).    Impression:  Stage IA Malignant neoplasm of upper-outer quadrant of left breast in female, estrogen receptor positive (HCC)  (cT1a, cN0, cM0, G1, ER+, PR+, HER2-)  ***  Plan:  Patient is scheduled for CT simulation {date/later today}. ***  -----------------------------------  Billie Lade, PhD,  MD   This document serves as a record of services personally performed by Antony Blackbird, MD. It was created on his behalf by Herbie Saxon, a trained medical scribe. The creation of this record is based on the scribe's personal observations and the provider's statements to them. This document has been checked and approved by the attending provider.

## 2023-07-03 NOTE — Telephone Encounter (Signed)
    Atrial fibrillation - avg 129 bpm.  Less than 1% burden longest episode lasted about 19 minutes   Longest pause was 5.2 seconds between 3 and 4 AM likely during sleep.   Otherwise sinus rhythm with average heart rate of 61 bpm   Occasional PACs, rare PVCs   Recommend starting Eliquis 5 mg twice a day for anticoagulation, stroke prevention in the setting of paroxysmal atrial fibrillation   Recommend setting up for sleep study given pauses during sleep.  This may be indicative of sleep apnea   Since pauses are occurring during sleep, we will try to avoid pacemaker at this time.  Obviously if symptomatic pauses occur during the day, pacemaker may be warranted.  Her metoprolol was stopped at most recent clinic visit.  Written by Jake Bathe, MD on 07/03/2023  4:36 PM EST

## 2023-07-03 NOTE — Telephone Encounter (Signed)
[facility] by Meredeth Ide is calling to report abnormal readings.  Ref # [id number]

## 2023-07-03 NOTE — Telephone Encounter (Addendum)
Olivia calling from [facility] with end of summary report. She states back to back to pauses totaling 6.5 seconds  You can find on page 13 and 14 on strip 6.  Tried to reach patient left voicemail to return call to office

## 2023-07-04 ENCOUNTER — Encounter: Payer: Self-pay | Admitting: Radiation Oncology

## 2023-07-04 ENCOUNTER — Other Ambulatory Visit: Payer: Self-pay | Admitting: Radiation Oncology

## 2023-07-04 ENCOUNTER — Ambulatory Visit
Admission: RE | Admit: 2023-07-04 | Discharge: 2023-07-04 | Disposition: A | Discharge status: Home / Self Care | Payer: PPO | Source: Ambulatory Visit | Attending: Radiation Oncology | Admitting: Radiation Oncology

## 2023-07-04 ENCOUNTER — Ambulatory Visit
Admission: RE | Admit: 2023-07-04 | Discharge: 2023-07-04 | Disposition: A | Discharge status: Home / Self Care | Payer: Self-pay | Source: Ambulatory Visit | Attending: Radiation Oncology | Admitting: Radiation Oncology

## 2023-07-04 VITALS — BP 162/61 | HR 54 | Temp 97.7°F | Resp 18 | Ht 62.0 in | Wt 132.5 lb

## 2023-07-04 DIAGNOSIS — Z170 Estrogen receptor positive status [ER+]: Secondary | ICD-10-CM

## 2023-07-04 DIAGNOSIS — Z79899 Other long term (current) drug therapy: Secondary | ICD-10-CM | POA: Diagnosis not present

## 2023-07-04 DIAGNOSIS — Z510 Encounter for antineoplastic radiation therapy: Secondary | ICD-10-CM | POA: Diagnosis not present

## 2023-07-04 DIAGNOSIS — Z7901 Long term (current) use of anticoagulants: Secondary | ICD-10-CM | POA: Insufficient documentation

## 2023-07-04 DIAGNOSIS — Z923 Personal history of irradiation: Secondary | ICD-10-CM | POA: Diagnosis not present

## 2023-07-04 DIAGNOSIS — C50412 Malignant neoplasm of upper-outer quadrant of left female breast: Secondary | ICD-10-CM | POA: Diagnosis not present

## 2023-07-04 MED ORDER — APIXABAN 5 MG PO TABS: 5.0000 mg | ORAL_TABLET | Freq: Two times a day (BID) | ORAL | 6 refills | Status: DC

## 2023-07-04 NOTE — Telephone Encounter (Signed)
Patient is returning call. Requesting return call.

## 2023-07-04 NOTE — Telephone Encounter (Signed)
Reviewed results of cardiac monitor with pt who states understanding.  She requests RX be sent into [facility] [address]  Aware of pauses during sleep but would prefer holding off on sleep study at this time d/t having a new DX of breast cancer and starting radiation.  She will notify us when she feels able to handle more testing.  Will send information regarding At Fib to pt via MyChart.  She is aware to call back if ay further questions or concerns.

## 2023-07-04 NOTE — Addendum Note (Signed)
Encounter addended by: Antony Blackbird, MD on: 07/04/2023 7:15 PM  Actions taken: Follow-up modified, Level of Service modified

## 2023-07-04 NOTE — Telephone Encounter (Signed)
Spoke with pt who is asking if ok to take Eliquis with rosuvastatin.  Advised it is OK to take.  Pt is also asking if she can have a prescription for generic Chantix.  Advised I will review with Dr Anne Fu to see if he will prescribe and call her back once I have an answer.

## 2023-07-05 NOTE — Telephone Encounter (Signed)
Please advise

## 2023-07-09 MED ORDER — VARENICLINE TARTRATE (STARTER) 0.5 MG X 11 & 1 MG X 42 PO TBPK: 0.5000 mg | ORAL_TABLET | ORAL | 0 refills | Status: AC

## 2023-07-09 MED ORDER — VARENICLINE TARTRATE 1 MG PO TABS: 1.0000 mg | ORAL_TABLET | Freq: Two times a day (BID) | ORAL | 5 refills | Status: AC

## 2023-07-09 NOTE — Addendum Note (Signed)
Addended by: Sharin Grave on: 07/09/2023 08:18 AM   Modules accepted: Orders

## 2023-07-10 ENCOUNTER — Encounter: Payer: Self-pay | Admitting: *Deleted

## 2023-07-10 DIAGNOSIS — Z170 Estrogen receptor positive status [ER+]: Secondary | ICD-10-CM

## 2023-07-10 DIAGNOSIS — C50412 Malignant neoplasm of upper-outer quadrant of left female breast: Secondary | ICD-10-CM

## 2023-07-11 DIAGNOSIS — Z170 Estrogen receptor positive status [ER+]: Secondary | ICD-10-CM | POA: Diagnosis not present

## 2023-07-11 DIAGNOSIS — Z510 Encounter for antineoplastic radiation therapy: Secondary | ICD-10-CM | POA: Diagnosis not present

## 2023-07-11 DIAGNOSIS — C50412 Malignant neoplasm of upper-outer quadrant of left female breast: Secondary | ICD-10-CM | POA: Diagnosis not present

## 2023-07-11 NOTE — Telephone Encounter (Signed)
RX was sent into pharmacy of choice 07/09/23 as ordered.

## 2023-07-16 ENCOUNTER — Ambulatory Visit
Admission: RE | Admit: 2023-07-16 | Discharge: 2023-07-16 | Disposition: A | Discharge status: Home / Self Care | Payer: PPO | Source: Ambulatory Visit | Attending: Radiation Oncology | Admitting: Radiation Oncology

## 2023-07-16 ENCOUNTER — Telehealth: Payer: Self-pay | Admitting: Radiation Oncology

## 2023-07-16 ENCOUNTER — Other Ambulatory Visit: Payer: Self-pay

## 2023-07-16 DIAGNOSIS — Z170 Estrogen receptor positive status [ER+]: Secondary | ICD-10-CM

## 2023-07-16 DIAGNOSIS — Z510 Encounter for antineoplastic radiation therapy: Secondary | ICD-10-CM | POA: Diagnosis not present

## 2023-07-16 DIAGNOSIS — C50412 Malignant neoplasm of upper-outer quadrant of left female breast: Secondary | ICD-10-CM | POA: Diagnosis not present

## 2023-07-16 LAB — RAD ONC ARIA SESSION SUMMARY
Course Elapsed Days: 0
Plan Fractions Treated to Date: 1
Plan Prescribed Dose Per Fraction: 2.67 Gy
Plan Total Fractions Prescribed: 15
Plan Total Prescribed Dose: 40.05 Gy
Reference Point Dosage Given to Date: 2.67 Gy
Reference Point Session Dosage Given: 2.67 Gy
Session Number: 1

## 2023-07-16 NOTE — Telephone Encounter (Signed)
 2/24 @ 4:22 pm Received call from [employer] [phone]) requested information over the phone for patient treatment planning summary.  Ask to send requested for medical record to our fax number and will send once receive.

## 2023-07-17 ENCOUNTER — Other Ambulatory Visit: Payer: Self-pay

## 2023-07-17 ENCOUNTER — Ambulatory Visit
Admission: RE | Admit: 2023-07-17 | Discharge: 2023-07-17 | Disposition: A | Discharge status: Home / Self Care | Payer: PPO | Source: Ambulatory Visit | Attending: Radiation Oncology | Admitting: Radiation Oncology

## 2023-07-17 DIAGNOSIS — Z170 Estrogen receptor positive status [ER+]: Secondary | ICD-10-CM | POA: Diagnosis not present

## 2023-07-17 DIAGNOSIS — Z510 Encounter for antineoplastic radiation therapy: Secondary | ICD-10-CM | POA: Diagnosis not present

## 2023-07-17 DIAGNOSIS — C50412 Malignant neoplasm of upper-outer quadrant of left female breast: Secondary | ICD-10-CM

## 2023-07-17 LAB — RAD ONC ARIA SESSION SUMMARY
Course Elapsed Days: 1
Plan Fractions Treated to Date: 2
Plan Prescribed Dose Per Fraction: 2.67 Gy
Plan Total Fractions Prescribed: 15
Plan Total Prescribed Dose: 40.05 Gy
Reference Point Dosage Given to Date: 5.34 Gy
Reference Point Session Dosage Given: 2.67 Gy
Session Number: 2

## 2023-07-17 MED ORDER — ALRA NON-METALLIC DEODORANT (RAD-ONC)
1.0000 | Freq: Once | TOPICAL | Status: AC
  Administered 2023-07-17: 1 via TOPICAL

## 2023-07-17 MED ORDER — RADIAPLEXRX EX GEL
Freq: Once | CUTANEOUS | Status: AC
Start: 2023-07-17 — End: 2023-07-17

## 2023-07-18 ENCOUNTER — Other Ambulatory Visit: Payer: Self-pay

## 2023-07-18 ENCOUNTER — Ambulatory Visit
Admission: RE | Admit: 2023-07-18 | Discharge: 2023-07-18 | Disposition: A | Discharge status: Home / Self Care | Payer: PPO | Source: Ambulatory Visit | Attending: Radiation Oncology | Admitting: Radiation Oncology

## 2023-07-18 DIAGNOSIS — Z170 Estrogen receptor positive status [ER+]: Secondary | ICD-10-CM | POA: Diagnosis not present

## 2023-07-18 DIAGNOSIS — C50412 Malignant neoplasm of upper-outer quadrant of left female breast: Secondary | ICD-10-CM | POA: Diagnosis not present

## 2023-07-18 DIAGNOSIS — Z510 Encounter for antineoplastic radiation therapy: Secondary | ICD-10-CM | POA: Diagnosis not present

## 2023-07-18 LAB — RAD ONC ARIA SESSION SUMMARY
Course Elapsed Days: 2
Plan Fractions Treated to Date: 3
Plan Prescribed Dose Per Fraction: 2.67 Gy
Plan Total Fractions Prescribed: 15
Plan Total Prescribed Dose: 40.05 Gy
Reference Point Dosage Given to Date: 8.01 Gy
Reference Point Session Dosage Given: 2.67 Gy
Session Number: 3

## 2023-07-19 ENCOUNTER — Ambulatory Visit
Admission: RE | Admit: 2023-07-19 | Discharge: 2023-07-19 | Disposition: A | Discharge status: Home / Self Care | Payer: PPO | Source: Ambulatory Visit | Attending: Radiation Oncology | Admitting: Radiation Oncology

## 2023-07-19 ENCOUNTER — Other Ambulatory Visit: Payer: Self-pay

## 2023-07-19 ENCOUNTER — Telehealth: Payer: Self-pay | Admitting: Radiation Oncology

## 2023-07-19 DIAGNOSIS — C50412 Malignant neoplasm of upper-outer quadrant of left female breast: Secondary | ICD-10-CM | POA: Diagnosis not present

## 2023-07-19 DIAGNOSIS — Z170 Estrogen receptor positive status [ER+]: Secondary | ICD-10-CM | POA: Diagnosis not present

## 2023-07-19 DIAGNOSIS — Z510 Encounter for antineoplastic radiation therapy: Secondary | ICD-10-CM | POA: Diagnosis not present

## 2023-07-19 LAB — RAD ONC ARIA SESSION SUMMARY
Course Elapsed Days: 3
Plan Fractions Treated to Date: 4
Plan Prescribed Dose Per Fraction: 2.67 Gy
Plan Total Fractions Prescribed: 15
Plan Total Prescribed Dose: 40.05 Gy
Reference Point Dosage Given to Date: 10.68 Gy
Reference Point Session Dosage Given: 2.67 Gy
Session Number: 4

## 2023-07-19 NOTE — Telephone Encounter (Signed)
 Pt called to speak with me. She stated she received a call earlier today from someone in the [facility] system advising [employer] was advised of her 15 tx. She is frustrated because she is trying to make sure they also include the additional 6 boost treatments. Pt is fully aware that [employer] is her primary insurance but is wanting to make sure she received the compensation needed from her personal cancer policy under [employer]. Whoever she spoke with seemed to think she was confused about who her primary insurance was, per pt. Pt also expressed her frustration with the therapists who spoke with her while she was in for her treatments today. She said they were abrasive as they tried to explain her treatment schedule. She did her best to advise them that she was not confused about her upcoming appts. She emphasized "I have a speech impediment, I'm not stupid. I keep meticulous record of my visits and review all my medical bills."  I have escalated this to my direct supervisor Dimas Chyle for review.

## 2023-07-20 ENCOUNTER — Ambulatory Visit
Admission: RE | Admit: 2023-07-20 | Discharge: 2023-07-20 | Disposition: A | Discharge status: Home / Self Care | Payer: PPO | Source: Ambulatory Visit | Attending: Radiation Oncology | Admitting: Radiation Oncology

## 2023-07-20 ENCOUNTER — Other Ambulatory Visit: Payer: Self-pay

## 2023-07-20 DIAGNOSIS — Z170 Estrogen receptor positive status [ER+]: Secondary | ICD-10-CM | POA: Diagnosis not present

## 2023-07-20 DIAGNOSIS — C50412 Malignant neoplasm of upper-outer quadrant of left female breast: Secondary | ICD-10-CM | POA: Diagnosis not present

## 2023-07-20 DIAGNOSIS — Z510 Encounter for antineoplastic radiation therapy: Secondary | ICD-10-CM | POA: Diagnosis not present

## 2023-07-20 LAB — RAD ONC ARIA SESSION SUMMARY
Course Elapsed Days: 4
Plan Fractions Treated to Date: 5
Plan Prescribed Dose Per Fraction: 2.67 Gy
Plan Total Fractions Prescribed: 15
Plan Total Prescribed Dose: 40.05 Gy
Reference Point Dosage Given to Date: 13.35 Gy
Reference Point Session Dosage Given: 2.67 Gy
Session Number: 5

## 2023-07-23 ENCOUNTER — Other Ambulatory Visit: Payer: Self-pay

## 2023-07-23 ENCOUNTER — Ambulatory Visit
Admission: RE | Admit: 2023-07-23 | Discharge: 2023-07-23 | Disposition: A | Discharge status: Home / Self Care | Payer: PPO | Source: Ambulatory Visit | Attending: Radiation Oncology | Admitting: Radiation Oncology

## 2023-07-23 DIAGNOSIS — C50412 Malignant neoplasm of upper-outer quadrant of left female breast: Secondary | ICD-10-CM | POA: Diagnosis not present

## 2023-07-23 DIAGNOSIS — Z170 Estrogen receptor positive status [ER+]: Secondary | ICD-10-CM | POA: Insufficient documentation

## 2023-07-23 DIAGNOSIS — Z510 Encounter for antineoplastic radiation therapy: Secondary | ICD-10-CM | POA: Diagnosis not present

## 2023-07-23 LAB — RAD ONC ARIA SESSION SUMMARY
Course Elapsed Days: 7
Plan Fractions Treated to Date: 6
Plan Prescribed Dose Per Fraction: 2.67 Gy
Plan Total Fractions Prescribed: 15
Plan Total Prescribed Dose: 40.05 Gy
Reference Point Dosage Given to Date: 16.02 Gy
Reference Point Session Dosage Given: 2.67 Gy
Session Number: 6

## 2023-07-24 ENCOUNTER — Other Ambulatory Visit: Payer: Self-pay

## 2023-07-24 ENCOUNTER — Ambulatory Visit
Admission: RE | Admit: 2023-07-24 | Discharge: 2023-07-24 | Disposition: A | Discharge status: Home / Self Care | Payer: PPO | Source: Ambulatory Visit | Attending: Radiation Oncology | Admitting: Radiation Oncology

## 2023-07-24 ENCOUNTER — Ambulatory Visit: Payer: PPO

## 2023-07-24 DIAGNOSIS — C50412 Malignant neoplasm of upper-outer quadrant of left female breast: Secondary | ICD-10-CM | POA: Diagnosis not present

## 2023-07-24 DIAGNOSIS — Z170 Estrogen receptor positive status [ER+]: Secondary | ICD-10-CM | POA: Diagnosis not present

## 2023-07-24 DIAGNOSIS — Z510 Encounter for antineoplastic radiation therapy: Secondary | ICD-10-CM | POA: Diagnosis not present

## 2023-07-24 LAB — RAD ONC ARIA SESSION SUMMARY
Course Elapsed Days: 8
Plan Fractions Treated to Date: 7
Plan Prescribed Dose Per Fraction: 2.67 Gy
Plan Total Fractions Prescribed: 15
Plan Total Prescribed Dose: 40.05 Gy
Reference Point Dosage Given to Date: 18.69 Gy
Reference Point Session Dosage Given: 2.67 Gy
Session Number: 7

## 2023-07-25 ENCOUNTER — Ambulatory Visit
Admission: RE | Admit: 2023-07-25 | Discharge: 2023-07-25 | Disposition: A | Discharge status: Home / Self Care | Payer: PPO | Source: Ambulatory Visit | Attending: Radiation Oncology | Admitting: Radiation Oncology

## 2023-07-25 ENCOUNTER — Other Ambulatory Visit: Payer: Self-pay

## 2023-07-25 DIAGNOSIS — C50412 Malignant neoplasm of upper-outer quadrant of left female breast: Secondary | ICD-10-CM | POA: Diagnosis not present

## 2023-07-25 DIAGNOSIS — Z170 Estrogen receptor positive status [ER+]: Secondary | ICD-10-CM | POA: Diagnosis not present

## 2023-07-25 DIAGNOSIS — Z510 Encounter for antineoplastic radiation therapy: Secondary | ICD-10-CM | POA: Diagnosis not present

## 2023-07-25 LAB — RAD ONC ARIA SESSION SUMMARY
Course Elapsed Days: 9
Plan Fractions Treated to Date: 8
Plan Prescribed Dose Per Fraction: 2.67 Gy
Plan Total Fractions Prescribed: 15
Plan Total Prescribed Dose: 40.05 Gy
Reference Point Dosage Given to Date: 21.36 Gy
Reference Point Session Dosage Given: 2.67 Gy
Session Number: 8

## 2023-07-26 ENCOUNTER — Ambulatory Visit
Admission: RE | Admit: 2023-07-26 | Discharge: 2023-07-26 | Disposition: A | Discharge status: Home / Self Care | Payer: PPO | Source: Ambulatory Visit | Attending: Radiation Oncology | Admitting: Radiation Oncology

## 2023-07-26 ENCOUNTER — Other Ambulatory Visit: Payer: Self-pay

## 2023-07-26 DIAGNOSIS — Z170 Estrogen receptor positive status [ER+]: Secondary | ICD-10-CM | POA: Diagnosis not present

## 2023-07-26 DIAGNOSIS — Z510 Encounter for antineoplastic radiation therapy: Secondary | ICD-10-CM | POA: Diagnosis not present

## 2023-07-26 DIAGNOSIS — C50412 Malignant neoplasm of upper-outer quadrant of left female breast: Secondary | ICD-10-CM | POA: Diagnosis not present

## 2023-07-26 LAB — RAD ONC ARIA SESSION SUMMARY
Course Elapsed Days: 10
Plan Fractions Treated to Date: 9
Plan Prescribed Dose Per Fraction: 2.67 Gy
Plan Total Fractions Prescribed: 15
Plan Total Prescribed Dose: 40.05 Gy
Reference Point Dosage Given to Date: 24.03 Gy
Reference Point Session Dosage Given: 2.67 Gy
Session Number: 9

## 2023-07-27 ENCOUNTER — Ambulatory Visit: Admission: RE | Admit: 2023-07-27 | Payer: PPO | Source: Ambulatory Visit

## 2023-07-27 DIAGNOSIS — C50412 Malignant neoplasm of upper-outer quadrant of left female breast: Secondary | ICD-10-CM | POA: Diagnosis not present

## 2023-07-27 DIAGNOSIS — Z170 Estrogen receptor positive status [ER+]: Secondary | ICD-10-CM | POA: Diagnosis not present

## 2023-07-27 DIAGNOSIS — Z510 Encounter for antineoplastic radiation therapy: Secondary | ICD-10-CM | POA: Diagnosis not present

## 2023-07-30 ENCOUNTER — Ambulatory Visit
Admission: RE | Admit: 2023-07-30 | Discharge: 2023-07-30 | Disposition: A | Discharge status: Home / Self Care | Payer: PPO | Source: Ambulatory Visit | Attending: Radiation Oncology | Admitting: Radiation Oncology

## 2023-07-30 ENCOUNTER — Other Ambulatory Visit: Payer: Self-pay

## 2023-07-30 DIAGNOSIS — Z170 Estrogen receptor positive status [ER+]: Secondary | ICD-10-CM | POA: Diagnosis not present

## 2023-07-30 DIAGNOSIS — C50412 Malignant neoplasm of upper-outer quadrant of left female breast: Secondary | ICD-10-CM

## 2023-07-30 DIAGNOSIS — Z510 Encounter for antineoplastic radiation therapy: Secondary | ICD-10-CM | POA: Diagnosis not present

## 2023-07-30 LAB — RAD ONC ARIA SESSION SUMMARY
Course Elapsed Days: 14
Plan Fractions Treated to Date: 10
Plan Prescribed Dose Per Fraction: 2.67 Gy
Plan Total Fractions Prescribed: 15
Plan Total Prescribed Dose: 40.05 Gy
Reference Point Dosage Given to Date: 26.7 Gy
Reference Point Session Dosage Given: 2.67 Gy
Session Number: 10

## 2023-07-31 ENCOUNTER — Ambulatory Visit
Admission: RE | Admit: 2023-07-31 | Discharge: 2023-07-31 | Disposition: A | Discharge status: Home / Self Care | Payer: PPO | Source: Ambulatory Visit | Attending: Radiation Oncology | Admitting: Radiation Oncology

## 2023-07-31 ENCOUNTER — Ambulatory Visit

## 2023-07-31 ENCOUNTER — Other Ambulatory Visit: Payer: Self-pay

## 2023-07-31 DIAGNOSIS — Z170 Estrogen receptor positive status [ER+]: Secondary | ICD-10-CM | POA: Diagnosis not present

## 2023-07-31 DIAGNOSIS — Z510 Encounter for antineoplastic radiation therapy: Secondary | ICD-10-CM | POA: Diagnosis not present

## 2023-07-31 DIAGNOSIS — C50412 Malignant neoplasm of upper-outer quadrant of left female breast: Secondary | ICD-10-CM

## 2023-07-31 LAB — RAD ONC ARIA SESSION SUMMARY
Course Elapsed Days: 15
Plan Fractions Treated to Date: 11
Plan Prescribed Dose Per Fraction: 2.67 Gy
Plan Total Fractions Prescribed: 15
Plan Total Prescribed Dose: 40.05 Gy
Reference Point Dosage Given to Date: 29.37 Gy
Reference Point Session Dosage Given: 2.67 Gy
Session Number: 11

## 2023-07-31 MED ORDER — RADIAPLEXRX EX GEL: Freq: Once | CUTANEOUS | Status: AC

## 2023-08-01 ENCOUNTER — Ambulatory Visit
Admission: RE | Admit: 2023-08-01 | Discharge: 2023-08-01 | Disposition: A | Discharge status: Home / Self Care | Payer: PPO | Source: Ambulatory Visit | Attending: Radiation Oncology | Admitting: Radiation Oncology

## 2023-08-01 ENCOUNTER — Other Ambulatory Visit: Payer: Self-pay

## 2023-08-01 DIAGNOSIS — Z170 Estrogen receptor positive status [ER+]: Secondary | ICD-10-CM | POA: Diagnosis not present

## 2023-08-01 DIAGNOSIS — C50412 Malignant neoplasm of upper-outer quadrant of left female breast: Secondary | ICD-10-CM | POA: Diagnosis not present

## 2023-08-01 DIAGNOSIS — Z510 Encounter for antineoplastic radiation therapy: Secondary | ICD-10-CM | POA: Diagnosis not present

## 2023-08-01 LAB — RAD ONC ARIA SESSION SUMMARY
Course Elapsed Days: 16
Plan Fractions Treated to Date: 12
Plan Prescribed Dose Per Fraction: 2.67 Gy
Plan Total Fractions Prescribed: 15
Plan Total Prescribed Dose: 40.05 Gy
Reference Point Dosage Given to Date: 32.04 Gy
Reference Point Session Dosage Given: 2.67 Gy
Session Number: 12

## 2023-08-02 ENCOUNTER — Other Ambulatory Visit: Payer: Self-pay

## 2023-08-02 ENCOUNTER — Ambulatory Visit
Admission: RE | Admit: 2023-08-02 | Discharge: 2023-08-02 | Disposition: A | Discharge status: Home / Self Care | Payer: PPO | Source: Ambulatory Visit | Attending: Radiation Oncology | Admitting: Radiation Oncology

## 2023-08-02 DIAGNOSIS — Z170 Estrogen receptor positive status [ER+]: Secondary | ICD-10-CM | POA: Diagnosis not present

## 2023-08-02 DIAGNOSIS — C50412 Malignant neoplasm of upper-outer quadrant of left female breast: Secondary | ICD-10-CM | POA: Diagnosis not present

## 2023-08-02 DIAGNOSIS — Z510 Encounter for antineoplastic radiation therapy: Secondary | ICD-10-CM | POA: Diagnosis not present

## 2023-08-02 LAB — RAD ONC ARIA SESSION SUMMARY
Course Elapsed Days: 17
Plan Fractions Treated to Date: 13
Plan Prescribed Dose Per Fraction: 2.67 Gy
Plan Total Fractions Prescribed: 15
Plan Total Prescribed Dose: 40.05 Gy
Reference Point Dosage Given to Date: 34.71 Gy
Reference Point Session Dosage Given: 2.67 Gy
Session Number: 13

## 2023-08-03 ENCOUNTER — Ambulatory Visit
Admission: RE | Admit: 2023-08-03 | Discharge: 2023-08-03 | Disposition: A | Discharge status: Home / Self Care | Payer: PPO | Source: Ambulatory Visit | Attending: Radiation Oncology | Admitting: Radiation Oncology

## 2023-08-03 ENCOUNTER — Other Ambulatory Visit: Payer: Self-pay

## 2023-08-03 DIAGNOSIS — Z510 Encounter for antineoplastic radiation therapy: Secondary | ICD-10-CM | POA: Diagnosis not present

## 2023-08-03 LAB — RAD ONC ARIA SESSION SUMMARY
Course Elapsed Days: 18
Plan Fractions Treated to Date: 14
Plan Prescribed Dose Per Fraction: 2.67 Gy
Plan Total Fractions Prescribed: 15
Plan Total Prescribed Dose: 40.05 Gy
Reference Point Dosage Given to Date: 37.38 Gy
Reference Point Session Dosage Given: 2.67 Gy
Session Number: 14

## 2023-08-06 ENCOUNTER — Ambulatory Visit
Admission: RE | Admit: 2023-08-06 | Discharge: 2023-08-06 | Disposition: A | Discharge status: Home / Self Care | Payer: PPO | Source: Ambulatory Visit | Attending: Radiation Oncology | Admitting: Radiation Oncology

## 2023-08-06 ENCOUNTER — Other Ambulatory Visit: Payer: Self-pay

## 2023-08-06 DIAGNOSIS — C50412 Malignant neoplasm of upper-outer quadrant of left female breast: Secondary | ICD-10-CM | POA: Diagnosis not present

## 2023-08-06 DIAGNOSIS — Z510 Encounter for antineoplastic radiation therapy: Secondary | ICD-10-CM | POA: Diagnosis not present

## 2023-08-06 DIAGNOSIS — Z170 Estrogen receptor positive status [ER+]: Secondary | ICD-10-CM | POA: Diagnosis not present

## 2023-08-06 LAB — RAD ONC ARIA SESSION SUMMARY
Course Elapsed Days: 21
Plan Fractions Treated to Date: 15
Plan Prescribed Dose Per Fraction: 2.67 Gy
Plan Total Fractions Prescribed: 15
Plan Total Prescribed Dose: 40.05 Gy
Reference Point Dosage Given to Date: 40.05 Gy
Reference Point Session Dosage Given: 2.67 Gy
Session Number: 15

## 2023-08-07 ENCOUNTER — Ambulatory Visit
Admission: RE | Admit: 2023-08-07 | Discharge: 2023-08-07 | Disposition: A | Discharge status: Home / Self Care | Payer: PPO | Source: Ambulatory Visit | Attending: Radiation Oncology | Admitting: Radiation Oncology

## 2023-08-07 ENCOUNTER — Ambulatory Visit
Admission: RE | Admit: 2023-08-07 | Discharge: 2023-08-07 | Disposition: A | Discharge status: Home / Self Care | Source: Ambulatory Visit | Attending: Radiation Oncology | Admitting: Radiation Oncology

## 2023-08-07 ENCOUNTER — Other Ambulatory Visit: Payer: Self-pay

## 2023-08-07 DIAGNOSIS — Z510 Encounter for antineoplastic radiation therapy: Secondary | ICD-10-CM | POA: Diagnosis not present

## 2023-08-07 LAB — RAD ONC ARIA SESSION SUMMARY
Course Elapsed Days: 22
Plan Fractions Treated to Date: 1
Plan Prescribed Dose Per Fraction: 2 Gy
Plan Total Fractions Prescribed: 6
Plan Total Prescribed Dose: 12 Gy
Reference Point Dosage Given to Date: 2 Gy
Reference Point Session Dosage Given: 2 Gy
Session Number: 16

## 2023-08-08 ENCOUNTER — Other Ambulatory Visit: Payer: Self-pay

## 2023-08-08 ENCOUNTER — Ambulatory Visit
Admission: RE | Admit: 2023-08-08 | Discharge: 2023-08-08 | Disposition: A | Discharge status: Home / Self Care | Payer: PPO | Source: Ambulatory Visit | Attending: Radiation Oncology | Admitting: Radiation Oncology

## 2023-08-08 DIAGNOSIS — Z510 Encounter for antineoplastic radiation therapy: Secondary | ICD-10-CM | POA: Diagnosis not present

## 2023-08-08 LAB — RAD ONC ARIA SESSION SUMMARY
Course Elapsed Days: 23
Plan Fractions Treated to Date: 2
Plan Prescribed Dose Per Fraction: 2 Gy
Plan Total Fractions Prescribed: 6
Plan Total Prescribed Dose: 12 Gy
Reference Point Dosage Given to Date: 4 Gy
Reference Point Session Dosage Given: 2 Gy
Session Number: 17

## 2023-08-09 ENCOUNTER — Ambulatory Visit
Admission: RE | Admit: 2023-08-09 | Discharge: 2023-08-09 | Disposition: A | Discharge status: Home / Self Care | Payer: PPO | Source: Ambulatory Visit | Attending: Radiation Oncology | Admitting: Radiation Oncology

## 2023-08-09 ENCOUNTER — Other Ambulatory Visit: Payer: Self-pay

## 2023-08-09 DIAGNOSIS — Z510 Encounter for antineoplastic radiation therapy: Secondary | ICD-10-CM | POA: Diagnosis not present

## 2023-08-09 LAB — RAD ONC ARIA SESSION SUMMARY
Course Elapsed Days: 24
Plan Fractions Treated to Date: 3
Plan Prescribed Dose Per Fraction: 2 Gy
Plan Total Fractions Prescribed: 6
Plan Total Prescribed Dose: 12 Gy
Reference Point Dosage Given to Date: 6 Gy
Reference Point Session Dosage Given: 2 Gy
Session Number: 18

## 2023-08-10 ENCOUNTER — Other Ambulatory Visit: Payer: Self-pay

## 2023-08-10 ENCOUNTER — Ambulatory Visit
Admission: RE | Admit: 2023-08-10 | Discharge: 2023-08-10 | Disposition: A | Discharge status: Home / Self Care | Payer: PPO | Source: Ambulatory Visit | Attending: Radiation Oncology | Admitting: Radiation Oncology

## 2023-08-10 DIAGNOSIS — Z510 Encounter for antineoplastic radiation therapy: Secondary | ICD-10-CM | POA: Diagnosis not present

## 2023-08-10 LAB — RAD ONC ARIA SESSION SUMMARY
Course Elapsed Days: 25
Plan Fractions Treated to Date: 4
Plan Prescribed Dose Per Fraction: 2 Gy
Plan Total Fractions Prescribed: 6
Plan Total Prescribed Dose: 12 Gy
Reference Point Dosage Given to Date: 8 Gy
Reference Point Session Dosage Given: 2 Gy
Session Number: 19

## 2023-08-13 ENCOUNTER — Ambulatory Visit
Admission: RE | Admit: 2023-08-13 | Discharge: 2023-08-13 | Disposition: A | Discharge status: Home / Self Care | Source: Ambulatory Visit | Attending: Radiation Oncology | Admitting: Radiation Oncology

## 2023-08-13 ENCOUNTER — Ambulatory Visit: Payer: PPO

## 2023-08-13 ENCOUNTER — Ambulatory Visit: Payer: PPO | Admitting: Hematology and Oncology

## 2023-08-13 ENCOUNTER — Other Ambulatory Visit: Payer: Self-pay

## 2023-08-13 DIAGNOSIS — C50412 Malignant neoplasm of upper-outer quadrant of left female breast: Secondary | ICD-10-CM | POA: Diagnosis not present

## 2023-08-13 DIAGNOSIS — Z170 Estrogen receptor positive status [ER+]: Secondary | ICD-10-CM | POA: Diagnosis not present

## 2023-08-13 DIAGNOSIS — Z510 Encounter for antineoplastic radiation therapy: Secondary | ICD-10-CM | POA: Diagnosis not present

## 2023-08-13 LAB — RAD ONC ARIA SESSION SUMMARY
Course Elapsed Days: 28
Plan Fractions Treated to Date: 5
Plan Prescribed Dose Per Fraction: 2 Gy
Plan Total Fractions Prescribed: 6
Plan Total Prescribed Dose: 12 Gy
Reference Point Dosage Given to Date: 10 Gy
Reference Point Session Dosage Given: 2 Gy
Session Number: 20

## 2023-08-13 NOTE — Progress Notes (Unsigned)
  Cancer Center CONSULT NOTE  Patient Care Team: Daisy Floro, MD as PCP - General (Family Medicine) Jake Bathe, MD as PCP - Cardiology (Cardiology) Pershing Proud, RN as Oncology Nurse Navigator Donnelly Angelica, RN as Oncology Nurse Navigator Emelia Loron, MD as Consulting Physician (General Surgery) Rachel Moulds, MD as Consulting Physician (Hematology and Oncology) Antony Blackbird, MD as Consulting Physician (Radiation Oncology)  CHIEF COMPLAINTS/PURPOSE OF CONSULTATION:  Newly diagnosed breast cancer  HISTORY OF PRESENTING ILLNESS:  Casey Kirk 78 y.o. female is here because of recent diagnosis of left breast cancer  I reviewed her records extensively and collaborated the history with the patient.  SUMMARY OF ONCOLOGIC HISTORY: Oncology History  Malignant neoplasm of upper-outer quadrant of left breast in female, estrogen receptor positive (HCC)  03/28/2023 Mammogram   Screening mammogram showed focal asymmetry in the left breast, indeterminate, additional views with possible ultrasound are recommended.  Diagnostic mammogram confirmed a 7 mm focal asymmetry in the left breast.   04/25/2023 Pathology Results   Left breast needle core biopsy, mass at 3:00 5 cm from the nipple showed overall grade 1 invasive ductal carcinoma, prognostic showed estrogen 95% positive strong staining, progesterone 100% positive strong staining, Ki-67 of less than 1% and HER2 0   05/07/2023 Initial Diagnosis   Malignant neoplasm of upper-outer quadrant of left breast in female, estrogen receptor positive (HCC)   05/09/2023 Cancer Staging   Staging form: Breast, AJCC 8th Edition - Clinical stage from 05/09/2023: Stage IA (cT1a, cN0, cM0, G1, ER+, PR+, HER2-) - Signed by Rachel Moulds, MD on 05/09/2023 Stage prefix: Initial diagnosis Histologic grading system: 3 grade system   06/08/2023 Definitive Surgery   Left breast lumpectomy showed grade 1 IDC, 1.7 cms, negative  margins, ER 95% pos strong staining, PR 100% strong staining, Her 2 neg, Ki 67 1%    06/15/2023 Oncotype testing   Oncotype Dx of 7, no role for adjuvant chemotherapy.   07/04/2023 Cancer Staging   Staging form: Breast, AJCC 8th Edition - Pathologic stage from 07/04/2023: Stage IA (pT1c, pN0, cM0, G1, ER+, PR+, HER2-) - Signed by Erven Colla, PA-C on 07/04/2023 Stage prefix: Initial diagnosis Multigene prognostic tests performed: Oncotype DX Histologic grading system: 3 grade system    Discussed the use of AI scribe software for clinical note transcription with the patient, who gave verbal consent to proceed.  History of Present Illness    Casey Kirk is a 78 year old female who presents for follow-up after completing radiation therapy.  She has completed radiation therapy and is considering starting tamoxifen as part of her ongoing treatment plan. She experiences soreness and skin irritation as side effects of the radiation treatment, describing her skin as 'sore'.  She was recently started on Eliquis following a heart issue and surgery. Her experience with Eliquis has been 'okay so far'.  She has a history of osteoporosis and is concerned about the impact of medications on her bone density.  She has a history of a hysterectomy, which is relevant to her consideration of tamoxifen, as she does not have a uterus, eliminating concerns about tamoxifen's effects on the uterus.  She is managing blood pressure fluctuations, which she monitors closely. She is not currently taking Chantix or Zanaflex, as these medications have been discontinued.  MEDICAL HISTORY:  Past Medical History:  Diagnosis Date   Allergic rhinitis    Aortic atherosclerosis (HCC) 08/17/2016   Arthritis    ARTHRITIS, HX OF 01/09/2007  BIPOLAR II DISORDER 03/17/2008   Breast cancer (HCC)    Carcinoma of vaginal vault (HCC)    COPD 12/21/2006   COPD (chronic obstructive pulmonary disease) (HCC) 12/21/2006    Emphysema on CT 06/2016 02/2016 FEV1 of 98%     Coronary artery calcification 08/17/2016   Coronary artery disease involving native coronary artery of native heart with unstable angina pectoris (HCC)    Dysphasia 05/10/2011   Family history of early CAD 08/17/2016   GERD (gastroesophageal reflux disease)    GOUT 01/09/2007   Hx of colonic polyps    Hypertension    Hypothyroidism 10/17/2010   IBS (irritable bowel syndrome)    Osteoporosis    Sinusitis    Thyroid disease    TINEA CORPORIS 10/06/2009   Tobacco abuse 05/10/2011    SURGICAL HISTORY: Past Surgical History:  Procedure Laterality Date   ABDOMINAL HYSTERECTOMY     CA cells removed end of vagina   BREAST LUMPECTOMY WITH RADIOACTIVE SEED LOCALIZATION Left 06/08/2023   Procedure: LEFT BREAST LUMPECTOMY WITH RADIOACTIVE SEED LOCALIZATION;  Surgeon: Emelia Loron, MD;  Location: [facility] OR;  Service: General;  Laterality: Left;   BREAST SURGERY     biopsy   COLONOSCOPY     CORONARY PRESSURE/FFR STUDY N/A 03/16/2017   Procedure: INTRAVASCULAR PRESSURE WIRE/FFR STUDY;  Surgeon: Kathleene Hazel, MD;  Location: [facility] INVASIVE CV LAB;  Service: Cardiovascular;  Laterality: N/A;   CORONARY STENT INTERVENTION N/A 03/16/2017   Procedure: CORONARY STENT INTERVENTION;  Surgeon: Kathleene Hazel, MD;  Location: [facility] INVAS[facility] CV [facility];  Service: Cardiovascular;  Laterality: N/A;   EYE SURGERY Right    cataract   LEFT HEART CATH AND CORONARY ANGIOGRAPHY N/A 03/16/2017   Procedure: LEFT HEART CATH AND CORONARY ANGIOGRAPHY;  Surgeon: Kathleene Hazel, MD;  Location: [facility] CV [facility];  Service: Cardiovascular;  Laterality: N/A;   LEFT HEART CATH AND CORONARY ANGIOGRAPHY N/A 01/30/2018   Procedure: LEFT HEART CATH AND CORONARY ANGIOGRAPHY;  Surgeon: Kathleene Hazel, MD;  Location: [facility] CV [facility];  Service: Cardiovascular;  Laterality: N/A;   MASS EXCISION Right 05/10/2018   Procedure: EXCISION MASS OF RIGHT PALATAL   WITH LOCAL SOFT TISSUE RECONSTRUCTION;  Surgeon: Osborn Coho, MD;  Location: [facility] OR;  Service: ENT;  Laterality: Right;   TOOTH EXTRACTION     vaginal carcinoma surgery      SOCIAL HISTORY: Social History   Socioeconomic History   Marital status: Married    Spouse name: Not on file   Number of children: 2   Years of education: Not on file   Highest education level: Not on file  Occupational History   Occupation: retired  Tobacco Use   Smoking status: Every Day    Current packs/day: 0.20    Average packs/day: 0.2 packs/day for 40.0 years (8.0 ttl pk-yrs)    Types: Cigarettes   Smokeless tobacco: Never  Vaping Use   Vaping status: Never Used  Substance and Sexual Activity   Alcohol use: Not Currently    Comment: Rarely    Drug use: No   Sexual activity: Yes  Other Topics Concern   Not on file  Social History Narrative   Lives alone.  She has two grown children.   She is retired from working at [employer].   Highest level of education:  2 year business college   Social Drivers of Health   Financial Resource Strain: Not on file  Food Insecurity: No Food Insecurity (05/09/2023)   Hunger  Vital Sign    Worried About Programme researcher, broadcasting/film/video in the Last Year: Never true    Ran Out of Food in the Last Year: Never true  Transportation Needs: No Transportation Needs (05/09/2023)   PRAPARE - Administrator, Civil Service (Medical): No    Lack of Transportation (Non-Medical): No  Physical Activity: Not on file  Stress: Not on file  Social Connections: Not on file  Intimate Partner Violence: Not At Risk (05/09/2023)   Humiliation, Afraid, Rape, and Kick questionnaire    Fear of Current or Ex-Partner: No    Emotionally Abused: No    Physically Abused: No    Sexually Abused: No    FAMILY HISTORY: Family History  Problem Relation Age of Onset   Colon polyps Mother        unknown number   Tremor Mother    COPD Mother    Colon polyps Brother        unknown number    Heart attack Brother 79   Alcohol abuse Other    Arthritis Other    Diabetes Other    Hyperlipidemia Other    Hypertension Other    Heart disease Other    Sudden death Other    Pancreatic cancer Cousin        maternal female cousin   Colon cancer Neg Hx     ALLERGIES:  is allergic to hydrocodone-acetaminophen, latex, and pneumococcal polysaccharide vaccine.  MEDICATIONS:  Current Outpatient Medications  Medication Sig Dispense Refill   tamoxifen (NOLVADEX) 20 MG tablet Take 1 tablet (20 mg total) by mouth daily. 90 tablet 3   acetaminophen (TYLENOL) 500 MG tablet Take 500 mg by mouth every 8 (eight) hours as needed (pain).     albuterol (PROVENTIL HFA;VENTOLIN HFA) 108 (90 Base) MCG/ACT inhaler Inhale 2 puffs into the lungs every 6 (six) hours as needed for wheezing or shortness of breath. 1 Inhaler 5   apixaban (ELIQUIS) 5 MG TABS tablet Take 1 tablet (5 mg total) by mouth 2 (two) times daily. 60 tablet 6   Ascorbic Acid (VITAMIN C PO) Take 1 tablet by mouth daily.     Calcium Citrate-Vitamin D (CALCIUM + D PO) Take 1 tablet by mouth daily.     diphenhydrAMINE (BENADRYL) 25 mg capsule Take 25 mg by mouth daily as needed for allergies.     fluticasone (FLONASE) 50 MCG/ACT nasal spray Place 2 sprays into both nostrils daily as needed for allergies or rhinitis.     GARLIC PO Take 1 tablet by mouth 2 (two) times a week.     guaiFENesin (MUCINEX) 600 MG 12 hr tablet Take 600 mg by mouth as needed for cough or to loosen phlegm.     loperamide (IMODIUM A-D) 2 MG tablet Take 2 mg by mouth 4 (four) times daily as needed for diarrhea or loose stools.     melatonin 5 MG TABS Take 5 mg by mouth at bedtime.     nicotine polacrilex (NICORETTE) 2 MG gum Take 2 mg by mouth as needed for smoking cessation.     nitroGLYCERIN (NITROSTAT) 0.4 MG SL tablet Place 0.4 mg under the tongue every 5 (five) minutes as needed for chest pain.     Omega-3 Fatty Acids (FISH OIL PO) Take 1 capsule by mouth 3 (three)  times a week.     rosuvastatin (CRESTOR) 20 MG tablet Take 20 mg by mouth daily.      tiZANidine (ZANAFLEX) 2 MG tablet  Take 2 mg by mouth 3 (three) times daily as needed for muscle spasms.     traZODone (DESYREL) 50 MG tablet Take 0.5-1 tablets (25-50 mg total) by mouth at bedtime as needed. (Patient taking differently: Take 25-50 mg by mouth at bedtime.) 90 tablet 3   TURMERIC PO Take 1 tablet by mouth daily.     varenicline (CHANTIX) 1 MG tablet Take 1 tablet (1 mg total) by mouth 2 (two) times daily. 60 tablet 5   Varenicline Tartrate, Starter, 0.5 MG X 11 & 1 MG X 42 TBPK Take 0.5 mg by mouth as directed. Take 0.5 mg X 3 days, then 0.5 mg twice daily (day 4-7) then 1 mg twice a day 53 each 0   VITAMIN A PO Take 1 capsule by mouth daily.     VITAMIN E PO Take 1 capsule by mouth daily.     No current facility-administered medications for this visit.    REVIEW OF SYSTEMS:   Constitutional: Denies fevers, chills or abnormal night sweats Eyes: Denies blurriness of vision, double vision or watery eyes Ears, nose, mouth, throat, and face: Denies mucositis or sore throat Respiratory: Denies cough, dyspnea or wheezes Cardiovascular: Denies palpitation, chest discomfort or lower extremity swelling Gastrointestinal:  Denies nausea, heartburn or change in bowel habits Skin: Denies abnormal skin rashes Lymphatics: Denies new lymphadenopathy or easy bruising Neurological:Denies numbness, tingling or new weaknesses Behavioral/Psych: Mood is stable, no new changes  Breast: Denies any palpable lumps or discharge All other systems were reviewed with the patient and are negative.  PHYSICAL EXAMINATION: ECOG PERFORMANCE STATUS: 0 - Asymptomatic  Vitals:   08/14/23 1005  BP: (!) 157/91  Pulse: 60  Resp: 18  Temp: 98 F (36.7 C)  SpO2: 95%    Filed Weights   08/14/23 1005  Weight: 132 lb (59.9 kg)     GENERAL:alert, no distress and comfortable,    LABORATORY DATA:  I have reviewed  the data as listed Lab Results  Component Value Date   WBC 9.2 05/09/2023   HGB 15.5 (H) 05/09/2023   HCT 46.0 05/09/2023   MCV 87.3 05/09/2023   PLT 196 05/09/2023   Lab Results  Component Value Date   NA 141 05/09/2023   K 4.0 05/09/2023   CL 104 05/09/2023   CO2 30 05/09/2023    RADIOGRAPHIC STUDIES: I have personally reviewed the radiological reports and agreed with the findings in the report.  ASSESSMENT AND PLAN:  Malignant neoplasm of upper-outer quadrant of left breast in female, estrogen receptor positive (HCC) This is a very pleasant 78 year old female patient with newly diagnosed left breast grade 1 invasive ductal carcinoma, ER PR positive, HER2 0, Ki-67 of less than 1% referred to breast [facility] for additional recommendations.  She is s.p lumpectomy, oncotype Dx of 7, now undergoing adj radiation. She is here to discuss about anti estrogen therapy.  Breast Cancer Completed radiation therapy. Discussed anti-estrogen therapy options. Chose tamoxifen due to bone density concerns and her use of Eliquis which will help with the increased risk of DVT/PE with tamoxifen. Explained potential side effects and benefits. She had partial hysterectomy, hence endometrial complications are irrelevant. - Prescribe tamoxifen to start mid-April. - Schedule follow-up in July to assess response in [facility] clinic. - Advise to report any issues with tamoxifen.  Osteoporosis Osteoporosis influenced the choice of tamoxifen for its bone density benefits.  Atrial Fibrillation On Eliquis, no adverse effects reported.  Hypertension Blood pressure fluctuations noted. Tamoxifen's effect on blood  pressure is unlikely but possible. - Monitor blood pressure regularly.  Rachel Moulds MD      All questions were answered. The patient knows to call the clinic with any problems, questions or concerns.    Rachel Moulds, MD 08/14/23

## 2023-08-14 ENCOUNTER — Inpatient Hospital Stay: Admitting: Hematology and Oncology

## 2023-08-14 ENCOUNTER — Other Ambulatory Visit: Payer: Self-pay

## 2023-08-14 ENCOUNTER — Ambulatory Visit
Admission: RE | Admit: 2023-08-14 | Discharge: 2023-08-14 | Disposition: A | Discharge status: Home / Self Care | Source: Ambulatory Visit | Attending: Radiation Oncology | Admitting: Radiation Oncology

## 2023-08-14 VITALS — BP 157/91 | HR 60 | Temp 98.0°F | Resp 18 | Wt 132.0 lb

## 2023-08-14 DIAGNOSIS — Z923 Personal history of irradiation: Secondary | ICD-10-CM | POA: Insufficient documentation

## 2023-08-14 DIAGNOSIS — I4891 Unspecified atrial fibrillation: Secondary | ICD-10-CM | POA: Insufficient documentation

## 2023-08-14 DIAGNOSIS — Z7901 Long term (current) use of anticoagulants: Secondary | ICD-10-CM | POA: Insufficient documentation

## 2023-08-14 DIAGNOSIS — I10 Essential (primary) hypertension: Secondary | ICD-10-CM | POA: Insufficient documentation

## 2023-08-14 DIAGNOSIS — Z170 Estrogen receptor positive status [ER+]: Secondary | ICD-10-CM

## 2023-08-14 DIAGNOSIS — C50412 Malignant neoplasm of upper-outer quadrant of left female breast: Secondary | ICD-10-CM

## 2023-08-14 DIAGNOSIS — Z90710 Acquired absence of both cervix and uterus: Secondary | ICD-10-CM | POA: Insufficient documentation

## 2023-08-14 DIAGNOSIS — M810 Age-related osteoporosis without current pathological fracture: Secondary | ICD-10-CM | POA: Insufficient documentation

## 2023-08-14 DIAGNOSIS — I499 Cardiac arrhythmia, unspecified: Secondary | ICD-10-CM | POA: Insufficient documentation

## 2023-08-14 DIAGNOSIS — Z510 Encounter for antineoplastic radiation therapy: Secondary | ICD-10-CM | POA: Diagnosis not present

## 2023-08-14 DIAGNOSIS — F17210 Nicotine dependence, cigarettes, uncomplicated: Secondary | ICD-10-CM | POA: Insufficient documentation

## 2023-08-14 LAB — RAD ONC ARIA SESSION SUMMARY
Course Elapsed Days: 29
Plan Fractions Treated to Date: 6
Plan Prescribed Dose Per Fraction: 2 Gy
Plan Total Fractions Prescribed: 6
Plan Total Prescribed Dose: 12 Gy
Reference Point Dosage Given to Date: 12 Gy
Reference Point Session Dosage Given: 2 Gy
Session Number: 21

## 2023-08-14 MED ORDER — TAMOXIFEN CITRATE 20 MG PO TABS: 20.0000 mg | ORAL_TABLET | Freq: Every day | ORAL | 3 refills | Status: DC

## 2023-08-14 MED ORDER — RADIAPLEXRX EX GEL: Freq: Once | CUTANEOUS | Status: AC

## 2023-08-14 NOTE — Assessment & Plan Note (Signed)
 This is a very pleasant 78 year old female patient with newly diagnosed left breast grade 1 invasive ductal carcinoma, ER PR positive, HER2 0, Ki-67 of less than 1% referred to breast [facility] for additional recommendations.  She is s.p lumpectomy, oncotype Dx of 7, now undergoing adj radiation. She is here to discuss about anti estrogen therapy.  Breast Cancer Completed radiation therapy. Discussed anti-estrogen therapy options. Chose tamoxifen due to bone density concerns and her use of Eliquis which will help with the increased risk of DVT/PE with tamoxifen. Explained potential side effects and benefits. She had partial hysterectomy, hence endometrial complications are irrelevant. - Prescribe tamoxifen to start mid-April. - Schedule follow-up in July to assess response in [facility] clinic. - Advise to report any issues with tamoxifen.  Osteoporosis Osteoporosis influenced the choice of tamoxifen for its bone density benefits.  Atrial Fibrillation On Eliquis, no adverse effects reported.  Hypertension Blood pressure fluctuations noted. Tamoxifen's effect on blood pressure is unlikely but possible. - Monitor blood pressure regularly.  Rachel Moulds MD

## 2023-08-15 NOTE — Radiation Completion Notes (Addendum)
  Radiation Oncology         [phone] ________________________________  Name: Casey Kirk MRN: [id number]  Date of Service: 08/14/2023  DOB: 1946/03/31  End of Treatment Note   Diagnosis: Stage 1A (pT1c, N0, M0) low grade Invasive Ductal Carcinoma of the left breast, ER/PR+, HER2  Intent: Curative     ==========DELIVERED PLANS==========  First Treatment Date: 2023-07-16 Last Treatment Date: 2023-08-14   Plan Name: Breast_L_BH Site: Breast, Left Technique: 3D Mode: Photon Dose Per Fraction: 2.67 Gy Prescribed Dose (Delivered / Prescribed): 40.05 Gy / 40.05 Gy Prescribed Fxs (Delivered / Prescribed): 15 / 15   Plan Name: Brst_L_Bst Site: Breast, Left Technique: Electron Mode: Electron Dose Per Fraction: 2 Gy Prescribed Dose (Delivered / Prescribed): 12 Gy / 12 Gy Prescribed Fxs (Delivered / Prescribed): 6 / 6     ====================================   The patient tolerated radiation. She experienced sharp, shooting pains throughout the left breast, and developed fatigue and anticipated skin changes in the treatment field.   The patient will return in one month and will continue follow up with Dr. Al Pimple as well.      Joyice Faster, PA-C

## 2023-08-17 ENCOUNTER — Telehealth: Payer: Self-pay | Admitting: Cardiology

## 2023-08-17 NOTE — Telephone Encounter (Signed)
 Pt c/o medication issue:  1. Name of Medication:  apixaban (ELIQUIS) 5 MG TABS tablet  2. How are you currently taking this medication (dosage and times per day)?  As prescribed  3. Are you having a reaction (difficulty breathing--STAT)?   4. What is your medication issue?   Patient states the cancer center put her on Tamoxifen 20 MG once daily and she would like to know if it will interact with Eliquis.

## 2023-08-17 NOTE — Telephone Encounter (Signed)
 Called patient back to let her know that the pharmacist advised there is no drug interactions between tamoxifen and eliquis. Patient was wondering if she needed any lab work. Will forward to Dr. Anne Fu and his nurse to advise on lab work.

## 2023-08-17 NOTE — Telephone Encounter (Signed)
 No labs currently needed

## 2023-09-01 ENCOUNTER — Other Ambulatory Visit: Payer: Self-pay | Admitting: Physician Assistant

## 2023-09-12 ENCOUNTER — Encounter: Payer: Self-pay | Admitting: Radiation Oncology

## 2023-09-12 NOTE — Progress Notes (Signed)
 Radiation Oncology         [phone] ________________________________  Name: Casey Kirk MRN: [id number]  Date: 09/13/2023  DOB: 1946/02/10  Follow-Up Visit Note  CC: Jimmey Mould, MD  Jimmey Mould, MD  No diagnosis found.  Diagnosis:  Stage 1A (pT1c, N0, M0) low grade Invasive Ductal Carcinoma of the left breast, ER/PR+, HER2   Interval Since Last Radiation:  1 month  Intent: Curative  First Treatment Date: 2023-07-16 Last Treatment Date: 2023-08-14   Plan Name: Breast_L_BH Site: Breast, Left Technique: 3D Mode: Photon Dose Per Fraction: 2.67 Gy Prescribed Dose (Delivered / Prescribed): 40.05 Gy / 40.05 Gy Prescribed Fxs (Delivered / Prescribed): 15 / 15   Plan Name: Brst_L_Bst Site: Breast, Left Technique: Electron Mode: Electron Dose Per Fraction: 2 Gy Prescribed Dose (Delivered / Prescribed): 12 Gy / 12 Gy Prescribed Fxs (Delivered / Prescribed): 6 / 6  Narrative:  The patient returns today for routine follow-up. She was last seen in office on 07/04/23 for her initial consult . Since then, patient completed her radiation treatment which she tolerated quite well. Patient did however endorse experiencing sharp, shooting pains throughout the left breast, and developed fatigue and anticipated skin changes in the treatment field.   In the interval since then, she presented for a follow up with Dr. Arno Bibles on 08/14/23. During that visit she decided to proceed with tamoxifen  for its bone density benefits. Adjuvant therapy will be initiated mid April.   No other significant oncologic interval history since the patient was last seen.                                Allergies:  is allergic to hydrocodone-acetaminophen , latex, and pneumococcal polysaccharide vaccine.  Meds: Current Outpatient Medications  Medication Sig Dispense Refill   acetaminophen  (TYLENOL ) 500 MG tablet Take 500 mg by mouth every 8 (eight) hours as needed (pain).     albuterol  (PROVENTIL   HFA;VENTOLIN  HFA) 108 (90 Base) MCG/ACT inhaler Inhale 2 puffs into the lungs every 6 (six) hours as needed for wheezing or shortness of breath. 1 Inhaler 5   apixaban  (ELIQUIS ) 5 MG TABS tablet Take 1 tablet (5 mg total) by mouth 2 (two) times daily. 60 tablet 6   Ascorbic Acid (VITAMIN C PO) Take 1 tablet by mouth daily.     Calcium Citrate-Vitamin D (CALCIUM + D PO) Take 1 tablet by mouth daily.     diphenhydrAMINE (BENADRYL) 25 mg capsule Take 25 mg by mouth daily as needed for allergies.     fluticasone  (FLONASE ) 50 MCG/ACT nasal spray Place 2 sprays into both nostrils daily as needed for allergies or rhinitis.     GARLIC PO Take 1 tablet by mouth 2 (two) times a week.     guaiFENesin (MUCINEX) 600 MG 12 hr tablet Take 600 mg by mouth as needed for cough or to loosen phlegm.     loperamide (IMODIUM A-D) 2 MG tablet Take 2 mg by mouth 4 (four) times daily as needed for diarrhea or loose stools.     melatonin 5 MG TABS Take 5 mg by mouth at bedtime.     nicotine  polacrilex (NICORETTE) 2 MG gum Take 2 mg by mouth as needed for smoking cessation.     nitroGLYCERIN  (NITROSTAT ) 0.4 MG SL tablet Place 0.4 mg under the tongue every 5 (five) minutes as needed for chest pain.     Omega-3 Fatty Acids (  FISH OIL PO) Take 1 capsule by mouth 3 (three) times a week.     rosuvastatin (CRESTOR) 20 MG tablet Take 20 mg by mouth daily.      tamoxifen  (NOLVADEX ) 20 MG tablet Take 1 tablet (20 mg total) by mouth daily. 90 tablet 3   tiZANidine (ZANAFLEX) 2 MG tablet Take 2 mg by mouth 3 (three) times daily as needed for muscle spasms.     traZODone  (DESYREL ) 50 MG tablet TAKE 1/2 TO 1 TABLET BY MOUTH AT BEDTIME AS NEEDED 90 tablet 0   TURMERIC PO Take 1 tablet by mouth daily.     varenicline  (CHANTIX ) 1 MG tablet Take 1 tablet (1 mg total) by mouth 2 (two) times daily. 60 tablet 5   Varenicline  Tartrate, Starter, 0.5 MG X 11 & 1 MG X 42 TBPK Take 0.5 mg by mouth as directed. Take 0.5 mg X 3 days, then 0.5 mg twice  daily (day 4-7) then 1 mg twice a day 53 each 0   VITAMIN A PO Take 1 capsule by mouth daily.     VITAMIN E PO Take 1 capsule by mouth daily.     No current facility-administered medications for this encounter.    Physical Findings: The patient is in no acute distress. Patient is alert and oriented.  vitals were not taken for this visit. .  No significant changes. Lungs are clear to auscultation bilaterally. Heart has regular rate and rhythm. No palpable cervical, supraclavicular, or axillary adenopathy. Abdomen soft, non-tender, normal bowel sounds.   Lab Findings: Lab Results  Component Value Date   WBC 9.2 05/09/2023   HGB 15.5 (H) 05/09/2023   HCT 46.0 05/09/2023   MCV 87.3 05/09/2023   PLT 196 05/09/2023    Radiographic Findings: No results found.  Impression: Stage 1A (pT1c, N0, M0) low grade Invasive Ductal Carcinoma of the left breast, ER/PR+, HER2   The patient is recovering from the effects of radiation.  ***  Plan:  ***   *** minutes of total time was spent for this patient encounter, including preparation, face-to-face counseling with the patient and coordination of care, physical exam, and documentation of the encounter. ____________________________________  Noralee Beam, PhD, MD  This document serves as a record of services personally performed by Retta Caster, MD. It was created on his behalf by Lucky Sable, a trained medical scribe. The creation of this record is based on the scribe's personal observations and the provider's statements to them. This document has been checked and approved by the attending provider.

## 2023-09-13 ENCOUNTER — Ambulatory Visit
Admission: RE | Admit: 2023-09-13 | Discharge: 2023-09-13 | Disposition: A | Discharge status: Home / Self Care | Source: Ambulatory Visit | Attending: Radiation Oncology | Admitting: Radiation Oncology

## 2023-09-13 ENCOUNTER — Encounter: Payer: Self-pay | Admitting: Radiation Oncology

## 2023-09-13 VITALS — BP 115/88 | HR 51 | Temp 98.4°F | Resp 18 | Ht 62.0 in | Wt 131.4 lb

## 2023-09-13 DIAGNOSIS — Z170 Estrogen receptor positive status [ER+]: Secondary | ICD-10-CM

## 2023-09-13 HISTORY — DX: Personal history of irradiation: Z92.3

## 2023-09-13 NOTE — Progress Notes (Signed)
 Casey Kirk is here today for follow up post radiation to the breast.   Breast Side:Left   They completed their radiation on: 08/14/23   Does the patient complain of any of the following: Post radiation skin issues: She reports some darkening underneath breast. Breast Tenderness: Yes, she reports tenderness in nipple area. Breast Swelling: She reports unsure about swelling. Lymphadema: Denies Range of Motion limitations: Denies Fatigue post radiation: Yes Appetite good/fair/poor: Good Energy: Poor   BP 115/88 (BP Location: Left Arm, Patient Position: Sitting)   Pulse (!) 51   Temp 98.4 F (36.9 C) (Oral)   Resp 18   Ht 5\' 2"  (1.575 m)   Wt 131 lb 6.4 oz (59.6 kg)   SpO2 100%   BMI 24.03 kg/m

## 2023-11-13 DIAGNOSIS — E7800 Pure hypercholesterolemia, unspecified: Secondary | ICD-10-CM | POA: Diagnosis not present

## 2023-11-13 DIAGNOSIS — E559 Vitamin D deficiency, unspecified: Secondary | ICD-10-CM | POA: Diagnosis not present

## 2023-11-13 DIAGNOSIS — I10 Essential (primary) hypertension: Secondary | ICD-10-CM | POA: Diagnosis not present

## 2023-11-13 DIAGNOSIS — Z79899 Other long term (current) drug therapy: Secondary | ICD-10-CM | POA: Diagnosis not present

## 2023-11-14 ENCOUNTER — Ambulatory Visit: Payer: PPO | Admitting: Physician Assistant

## 2023-11-19 DIAGNOSIS — L0390 Cellulitis, unspecified: Secondary | ICD-10-CM | POA: Diagnosis not present

## 2023-11-19 DIAGNOSIS — Z779 Other contact with and (suspected) exposures hazardous to health: Secondary | ICD-10-CM | POA: Diagnosis not present

## 2023-11-19 DIAGNOSIS — Z170 Estrogen receptor positive status [ER+]: Secondary | ICD-10-CM | POA: Diagnosis not present

## 2023-11-19 DIAGNOSIS — Z6822 Body mass index (BMI) 22.0-22.9, adult: Secondary | ICD-10-CM | POA: Diagnosis not present

## 2023-11-19 DIAGNOSIS — F17200 Nicotine dependence, unspecified, uncomplicated: Secondary | ICD-10-CM | POA: Diagnosis not present

## 2023-11-19 DIAGNOSIS — Z1272 Encounter for screening for malignant neoplasm of vagina: Secondary | ICD-10-CM | POA: Diagnosis not present

## 2023-11-19 DIAGNOSIS — L900 Lichen sclerosus et atrophicus: Secondary | ICD-10-CM | POA: Diagnosis not present

## 2023-11-19 DIAGNOSIS — C50912 Malignant neoplasm of unspecified site of left female breast: Secondary | ICD-10-CM | POA: Diagnosis not present

## 2023-11-19 DIAGNOSIS — N952 Postmenopausal atrophic vaginitis: Secondary | ICD-10-CM | POA: Diagnosis not present

## 2023-11-19 DIAGNOSIS — Z1151 Encounter for screening for human papillomavirus (HPV): Secondary | ICD-10-CM | POA: Diagnosis not present

## 2023-11-20 DIAGNOSIS — Z79899 Other long term (current) drug therapy: Secondary | ICD-10-CM | POA: Diagnosis not present

## 2023-11-20 DIAGNOSIS — R5383 Other fatigue: Secondary | ICD-10-CM | POA: Diagnosis not present

## 2023-11-20 DIAGNOSIS — E7800 Pure hypercholesterolemia, unspecified: Secondary | ICD-10-CM | POA: Diagnosis not present

## 2023-11-20 DIAGNOSIS — Z0000 Encounter for general adult medical examination without abnormal findings: Secondary | ICD-10-CM | POA: Diagnosis not present

## 2023-11-20 DIAGNOSIS — E559 Vitamin D deficiency, unspecified: Secondary | ICD-10-CM | POA: Diagnosis not present

## 2023-11-20 DIAGNOSIS — I10 Essential (primary) hypertension: Secondary | ICD-10-CM | POA: Diagnosis not present

## 2023-11-20 DIAGNOSIS — J383 Other diseases of vocal cords: Secondary | ICD-10-CM | POA: Diagnosis not present

## 2023-11-20 DIAGNOSIS — M810 Age-related osteoporosis without current pathological fracture: Secondary | ICD-10-CM | POA: Diagnosis not present

## 2023-11-20 DIAGNOSIS — R252 Cramp and spasm: Secondary | ICD-10-CM | POA: Diagnosis not present

## 2023-11-20 DIAGNOSIS — J449 Chronic obstructive pulmonary disease, unspecified: Secondary | ICD-10-CM | POA: Diagnosis not present

## 2023-11-20 DIAGNOSIS — I4891 Unspecified atrial fibrillation: Secondary | ICD-10-CM | POA: Diagnosis not present

## 2023-11-20 DIAGNOSIS — Z6823 Body mass index (BMI) 23.0-23.9, adult: Secondary | ICD-10-CM | POA: Diagnosis not present

## 2023-11-21 DIAGNOSIS — N644 Mastodynia: Secondary | ICD-10-CM | POA: Diagnosis not present

## 2023-11-27 ENCOUNTER — Telehealth: Payer: Self-pay | Admitting: *Deleted

## 2023-11-27 ENCOUNTER — Telehealth: Payer: Self-pay | Admitting: Adult Health

## 2023-11-27 NOTE — Telephone Encounter (Signed)
 Called to reschedule patient appointment to due provider pal  request. I talked  to patient and they are aware of the changes that was made to the upcoming appointment

## 2023-11-27 NOTE — Telephone Encounter (Signed)
 This RN spoke with pt per multiple questions she had with scheduler about appt being rescheduled.  Pt would like to see Dr Loretha -and even with explanation of SCP appt and provider - she wants to see Dr Loretha.  Appt scheduled by this RN with pt stating understanding of plan.

## 2023-11-27 NOTE — Telephone Encounter (Signed)
 Called to reschedule patient appointment to due provider pal  request. I talked  to patient and they are aware of the changes that was made to the upcoming appointment . Patient  wantto cancel the SCP and make follow up appointment with Dr.Iruku . Patient was schedule per val

## 2023-12-05 ENCOUNTER — Encounter: Admitting: Adult Health

## 2023-12-12 ENCOUNTER — Telehealth: Payer: Self-pay

## 2023-12-13 ENCOUNTER — Inpatient Hospital Stay: Attending: Hematology and Oncology | Admitting: Hematology and Oncology

## 2023-12-13 VITALS — BP 153/91 | HR 53 | Temp 98.6°F | Resp 20 | Wt 130.9 lb

## 2023-12-13 DIAGNOSIS — I482 Chronic atrial fibrillation, unspecified: Secondary | ICD-10-CM | POA: Diagnosis not present

## 2023-12-13 DIAGNOSIS — Z79810 Long term (current) use of selective estrogen receptor modulators (SERMs): Secondary | ICD-10-CM | POA: Diagnosis not present

## 2023-12-13 DIAGNOSIS — Z800 Family history of malignant neoplasm of digestive organs: Secondary | ICD-10-CM | POA: Insufficient documentation

## 2023-12-13 DIAGNOSIS — H43813 Vitreous degeneration, bilateral: Secondary | ICD-10-CM | POA: Diagnosis not present

## 2023-12-13 DIAGNOSIS — H26493 Other secondary cataract, bilateral: Secondary | ICD-10-CM | POA: Diagnosis not present

## 2023-12-13 DIAGNOSIS — F17210 Nicotine dependence, cigarettes, uncomplicated: Secondary | ICD-10-CM | POA: Diagnosis not present

## 2023-12-13 DIAGNOSIS — Z7901 Long term (current) use of anticoagulants: Secondary | ICD-10-CM | POA: Insufficient documentation

## 2023-12-13 DIAGNOSIS — H0100A Unspecified blepharitis right eye, upper and lower eyelids: Secondary | ICD-10-CM | POA: Diagnosis not present

## 2023-12-13 DIAGNOSIS — H04123 Dry eye syndrome of bilateral lacrimal glands: Secondary | ICD-10-CM | POA: Diagnosis not present

## 2023-12-13 DIAGNOSIS — Z170 Estrogen receptor positive status [ER+]: Secondary | ICD-10-CM | POA: Insufficient documentation

## 2023-12-13 DIAGNOSIS — C50412 Malignant neoplasm of upper-outer quadrant of left female breast: Secondary | ICD-10-CM | POA: Diagnosis not present

## 2023-12-13 DIAGNOSIS — H524 Presbyopia: Secondary | ICD-10-CM | POA: Diagnosis not present

## 2023-12-13 DIAGNOSIS — H0100B Unspecified blepharitis left eye, upper and lower eyelids: Secondary | ICD-10-CM | POA: Diagnosis not present

## 2023-12-13 DIAGNOSIS — Z923 Personal history of irradiation: Secondary | ICD-10-CM | POA: Insufficient documentation

## 2023-12-13 DIAGNOSIS — H5212 Myopia, left eye: Secondary | ICD-10-CM | POA: Diagnosis not present

## 2023-12-13 MED ORDER — TAMOXIFEN CITRATE 10 MG PO TABS: 10.0000 mg | ORAL_TABLET | Freq: Every day | ORAL | 1 refills | Status: AC

## 2023-12-13 NOTE — Progress Notes (Signed)
 [facility] CONSULT NOTE  Patient Care Team: Okey Carlin Redbird, MD as PCP - General (Family Medicine) Jeffrie Oneil BROCKS, MD as PCP - Cardiology (Cardiology) Glean Stephane BROCKS, RN (Inactive) as Oncology Nurse Navigator Tyree Nanetta SAILOR, RN as Oncology Nurse Navigator Ebbie Cough, MD as Consulting Physician (General Surgery) Loretha Ash, MD as Consulting Physician (Hematology and Oncology) Shannon Agent, MD as Consulting Physician (Radiation Oncology)  CHIEF COMPLAINTS/PURPOSE OF CONSULTATION:  Newly diagnosed breast cancer  HISTORY OF PRESENTING ILLNESS:  Casey Kirk 78 y.o. female is here because of recent diagnosis of left breast cancer  I reviewed her records extensively and collaborated the history with the patient.  SUMMARY OF ONCOLOGIC HISTORY: Oncology History  Malignant neoplasm of upper-outer quadrant of left breast in female, estrogen receptor positive (HCC)  03/28/2023 Mammogram   Screening mammogram showed focal asymmetry in the left breast, indeterminate, additional views with possible ultrasound are recommended.  Diagnostic mammogram confirmed a 7 mm focal asymmetry in the left breast.   04/25/2023 Pathology Results   Left breast needle core biopsy, mass at 3:00 5 cm from the nipple showed overall grade 1 invasive ductal carcinoma, prognostic showed estrogen 95% positive strong staining, progesterone 100% positive strong staining, Ki-67 of less than 1% and HER2 0   05/07/2023 Initial Diagnosis   Malignant neoplasm of upper-outer quadrant of left breast in female, estrogen receptor positive (HCC)   05/09/2023 Cancer Staging   Staging form: Breast, AJCC 8th Edition - Clinical stage from 05/09/2023: Stage IA (cT1a, cN0, cM0, G1, ER+, PR+, HER2-) - Signed by Loretha Ash, MD on 05/09/2023 Stage prefix: Initial diagnosis Histologic grading system: 3 grade system   06/08/2023 Definitive Surgery   Left breast lumpectomy showed grade 1 IDC, 1.7 cms,  negative margins, ER 95% pos strong staining, PR 100% strong staining, Her 2 neg, Ki 67 1%    06/15/2023 Oncotype testing   Oncotype Dx of 7, no role for adjuvant chemotherapy.   07/04/2023 Cancer Staging   Staging form: Breast, AJCC 8th Edition - Pathologic stage from 07/04/2023: Stage IA (pT1c, pN0, cM0, G1, ER+, PR+, HER2-) - Signed by Wyatt Leeroy HERO, PA-C on 07/04/2023 Stage prefix: Initial diagnosis Multigene prognostic tests performed: Oncotype DX Histologic grading system: 3 grade system    Discussed the use of AI scribe software for clinical note transcription with the patient, who gave verbal consent to proceed.  History of Present Illness    Casey Kirk is a 78 year old female who presents for follow-up after completing radiation therapy.  Discussed the use of AI scribe software for clinical note transcription with the patient, who gave verbal consent to proceed.  History of Present Illness Casey Kirk is a 78 year old female with atrial fibrillation and breast cancer who presents with unbearable side effects from her breast cancer medication.  She started experiencing unbearable side effects from her breast cancer medication in April, including hot flashes, warmth, and pain that sometimes radiates through her body. These symptoms have persisted since she completed radiation therapy.  She is currently taking Tamoxifen  and Eliquis  for her atrial fibrillation. She attributes bruising to the blood-thinning effects of Eliquis .  She reports fatigue and is interested in a clinical trial for fatigue management.  No new fevers or chills. She reports ongoing warmth and pain.    MEDICAL HISTORY:  Past Medical History:  Diagnosis Date   Allergic rhinitis    Aortic atherosclerosis (HCC) 08/17/2016   Arthritis  ARTHRITIS, HX OF 01/09/2007   BIPOLAR II DISORDER 03/17/2008   Breast cancer (HCC)    Carcinoma of vaginal vault (HCC)    COPD 12/21/2006   COPD (chronic  obstructive pulmonary disease) (HCC) 12/21/2006   Emphysema on CT 06/2016 02/2016 FEV1 of 98%     Coronary artery calcification 08/17/2016   Coronary artery disease involving native coronary artery of native heart with unstable angina pectoris (HCC)    Dysphasia 05/10/2011   Family history of early CAD 08/17/2016   GERD (gastroesophageal reflux disease)    GOUT 01/09/2007   History of radiation therapy    Left breast-07/16/23-08/14/23- Dr. Lynwood Nasuti   Hx of colonic polyps    Hypertension    Hypothyroidism 10/17/2010   IBS (irritable bowel syndrome)    Osteoporosis    Sinusitis    Thyroid  disease    TINEA CORPORIS 10/06/2009   Tobacco abuse 05/10/2011    SURGICAL HISTORY: Past Surgical History:  Procedure Laterality Date   ABDOMINAL HYSTERECTOMY     CA cells removed end of vagina   BREAST LUMPECTOMY WITH RADIOACTIVE SEED LOCALIZATION Left 06/08/2023   Procedure: LEFT BREAST LUMPECTOMY WITH RADIOACTIVE SEED LOCALIZATION;  Surgeon: Ebbie Cough, MD;  Location: [facility] OR;  Service: General;  Laterality: Left;   BREAST SURGERY     biopsy   COLONOSCOPY     CORONARY PRESSURE/FFR STUDY N/A 03/16/2017   Procedure: INTRAVASCULAR PRESSURE WIRE/FFR STUDY;  Surgeon: Verlin Lonni BIRCH, MD;  Location: [facility] INVASIVE CV LAB;  Service: Cardiovascular;  Laterality: N/A;   CORONARY STENT INTERVENTION N/A 03/16/2017   Procedure: CORONARY STENT INTERVENTION;  Surgeon: Verlin Lonni BIRCH, MD;  Location: [facility] INVAS[facility] CV [facility];  Service: Cardiovascular;  Laterality: N/A;   EYE SURGERY Right    cataract   LEFT HEART CATH AND CORONARY ANGIOGRAPHY N/A 03/16/2017   Procedure: LEFT HEART CATH AND CORONARY ANGIOGRAPHY;  Surgeon: Verlin Lonni BIRCH, MD;  Location: [facility] INVASIVE CV [facility];  Service: Cardiovascular;  Laterality: N/A;   LEFT HEART CATH AND CORONARY ANGIOGRAPHY N/A 01/30/2018   Procedure: LEFT HEART CATH AND CORONARY ANGIOGRAPHY;  Surgeon: Verlin Lonni BIRCH, MD;  Location: [facility]  INVASIVE CV LAB;  Service: Cardiovascular;  Laterality: N/A;   MASS EXCISION Right 05/10/2018   Procedure: EXCISION MASS OF RIGHT PALATAL  WITH LOCAL SOFT TISSUE RECONSTRUCTION;  Surgeon: Mable Lenis, MD;  Location: [facility] OR;  Service: ENT;  Laterality: Right;   TOOTH EXTRACTION     vaginal carcinoma surgery      SOCIAL HISTORY: Social History   Socioeconomic History   Marital status: Married    Spouse name: Not on file   Number of children: 2   Years of education: Not on file   Highest education level: Not on file  Occupational History   Occupation: retired  Tobacco Use   Smoking status: Every Day    Current packs/day: 0.20    Average packs/day: 0.2 packs/day for 40.0 years (8.0 ttl pk-yrs)    Types: Cigarettes   Smokeless tobacco: Never  Vaping Use   Vaping status: Never Used  Substance and Sexual Activity   Alcohol use: Not Currently    Comment: Rarely    Drug use: No   Sexual activity: Yes  Other Topics Concern   Not on file  Social History Narrative   Lives alone.  She has two grown children.   She is retired from working at [employer].   Highest level of education:  2 year business college   Social Drivers  of Health   Financial Resource Strain: Not on file  Food Insecurity: No Food Insecurity (05/09/2023)   Hunger Vital Sign    Worried About Running Out of Food in the Last Year: Never true    Ran Out of Food in the Last Year: Never true  Transportation Needs: No Transportation Needs (05/09/2023)   PRAPARE - Administrator, Civil Service (Medical): No    Lack of Transportation (Non-Medical): No  Physical Activity: Not on file  Stress: Not on file  Social Connections: Not on file  Intimate Partner Violence: Not At Risk (05/09/2023)   Humiliation, Afraid, Rape, and Kick questionnaire    Fear of Current or Ex-Partner: No    Emotionally Abused: No    Physically Abused: No    Sexually Abused: No    FAMILY HISTORY: Family History  Problem Relation  Age of Onset   Colon polyps Mother        unknown number   Tremor Mother    COPD Mother    Colon polyps Brother        unknown number   Heart attack Brother 59   Alcohol abuse Other    Arthritis Other    Diabetes Other    Hyperlipidemia Other    Hypertension Other    Heart disease Other    Sudden death Other    Pancreatic cancer Cousin        maternal female cousin   Colon cancer Neg Hx     ALLERGIES:  is allergic to hydrocodone-acetaminophen , latex, and pneumococcal polysaccharide vaccine.  MEDICATIONS:  Current Outpatient Medications  Medication Sig Dispense Refill   acetaminophen  (TYLENOL ) 500 MG tablet Take 500 mg by mouth every 8 (eight) hours as needed (pain).     albuterol  (PROVENTIL  HFA;VENTOLIN  HFA) 108 (90 Base) MCG/ACT inhaler Inhale 2 puffs into the lungs every 6 (six) hours as needed for wheezing or shortness of breath. 1 Inhaler 5   apixaban  (ELIQUIS ) 5 MG TABS tablet Take 1 tablet (5 mg total) by mouth 2 (two) times daily. 60 tablet 6   Ascorbic Acid (VITAMIN C PO) Take 1 tablet by mouth daily.     Calcium Citrate-Vitamin D (CALCIUM + D PO) Take 1 tablet by mouth daily.     diphenhydrAMINE (BENADRYL) 25 mg capsule Take 25 mg by mouth daily as needed for allergies.     fluticasone  (FLONASE ) 50 MCG/ACT nasal spray Place 2 sprays into both nostrils daily as needed for allergies or rhinitis.     GARLIC PO Take 1 tablet by mouth 2 (two) times a week.     guaiFENesin (MUCINEX) 600 MG 12 hr tablet Take 600 mg by mouth as needed for cough or to loosen phlegm.     loperamide (IMODIUM A-D) 2 MG tablet Take 2 mg by mouth 4 (four) times daily as needed for diarrhea or Kirk stools.     melatonin 5 MG TABS Take 5 mg by mouth at bedtime.     nicotine  polacrilex (NICORETTE) 2 MG gum Take 2 mg by mouth as needed for smoking cessation.     nitroGLYCERIN  (NITROSTAT ) 0.4 MG SL tablet Place 0.4 mg under the tongue every 5 (five) minutes as needed for chest pain.     rosuvastatin  (CRESTOR) 20 MG tablet Take 20 mg by mouth daily.      tamoxifen  (NOLVADEX ) 10 MG tablet Take 1 tablet (10 mg total) by mouth daily. 90 tablet 1   tiZANidine (ZANAFLEX) 2 MG tablet  Take 2 mg by mouth 3 (three) times daily as needed for muscle spasms.     traZODone  (DESYREL ) 50 MG tablet TAKE 1/2 TO 1 TABLET BY MOUTH AT BEDTIME AS NEEDED 90 tablet 0   TURMERIC PO Take 1 tablet by mouth daily.     VITAMIN A PO Take 1 capsule by mouth daily.     VITAMIN E PO Take 1 capsule by mouth daily.     Omega-3 Fatty Acids (FISH OIL PO) Take 1 capsule by mouth 3 (three) times a week. (Patient not taking: Reported on 12/13/2023)     varenicline  (CHANTIX ) 1 MG tablet Take 1 tablet (1 mg total) by mouth 2 (two) times daily. (Patient not taking: Reported on 12/13/2023) 60 tablet 5   Varenicline  Tartrate, Starter, 0.5 MG X 11 & 1 MG X 42 TBPK Take 0.5 mg by mouth as directed. Take 0.5 mg X 3 days, then 0.5 mg twice daily (day 4-7) then 1 mg twice a day (Patient not taking: Reported on 12/13/2023) 53 each 0   No current facility-administered medications for this visit.    REVIEW OF SYSTEMS:   Constitutional: Denies fevers, chills or abnormal night sweats Eyes: Denies blurriness of vision, double vision or watery eyes Ears, nose, mouth, throat, and face: Denies mucositis or sore throat Respiratory: Denies cough, dyspnea or wheezes Cardiovascular: Denies palpitation, chest discomfort or lower extremity swelling Gastrointestinal:  Denies nausea, heartburn or change in bowel habits Skin: Denies abnormal skin rashes Lymphatics: Denies new lymphadenopathy or easy bruising Neurological:Denies numbness, tingling or new weaknesses Behavioral/Psych: Mood is stable, no new changes  Breast: Denies any palpable lumps or discharge All other systems were reviewed with the patient and are negative.  PHYSICAL EXAMINATION: ECOG PERFORMANCE STATUS: 0 - Asymptomatic  Vitals:   12/13/23 1349  BP: (!) 153/91  Pulse: (!) 53   Resp: 20  Temp: 98.6 F (37 C)  SpO2: 100%    Filed Weights   12/13/23 1349  Weight: 130 lb 14.4 oz (59.4 kg)     GENERAL:alert, no distress and comfortable,  Bilateral breasts examined. No palpable masses. No regional adenopathy Both breasts feel about the same temperature. No palpable masses. No regional adenopathy  LABORATORY DATA:  I have reviewed the data as listed Lab Results  Component Value Date   WBC 9.2 05/09/2023   HGB 15.5 (H) 05/09/2023   HCT 46.0 05/09/2023   MCV 87.3 05/09/2023   PLT 196 05/09/2023   Lab Results  Component Value Date   NA 141 05/09/2023   K 4.0 05/09/2023   CL 104 05/09/2023   CO2 30 05/09/2023    RADIOGRAPHIC STUDIES: I have personally reviewed the radiological reports and agreed with the findings in the report.  ASSESSMENT AND PLAN:  Malignant neoplasm of upper-outer quadrant of left breast in female, estrogen receptor positive (HCC) This is a very pleasant 78 year old female patient with newly diagnosed left breast grade 1 invasive ductal carcinoma, ER PR positive, HER2 0, Ki-67 of less than 1% referred to breast MDC for additional recommendations.  She is s.p lumpectomy, oncotype Dx of 7, now undergoing adj radiation. She is here now on antiestrogen therapy, tamoxifen   Assessment and Plan Assessment & Plan Breast Cancer Ongoing treatment with significant medication side effects. Addressed radiation dose concerns. Reluctant to continue mammograms. - Prescribe tamoxifen  10 mg. - Monitor for improvement in hot flashes and warmth. - Evaluate breast area for post-radiation changes. - Advise to report any fevers or chills. - Discuss  potential participation in clinical trial for fatigue management.  Atrial Fibrillation Chronic condition managed with Eliquis .    Amber Stalls MD      All questions were answered. The patient knows to call the clinic with any problems, questions or concerns.    Amber Stalls,  MD 12/13/23

## 2023-12-13 NOTE — Assessment & Plan Note (Addendum)
 This is a very pleasant 78 year old female patient with newly diagnosed left breast grade 1 invasive ductal carcinoma, ER PR positive, HER2 0, Ki-67 of less than 1% referred to breast MDC for additional recommendations.  She is s.p lumpectomy, oncotype Dx of 7, now undergoing adj radiation. She is here now on antiestrogen therapy, tamoxifen   Assessment and Plan Assessment & Plan Breast Cancer Ongoing treatment with significant medication side effects. Addressed radiation dose concerns. Reluctant to continue mammograms. - Prescribe tamoxifen  10 mg. - Monitor for improvement in hot flashes and warmth. - Evaluate breast area for post-radiation changes. - Advise to report any fevers or chills. - Discuss potential participation in clinical trial for fatigue management.  Atrial Fibrillation Chronic condition managed with Eliquis .    Amber Stalls MD

## 2023-12-14 ENCOUNTER — Telehealth: Payer: Self-pay | Admitting: Adult Health

## 2023-12-14 NOTE — Telephone Encounter (Signed)
 Spoke with patient confirming upcoming appointment

## 2023-12-18 ENCOUNTER — Telehealth: Payer: Self-pay | Admitting: Physician Assistant

## 2023-12-18 MED ORDER — TRAZODONE HCL 50 MG PO TABS: 25.0000 mg | ORAL_TABLET | Freq: Every evening | ORAL | 0 refills | Status: DC | PRN

## 2023-12-18 NOTE — Telephone Encounter (Signed)
 Pt is requesting a RF on [facility]  [id number] [place] [place] - [address]  [address], [place] She made an appt for  01/25/24

## 2023-12-18 NOTE — Telephone Encounter (Signed)
Sent for 30 day supply

## 2024-01-03 ENCOUNTER — Other Ambulatory Visit: Payer: Self-pay | Admitting: Physician Assistant

## 2024-01-25 ENCOUNTER — Ambulatory Visit: Admitting: Physician Assistant

## 2024-01-25 ENCOUNTER — Encounter: Payer: Self-pay | Admitting: Physician Assistant

## 2024-01-25 DIAGNOSIS — F99 Mental disorder, not otherwise specified: Secondary | ICD-10-CM

## 2024-01-25 DIAGNOSIS — F5105 Insomnia due to other mental disorder: Secondary | ICD-10-CM | POA: Diagnosis not present

## 2024-01-25 DIAGNOSIS — F418 Other specified anxiety disorders: Secondary | ICD-10-CM | POA: Diagnosis not present

## 2024-01-25 MED ORDER — HYDROXYZINE HCL 10 MG PO TABS: 5.0000 mg | ORAL_TABLET | Freq: Three times a day (TID) | ORAL | 1 refills | Status: AC | PRN

## 2024-01-25 MED ORDER — TRAZODONE HCL 50 MG PO TABS: 25.0000 mg | ORAL_TABLET | Freq: Every evening | ORAL | 3 refills | Status: AC | PRN
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
## 2024-01-27 ENCOUNTER — Other Ambulatory Visit: Payer: Self-pay | Admitting: Cardiology

## 2024-01-28 NOTE — Telephone Encounter (Signed)
 Prescription refill request for Eliquis  received. Indication:palps Last office visit:1/25 Scr:0.76  12/24 Age: 78 Weight:59.4  kg  Prescription refilled

## 2024-03-03 ENCOUNTER — Encounter: Payer: Self-pay | Admitting: Adult Health

## 2024-03-03 ENCOUNTER — Inpatient Hospital Stay: Attending: Hematology and Oncology | Admitting: Adult Health

## 2024-03-03 VITALS — BP 131/69 | HR 46 | Temp 98.2°F | Resp 15 | Wt 133.1 lb

## 2024-03-03 DIAGNOSIS — F17210 Nicotine dependence, cigarettes, uncomplicated: Secondary | ICD-10-CM | POA: Diagnosis not present

## 2024-03-03 DIAGNOSIS — M810 Age-related osteoporosis without current pathological fracture: Secondary | ICD-10-CM | POA: Diagnosis not present

## 2024-03-03 DIAGNOSIS — Z7901 Long term (current) use of anticoagulants: Secondary | ICD-10-CM | POA: Insufficient documentation

## 2024-03-03 DIAGNOSIS — Z79810 Long term (current) use of selective estrogen receptor modulators (SERMs): Secondary | ICD-10-CM | POA: Insufficient documentation

## 2024-03-03 DIAGNOSIS — Z923 Personal history of irradiation: Secondary | ICD-10-CM | POA: Insufficient documentation

## 2024-03-03 DIAGNOSIS — Z170 Estrogen receptor positive status [ER+]: Secondary | ICD-10-CM | POA: Insufficient documentation

## 2024-03-03 DIAGNOSIS — I482 Chronic atrial fibrillation, unspecified: Secondary | ICD-10-CM | POA: Diagnosis not present

## 2024-03-03 DIAGNOSIS — E039 Hypothyroidism, unspecified: Secondary | ICD-10-CM | POA: Insufficient documentation

## 2024-03-03 DIAGNOSIS — C50412 Malignant neoplasm of upper-outer quadrant of left female breast: Secondary | ICD-10-CM | POA: Diagnosis not present

## 2024-03-03 DIAGNOSIS — Z800 Family history of malignant neoplasm of digestive organs: Secondary | ICD-10-CM | POA: Insufficient documentation

## 2024-03-03 NOTE — Progress Notes (Signed)
 SURVIVORSHIP VISIT:  BRIEF ONCOLOGIC HISTORY:  Oncology History  Malignant neoplasm of upper-outer quadrant of left breast in female, estrogen receptor positive (HCC)  03/28/2023 Mammogram   Screening mammogram showed focal asymmetry in the left breast, indeterminate, additional views with possible ultrasound are recommended.  Diagnostic mammogram confirmed a 7 mm focal asymmetry in the left breast.   04/25/2023 Pathology Results   Left breast needle core biopsy, mass at 3:00 5 cm from the nipple showed overall grade 1 invasive ductal carcinoma, prognostic showed estrogen 95% positive strong staining, progesterone 100% positive strong staining, Ki-67 of less than 1% and HER2 0   05/07/2023 Initial Diagnosis   Malignant neoplasm of upper-outer quadrant of left breast in female, estrogen receptor positive (HCC)   05/09/2023 Cancer Staging   Staging form: Breast, AJCC 8th Edition - Clinical stage from 05/09/2023: Stage IA (cT1a, cN0, cM0, G1, ER+, PR+, HER2-) - Signed by Loretha Ash, MD on 05/09/2023 Stage prefix: Initial diagnosis Histologic grading system: 3 grade system   06/08/2023 Definitive Surgery   Left breast lumpectomy showed grade 1 IDC, 1.7 cms, negative margins, ER 95% pos strong staining, PR 100% strong staining, Her 2 neg, Ki 67 1%    06/15/2023 Oncotype testing   Oncotype Dx of 7, no role for adjuvant chemotherapy.   07/04/2023 Cancer Staging   Staging form: Breast, AJCC 8th Edition - Pathologic stage from 07/04/2023: Stage IA (pT1c, pN0, cM0, G1, ER+, PR+, HER2-) - Signed by Wyatt Leeroy HERO, PA-C on 07/04/2023 Stage prefix: Initial diagnosis Multigene prognostic tests performed: Oncotype DX Histologic grading system: 3 grade system   07/16/2023 - 08/14/2023 Radiation Therapy   Plan Name: Breast_L_BH Site: Breast, Left Technique: 3D Mode: Photon Dose Per Fraction: 2.67 Gy Prescribed Dose (Delivered / Prescribed): 40.05 Gy / 40.05 Gy Prescribed Fxs (Delivered /  Prescribed): 15 / 15   Plan Name: Brst_L_Bst Site: Breast, Left Technique: Electron Mode: Electron Dose Per Fraction: 2 Gy Prescribed Dose (Delivered / Prescribed): 12 Gy / 12 Gy Prescribed Fxs (Delivered / Prescribed): 6 / 6   11/2023 -  Anti-estrogen oral therapy   Tamoxifen      INTERVAL HISTORY:  Discussed the use of AI scribe software for clinical note transcription with the patient, who gave verbal consent to proceed.  History of Present Illness Casey Kirk is a 78 year old female with stage 1A breast cancer who presents for follow-up care.  She is on antiestrogen therapy, adjusted to a half dose due to side effects, including hot flushes and mood changes. She takes Eliquis  for atrial fibrillation to reduce the risk of blood clots associated with her current medication.  She has declined genetic testing for breast cancer. She is not ready for a mammogram at this time due to breast soreness.  She was recommended to use diclofenac  gel which has helped some and she continues this.    REVIEW OF SYSTEMS:  Review of Systems  Constitutional:  Negative for appetite change, chills, fatigue, fever and unexpected weight change.  HENT:   Negative for hearing loss, lump/mass and trouble swallowing.   Eyes:  Negative for eye problems and icterus.  Respiratory:  Negative for chest tightness, cough and shortness of breath.   Cardiovascular:  Negative for chest pain, leg swelling and palpitations.  Gastrointestinal:  Negative for abdominal distention, abdominal pain, constipation, diarrhea, nausea and vomiting.  Endocrine: Positive for hot flashes.  Genitourinary:  Negative for difficulty urinating.   Musculoskeletal:  Negative for arthralgias.  Skin:  Negative  for itching and rash.  Neurological:  Negative for dizziness, extremity weakness, headaches and numbness.  Hematological:  Negative for adenopathy. Does not bruise/bleed easily.  Psychiatric/Behavioral:  Negative for depression.  The patient is not nervous/anxious.    Breast: Denies any new nodularity, masses, tenderness, nipple changes, or nipple discharge.       PAST MEDICAL/SURGICAL HISTORY:  Past Medical History:  Diagnosis Date   Allergic rhinitis    Aortic atherosclerosis 08/17/2016   Arthritis    ARTHRITIS, HX OF 01/09/2007   BIPOLAR II DISORDER 03/17/2008   Breast cancer (HCC)    Carcinoma of vaginal vault (HCC)    COPD 12/21/2006   COPD (chronic obstructive pulmonary disease) (HCC) 12/21/2006   Emphysema on CT 06/2016 02/2016 FEV1 of 98%     Coronary artery calcification 08/17/2016   Coronary artery disease involving native coronary artery of native heart with unstable angina pectoris (HCC)    Dysphasia 05/10/2011   Family history of early CAD 08/17/2016   GERD (gastroesophageal reflux disease)    GOUT 01/09/2007   History of radiation therapy    Left breast-07/16/23-08/14/23- Dr. Lynwood Nasuti   Hx of colonic polyps    Hypertension    Hypothyroidism 10/17/2010   IBS (irritable bowel syndrome)    Osteoporosis    Sinusitis    Thyroid  disease    TINEA CORPORIS 10/06/2009   Tobacco abuse 05/10/2011   Past Surgical History:  Procedure Laterality Date   ABDOMINAL HYSTERECTOMY     CA cells removed end of vagina   BREAST LUMPECTOMY WITH RADIOACTIVE SEED LOCALIZATION Left 06/08/2023   Procedure: LEFT BREAST LUMPECTOMY WITH RADIOACTIVE SEED LOCALIZATION;  Surgeon: Ebbie Cough, MD;  Location: [facility] OR;  Service: General;  Laterality: Left;   BREAST SURGERY     biopsy   COLONOSCOPY     CORONARY PRESSURE/FFR STUDY N/A 03/16/2017   Procedure: INTRAVASCULAR PRESSURE WIRE/FFR STUDY;  Surgeon: Verlin Lonni BIRCH, MD;  Location: [facility];  Service: Cardiovascular;  Laterality: N/A;   CORONARY STENT INTERVENTION N/A 03/16/2017   Procedure: CORONARY STENT INTERVENTION;  Surgeon: Verlin Lonni BIRCH, MD;  Location: [facility];  Service: Cardiovascular;  Laterality: N/A;    EYE SURGERY Right    cataract   LEFT HEART CATH AND CORONARY ANGIOGRAPHY N/A 03/16/2017   Procedure: LEFT HEART CATH AND CORONARY ANGIOGRAPHY;  Surgeon: Verlin Lonni BIRCH, MD;  Location: [facility];  Service: Cardiovascular;  Laterality: N/A;   LEFT HEART CATH AND CORONARY ANGIOGRAPHY N/A 01/30/2018   Procedure: LEFT HEART CATH AND CORONARY ANGIOGRAPHY;  Surgeon: Verlin Lonni BIRCH, MD;  Location: [facility];  Service: Cardiovascular;  Laterality: N/A;   MASS EXCISION Right 05/10/2018   Procedure: EXCISION MASS OF RIGHT PALATAL  WITH LOCAL SOFT TISSUE RECONSTRUCTION;  Surgeon: Mable Lenis, MD;  Location: [facility] OR;  Service: ENT;  Laterality: Right;   TOOTH EXTRACTION     vaginal carcinoma surgery       ALLERGIES:  Allergies  Allergen Reactions   Hydrocodone-Acetaminophen  Itching and Swelling   Latex Itching and Swelling   Pneumococcal Polysaccharide Vaccine Swelling    Fever     CURRENT MEDICATIONS:  Outpatient Encounter Medications as of 03/03/2024  Medication Sig   acetaminophen  (TYLENOL ) 500 MG tablet Take 500 mg by mouth every 8 (eight) hours as needed (pain).   albuterol  (PROVENTIL  HFA;VENTOLIN  HFA) 108 (90 Base) MCG/ACT inhaler Inhale 2 puffs into the lungs every 6 (six) hours as needed for wheezing or shortness  of breath.   Ascorbic Acid (VITAMIN C PO) Take 1 tablet by mouth daily.   Calcium Citrate-Vitamin D (CALCIUM + D PO) Take 1 tablet by mouth daily.   diphenhydrAMINE (BENADRYL) 25 mg capsule Take 25 mg by mouth daily as needed for allergies.   ELIQUIS  5 MG TABS tablet TAKE 1 TABLET BY MOUTH TWICE A DAY   fluticasone  (FLONASE ) 50 MCG/ACT nasal spray Place 2 sprays into both nostrils daily as needed for allergies or rhinitis.   GARLIC PO Take 1 tablet by mouth 2 (two) times a week.   guaiFENesin (MUCINEX) 600 MG 12 hr tablet Take 600 mg by mouth as needed for cough or to loosen phlegm.   hydrOXYzine  (ATARAX ) 10 MG tablet Take 0.5-1 tablets  (5-10 mg total) by mouth every 8 (eight) hours as needed.   loperamide (IMODIUM A-D) 2 MG tablet Take 2 mg by mouth 4 (four) times daily as needed for diarrhea or loose stools.   melatonin 5 MG TABS Take 5 mg by mouth at bedtime.   nicotine  polacrilex (NICORETTE) 2 MG gum Take 2 mg by mouth as needed for smoking cessation.   nitroGLYCERIN  (NITROSTAT ) 0.4 MG SL tablet Place 0.4 mg under the tongue every 5 (five) minutes as needed for chest pain.   rosuvastatin (CRESTOR) 20 MG tablet Take 20 mg by mouth daily.    tamoxifen  (NOLVADEX ) 10 MG tablet Take 1 tablet (10 mg total) by mouth daily.   tiZANidine (ZANAFLEX) 2 MG tablet Take 2 mg by mouth 3 (three) times daily as needed for muscle spasms.   traZODone  (DESYREL ) 50 MG tablet Take 0.5-1 tablets (25-50 mg total) by mouth at bedtime as needed.   varenicline  (CHANTIX ) 1 MG tablet Take 1 tablet (1 mg total) by mouth 2 (two) times daily.   Varenicline  Tartrate, Starter, 0.5 MG X 11 & 1 MG X 42 TBPK Take 0.5 mg by mouth as directed. Take 0.5 mg X 3 days, then 0.5 mg twice daily (day 4-7) then 1 mg twice a day   VITAMIN A PO Take 1 capsule by mouth daily.   Omega-3 Fatty Acids (FISH OIL PO) Take 1 capsule by mouth 3 (three) times a week. (Patient not taking: Reported on 03/03/2024)   TURMERIC PO Take 1 tablet by mouth daily. (Patient not taking: Reported on 03/03/2024)   VITAMIN E PO Take 1 capsule by mouth daily. (Patient not taking: Reported on 03/03/2024)   No facility-administered encounter medications on file as of 03/03/2024.     ONCOLOGIC FAMILY HISTORY:  Family History  Problem Relation Age of Onset   Colon polyps Mother        unknown number   Tremor Mother    COPD Mother    Colon polyps Brother        unknown number   Heart attack Brother 46   Alcohol abuse Other    Arthritis Other    Diabetes Other    Hyperlipidemia Other    Hypertension Other    Heart disease Other    Sudden death Other    Pancreatic cancer Cousin         maternal female cousin   Colon cancer Neg Hx      SOCIAL HISTORY:  Social History   Socioeconomic History   Marital status: Married    Spouse name: Not on file   Number of children: 2   Years of education: Not on file   Highest education level: Not on file  Occupational History  Occupation: retired  Tobacco Use   Smoking status: Every Day    Current packs/day: 0.20    Average packs/day: 0.2 packs/day for 40.0 years (8.0 ttl pk-yrs)    Types: Cigarettes   Smokeless tobacco: Never  Vaping Use   Vaping status: Never Used  Substance and Sexual Activity   Alcohol use: Not Currently    Comment: Rarely    Drug use: No   Sexual activity: Yes  Other Topics Concern   Not on file  Social History Narrative   Lives alone.  She has two grown children.   She is retired from working at [employer].   Highest level of education:  2 year business college   Social Drivers of Health   Financial Resource Strain: Not on file  Food Insecurity: No Food Insecurity (05/09/2023)   Hunger Vital Sign    Worried About Running Out of Food in the Last Year: Never true    Ran Out of Food in the Last Year: Never true  Transportation Needs: No Transportation Needs (05/09/2023)   PRAPARE - Administrator, Civil Service (Medical): No    Lack of Transportation (Non-Medical): No  Physical Activity: Not on file  Stress: Not on file  Social Connections: Not on file  Intimate Partner Violence: Not At Risk (05/09/2023)   Humiliation, Afraid, Rape, and Kick questionnaire    Fear of Current or Ex-Partner: No    Emotionally Abused: No    Physically Abused: No    Sexually Abused: No     OBSERVATIONS/OBJECTIVE:  BP 131/69 (BP Location: Left Arm, Patient Position: Sitting)   Pulse (!) 46   Temp 98.2 F (36.8 C) (Temporal)   Resp 15   Wt 133 lb 1.6 oz (60.4 kg)   SpO2 98%   BMI 24.34 kg/m  GENERAL: Patient is a well appearing female in no acute distress HEENT:  Sclerae anicteric.  Oropharynx  clear and moist. No ulcerations or evidence of oropharyngeal candidiasis. Neck is supple.  NODES:  No cervical, supraclavicular, or axillary lymphadenopathy palpated.  BREAST EXAM:left breast s/p lumpectomy and radiation, no sign of local recurrence, right breast benign LUNGS:  Clear to auscultation bilaterally.  No wheezes or rhonchi. HEART:  Regular rate and rhythm. No murmur appreciated. ABDOMEN:  Soft, nontender.  Positive, normoactive bowel sounds. No organomegaly palpated. MSK:  No focal spinal tenderness to palpation. Full range of motion bilaterally in the upper extremities. EXTREMITIES:  No peripheral edema.   SKIN:  Clear with no obvious rashes or skin changes. No nail dyscrasia. NEURO:  Nonfocal. Well oriented.  Appropriate affect.   LABORATORY DATA:  None for this visit.  DIAGNOSTIC IMAGING:  None for this visit.      ASSESSMENT AND PLAN:  Casey Kirk is a pleasant 78 y.o. female with Stage IA left breast invasive ductal carcinoma, ER+/PR+/HER2-, diagnosed in 04/2023, treated with lumpectomy, adjuvant radiation therapy, and anti-estrogen therapy with Tamoxifen  beginning in 11/2023.  She presents to the Survivorship Clinic for our initial meeting and routine follow-up post-completion of treatment for breast cancer.    1. Stage IA left breast cancer:  Casey Kirk is continuing to recover from definitive treatment for breast cancer. She will follow-up with her medical oncologist, Dr.  Loretha in 6 months with history and physical exam per surveillance protocol.  She will continue her anti-estrogen therapy with Tamoxifen . Thus far, she is tolerating the Tamoxifen  well at half dose experiencing manageable hot flashes. Her mammogram is due 03/2024; she declines  undergoing mammogram again at this point.  She says that she has too much breast pain and would like to wait until January when she has f/u with Dr. Ebbie and address again at that time.  Today, a comprehensive survivorship  care plan and treatment summary was reviewed with the patient today detailing her breast cancer diagnosis, treatment course, potential late/long-term effects of treatment, appropriate follow-up care with recommendations for the future, and patient education resources.  A copy of this summary, along with a letter will be sent to the patient's primary care provider via mail/fax/In Basket message after today's visit.    2. Bone health:  She was given education on specific activities to promote bone health.  3. Cancer screening:  Due to Casey Kirk's history and her age, she should receive screening for skin cancers, colon cancer, and gynecologic cancers.  The information and recommendations are listed on the patient's comprehensive care plan/treatment summary and were reviewed in detail with the patient.    4. Health maintenance and wellness promotion: Casey Kirk was encouraged to consume 5-7 servings of fruits and vegetables per day. We reviewed the [employer] handout.  She was also encouraged to engage in moderate to vigorous exercise for 30 minutes per day most days of the week.  She was instructed to limit her alcohol consumption and continue to abstain from tobacco use.     5. Support services/counseling: It is not uncommon for this period of the patient's cancer care trajectory to be one of many emotions and stressors.   She was given information regarding our available services and encouraged to contact me with any questions or for help enrolling in any of our support group/programs.    Follow up instructions:    -Return to cancer center in 08/2024 for f/u with Dr. Loretha  -See Dr. Ebbie 05/2024 -She is welcome to return back to the Survivorship Clinic at any time; no additional follow-up needed at this time.  -Consider referral back to survivorship as a long-term survivor for continued surveillance  The patient was provided an opportunity to ask questions and all were answered. The  patient agreed with the plan and demonstrated an understanding of the instructions.   Total encounter time:45 minutes*in face-to-face visit time, chart review, lab review, care coordination, order entry, and documentation of the encounter time.    Morna Kendall, NP 03/03/24 2:50 PM Medical Oncology and Hematology [facility] [address] [place], [place] [address] Tel. [phone]    Fax. [phone]  *Total Encounter Time as defined by the Centers for Medicare and Medicaid Services includes, in addition to the face-to-face time of a patient visit (documented in the note above) non-face-to-face time: obtaining and reviewing outside history, ordering and reviewing medications, tests or procedures, care coordination (communications with other health care professionals or caregivers) and documentation in the medical record.

## 2024-03-12 DIAGNOSIS — L821 Other seborrheic keratosis: Secondary | ICD-10-CM | POA: Diagnosis not present

## 2024-03-12 DIAGNOSIS — D225 Melanocytic nevi of trunk: Secondary | ICD-10-CM | POA: Diagnosis not present

## 2024-03-12 DIAGNOSIS — Z85828 Personal history of other malignant neoplasm of skin: Secondary | ICD-10-CM | POA: Diagnosis not present

## 2024-03-18 ENCOUNTER — Ambulatory Visit: Admitting: Physician Assistant

## 2024-03-18 ENCOUNTER — Encounter: Payer: Self-pay | Admitting: Physician Assistant

## 2024-03-18 DIAGNOSIS — F418 Other specified anxiety disorders: Secondary | ICD-10-CM | POA: Diagnosis not present

## 2024-03-18 DIAGNOSIS — F5105 Insomnia due to other mental disorder: Secondary | ICD-10-CM | POA: Diagnosis not present

## 2024-03-18 DIAGNOSIS — F99 Mental disorder, not otherwise specified: Secondary | ICD-10-CM | POA: Diagnosis not present

## 2024-03-18 DIAGNOSIS — F17200 Nicotine dependence, unspecified, uncomplicated: Secondary | ICD-10-CM

## 2024-03-18 NOTE — Progress Notes (Signed)
 [facility] Med Check  Patient ID: Casey Kirk,  MRN: [id number]  PCP: Okey Carlin Redbird, MD  Date of Evaluation: 03/18/2024 Time spent:20 minutes  Chief Complaint:  Chief Complaint   Follow-up    HISTORY/CURRENT STATUS: HPI For 6-week med check.  At the visit hydroxyzine  was added due to anxiety.  It has been helpful.  She has not taken that often but it is effective when needed.  She is sleeping pretty well.  Things have finally calmed down some at home and she is getting things back in order.  Energy and motivation are good.   No extreme sadness, tearfulness, or feelings of hopelessness.   ADLs and personal hygiene are normal.   Denies any changes in concentration, making decisions, or remembering things.  Appetite has not changed.  Weight is stable.  No mania, delirium, AH/VH.  No SI/HI.  Individual Medical History/ Review of Systems: Changes? :No     Past medications for mental health diagnoses include: Lamictal , Trazodone   Allergies: Hydrocodone-acetaminophen , Latex, and Pneumococcal polysaccharide vaccine  Current Medications:  Current Outpatient Medications:    acetaminophen  (TYLENOL ) 500 MG tablet, Take 500 mg by mouth every 8 (eight) hours as needed (pain)., Disp: , Rfl:    albuterol  (PROVENTIL  HFA;VENTOLIN  HFA) 108 (90 Base) MCG/ACT inhaler, Inhale 2 puffs into the lungs every 6 (six) hours as needed for wheezing or shortness of breath., Disp: 1 Inhaler, Rfl: 5   Ascorbic Acid (VITAMIN C PO), Take 1 tablet by mouth daily., Disp: , Rfl:    Calcium Citrate-Vitamin D (CALCIUM + D PO), Take 1 tablet by mouth daily., Disp: , Rfl:    diphenhydrAMINE (BENADRYL) 25 mg capsule, Take 25 mg by mouth daily as needed for allergies., Disp: , Rfl:    ELIQUIS  5 MG TABS tablet, TAKE 1 TABLET BY MOUTH TWICE A DAY, Disp: 60 tablet, Rfl: 6   fluticasone  (FLONASE ) 50 MCG/ACT nasal spray, Place 2 sprays into both nostrils daily as needed for allergies or rhinitis., Disp: , Rfl:     GARLIC PO, Take 1 tablet by mouth 2 (two) times a week., Disp: , Rfl:    guaiFENesin (MUCINEX) 600 MG 12 hr tablet, Take 600 mg by mouth as needed for cough or to loosen phlegm., Disp: , Rfl:    hydrOXYzine  (ATARAX ) 10 MG tablet, Take 0.5-1 tablets (5-10 mg total) by mouth every 8 (eight) hours as needed., Disp: 30 tablet, Rfl: 1   loperamide (IMODIUM A-D) 2 MG tablet, Take 2 mg by mouth 4 (four) times daily as needed for diarrhea or loose stools., Disp: , Rfl:    melatonin 5 MG TABS, Take 5 mg by mouth at bedtime., Disp: , Rfl:    rosuvastatin (CRESTOR) 20 MG tablet, Take 20 mg by mouth daily. , Disp: , Rfl:    tamoxifen  (NOLVADEX ) 10 MG tablet, Take 1 tablet (10 mg total) by mouth daily. (Patient taking differently: Take 5 mg by mouth daily.), Disp: 90 tablet, Rfl: 1   tiZANidine (ZANAFLEX) 2 MG tablet, Take 2 mg by mouth 3 (three) times daily as needed for muscle spasms., Disp: , Rfl:    traZODone  (DESYREL ) 50 MG tablet, Take 0.5-1 tablets (25-50 mg total) by mouth at bedtime as needed., Disp: 90 tablet, Rfl: 3   VITAMIN A PO, Take 1 capsule by mouth daily., Disp: , Rfl:    nicotine  polacrilex (NICORETTE) 2 MG gum, Take 2 mg by mouth as needed for smoking cessation., Disp: , Rfl:    nitroGLYCERIN  (NITROSTAT )  0.4 MG SL tablet, Place 0.4 mg under the tongue every 5 (five) minutes as needed for chest pain., Disp: , Rfl:    Omega-3 Fatty Acids (FISH OIL PO), Take 1 capsule by mouth 3 (three) times a week. (Patient not taking: Reported on 03/03/2024), Disp: , Rfl:    TURMERIC PO, Take 1 tablet by mouth daily. (Patient not taking: Reported on 03/03/2024), Disp: , Rfl:    varenicline  (CHANTIX ) 1 MG tablet, Take 1 tablet (1 mg total) by mouth 2 (two) times daily. (Patient not taking: Reported on 03/18/2024), Disp: 60 tablet, Rfl: 5   Varenicline  Tartrate, Starter, 0.5 MG X 11 & 1 MG X 42 TBPK, Take 0.5 mg by mouth as directed. Take 0.5 mg X 3 days, then 0.5 mg twice daily (day 4-7) then 1 mg twice a day  (Patient not taking: Reported on 03/18/2024), Disp: 53 each, Rfl: 0   VITAMIN E PO, Take 1 capsule by mouth daily. (Patient not taking: Reported on 03/03/2024), Disp: , Rfl:  Medication Side Effects: none  Family Medical/ Social History: Changes? no  MENTAL HEALTH EXAM:  There were no vitals taken for this visit.There is no height or weight on file to calculate BMI.  General Appearance: Casual and Well Groomed  Eye Contact:  Good  Speech:  Clear and Coherent and voice is tremulous  Volume:  Normal  Mood:  Euthymic  Affect:  Appropriate  Thought Process:  Goal Directed and Descriptions of Associations: Circumstantial  Orientation:  Full (Time, Place, and Person)  Thought Content: Logical   Suicidal Thoughts:  No  Homicidal Thoughts:  No  Memory:  WNL  Judgement:  Good  Insight:  Good  Psychomotor Activity:  Normal  Concentration:  Concentration: Good and Attention Span: Good  Recall:  Good  Fund of Knowledge: Good  Language: Good  Assets:  Desire for Improvement  ADL's:  Intact  Cognition: WNL  Prognosis:  Good   DIAGNOSES:    ICD-10-CM   1. Situational anxiety  F41.8     2. Insomnia due to other mental disorder  F51.05    F99     3. Smoker  F17.200       Receiving Psychotherapy: No   RECOMMENDATIONS:  PDMP was reviewed.  2 Valium  filled 03/07/2023. I provided approximately  20 minutes of face to face time during this encounter, including time spent before and after the visit in records review, medical decision making, counseling pertinent to today's visit, and charting.   She is doing better.  No change in treatment.  We again discussed smoking cessation.  She uses Nicorette gum which helps some.  She has tried Chantix  without success and does not want to make any changes.  Continue hydroxyzine  10 mg, 1/2-1 3 times daily as needed anxiety. Continue trazodone  50 mg, 1/2-1 p.o. nightly as needed. Continue melatonin 3 mg 1 p.o. nightly as needed. Return in  1  year.  Verneita Cooks, PA-C

## 2024-04-30 ENCOUNTER — Encounter (HOSPITAL_BASED_OUTPATIENT_CLINIC_OR_DEPARTMENT_OTHER): Payer: Self-pay | Admitting: Cardiology

## 2024-05-05 ENCOUNTER — Telehealth: Payer: Self-pay | Admitting: Hematology and Oncology

## 2024-05-05 NOTE — Telephone Encounter (Signed)
 I left a voicemail for patient regarding the rescheduling of her 06/16/24 appt, changed to 06/18/24.

## 2024-05-29 ENCOUNTER — Encounter: Payer: Self-pay | Admitting: Adult Health

## 2024-06-16 ENCOUNTER — Ambulatory Visit: Admitting: Hematology and Oncology

## 2024-06-17 ENCOUNTER — Telehealth: Payer: Self-pay | Admitting: Hematology and Oncology

## 2024-06-17 NOTE — Telephone Encounter (Signed)
 I spoke with patient and she is aware of rescheduled appointment from 06/18/2024 to 07/04/2024.

## 2024-06-18 ENCOUNTER — Inpatient Hospital Stay: Admitting: Hematology and Oncology

## 2024-07-03 ENCOUNTER — Ambulatory Visit (HOSPITAL_BASED_OUTPATIENT_CLINIC_OR_DEPARTMENT_OTHER): Admitting: Cardiology

## 2024-07-04 ENCOUNTER — Inpatient Hospital Stay: Attending: Hematology and Oncology | Admitting: Hematology and Oncology

## 2024-09-02 ENCOUNTER — Inpatient Hospital Stay: Attending: Hematology and Oncology | Admitting: Hematology and Oncology

## 2025-03-18 ENCOUNTER — Ambulatory Visit: Admitting: Physician Assistant
